# Patient Record
Sex: Female | Born: 1950 | Race: White | Hispanic: No | Marital: Married | Smoking: Current every day smoker
Health system: Southern US, Community
[De-identification: ages and names within clinical notes are randomized; demographics above are authoritative.]

## PROBLEM LIST (undated history)

## (undated) DIAGNOSIS — E785 Hyperlipidemia, unspecified: Secondary | ICD-10-CM

## (undated) DIAGNOSIS — I10 Essential (primary) hypertension: Secondary | ICD-10-CM

## (undated) DIAGNOSIS — C801 Malignant (primary) neoplasm, unspecified: Secondary | ICD-10-CM

## (undated) HISTORY — DX: Essential (primary) hypertension: I10

## (undated) HISTORY — DX: Hyperlipidemia, unspecified: E78.5

---

## 1997-07-13 ENCOUNTER — Other Ambulatory Visit: Admission: RE | Admit: 1997-07-13 | Discharge: 1997-07-13 | Payer: Self-pay | Admitting: Dermatology

## 1997-08-25 ENCOUNTER — Other Ambulatory Visit: Admission: RE | Admit: 1997-08-25 | Discharge: 1997-08-25 | Payer: Self-pay | Admitting: Obstetrics and Gynecology

## 1999-02-15 ENCOUNTER — Encounter: Payer: Self-pay | Admitting: Internal Medicine

## 1999-02-15 ENCOUNTER — Encounter: Admission: RE | Admit: 1999-02-15 | Discharge: 1999-02-15 | Payer: Self-pay | Admitting: Obstetrics and Gynecology

## 1999-02-15 ENCOUNTER — Encounter: Payer: Self-pay | Admitting: Obstetrics and Gynecology

## 2000-03-01 ENCOUNTER — Encounter: Payer: Self-pay | Admitting: Obstetrics and Gynecology

## 2000-03-01 ENCOUNTER — Encounter: Admission: RE | Admit: 2000-03-01 | Discharge: 2000-03-01 | Payer: Self-pay | Admitting: Obstetrics and Gynecology

## 2001-05-22 ENCOUNTER — Encounter: Payer: Self-pay | Admitting: Obstetrics and Gynecology

## 2001-05-22 ENCOUNTER — Encounter: Admission: RE | Admit: 2001-05-22 | Discharge: 2001-05-22 | Payer: Self-pay | Admitting: Obstetrics and Gynecology

## 2001-06-04 ENCOUNTER — Other Ambulatory Visit: Admission: RE | Admit: 2001-06-04 | Discharge: 2001-06-04 | Payer: Self-pay | Admitting: Gynecology

## 2002-05-27 ENCOUNTER — Encounter: Admission: RE | Admit: 2002-05-27 | Discharge: 2002-05-27 | Payer: Self-pay | Admitting: Gynecology

## 2002-05-27 ENCOUNTER — Encounter: Payer: Self-pay | Admitting: Gynecology

## 2002-06-03 ENCOUNTER — Other Ambulatory Visit: Admission: RE | Admit: 2002-06-03 | Discharge: 2002-06-03 | Payer: Self-pay | Admitting: Gynecology

## 2003-05-29 ENCOUNTER — Encounter: Admission: RE | Admit: 2003-05-29 | Discharge: 2003-05-29 | Payer: Self-pay | Admitting: Gynecology

## 2003-06-16 ENCOUNTER — Other Ambulatory Visit: Admission: RE | Admit: 2003-06-16 | Discharge: 2003-06-16 | Payer: Self-pay | Admitting: Gynecology

## 2004-06-17 ENCOUNTER — Encounter: Admission: RE | Admit: 2004-06-17 | Discharge: 2004-06-17 | Payer: Self-pay | Admitting: Internal Medicine

## 2004-07-05 ENCOUNTER — Other Ambulatory Visit: Admission: RE | Admit: 2004-07-05 | Discharge: 2004-07-05 | Payer: Self-pay | Admitting: Gynecology

## 2005-07-24 ENCOUNTER — Other Ambulatory Visit: Admission: RE | Admit: 2005-07-24 | Discharge: 2005-07-24 | Payer: Self-pay | Admitting: Gynecology

## 2005-07-26 ENCOUNTER — Ambulatory Visit (HOSPITAL_COMMUNITY): Admission: RE | Admit: 2005-07-26 | Discharge: 2005-07-26 | Payer: Self-pay | Admitting: Gynecology

## 2005-08-31 ENCOUNTER — Encounter: Admission: RE | Admit: 2005-08-31 | Discharge: 2005-08-31 | Payer: Self-pay | Admitting: Gynecology

## 2005-11-23 ENCOUNTER — Ambulatory Visit (HOSPITAL_COMMUNITY): Admission: RE | Admit: 2005-11-23 | Discharge: 2005-11-23 | Payer: Self-pay | Admitting: Gynecology

## 2006-07-02 ENCOUNTER — Ambulatory Visit (HOSPITAL_COMMUNITY): Admission: RE | Admit: 2006-07-02 | Discharge: 2006-07-02 | Payer: Self-pay | Admitting: Gynecology

## 2006-08-13 ENCOUNTER — Other Ambulatory Visit: Admission: RE | Admit: 2006-08-13 | Discharge: 2006-08-13 | Payer: Self-pay | Admitting: Gynecology

## 2006-10-08 ENCOUNTER — Encounter: Admission: RE | Admit: 2006-10-08 | Discharge: 2006-10-08 | Payer: Self-pay | Admitting: Gynecology

## 2007-01-07 ENCOUNTER — Ambulatory Visit (HOSPITAL_COMMUNITY): Admission: RE | Admit: 2007-01-07 | Discharge: 2007-01-07 | Payer: Self-pay | Admitting: Gynecology

## 2009-04-29 ENCOUNTER — Encounter: Admission: RE | Admit: 2009-04-29 | Discharge: 2009-04-29 | Payer: Self-pay | Admitting: Gynecology

## 2010-04-03 ENCOUNTER — Encounter: Payer: Self-pay | Admitting: Gynecology

## 2012-02-23 ENCOUNTER — Other Ambulatory Visit: Payer: Self-pay | Admitting: Gynecology

## 2012-02-23 DIAGNOSIS — Z1231 Encounter for screening mammogram for malignant neoplasm of breast: Secondary | ICD-10-CM

## 2012-04-01 ENCOUNTER — Ambulatory Visit: Payer: Self-pay

## 2013-07-17 DIAGNOSIS — G4700 Insomnia, unspecified: Secondary | ICD-10-CM | POA: Insufficient documentation

## 2013-10-03 DIAGNOSIS — J309 Allergic rhinitis, unspecified: Secondary | ICD-10-CM | POA: Insufficient documentation

## 2013-10-03 DIAGNOSIS — I10 Essential (primary) hypertension: Secondary | ICD-10-CM | POA: Insufficient documentation

## 2013-10-03 DIAGNOSIS — K219 Gastro-esophageal reflux disease without esophagitis: Secondary | ICD-10-CM | POA: Insufficient documentation

## 2013-10-03 DIAGNOSIS — F17200 Nicotine dependence, unspecified, uncomplicated: Secondary | ICD-10-CM | POA: Insufficient documentation

## 2013-10-03 DIAGNOSIS — F419 Anxiety disorder, unspecified: Secondary | ICD-10-CM | POA: Insufficient documentation

## 2016-04-11 DIAGNOSIS — K5790 Diverticulosis of intestine, part unspecified, without perforation or abscess without bleeding: Secondary | ICD-10-CM | POA: Insufficient documentation

## 2016-10-02 DIAGNOSIS — E781 Pure hyperglyceridemia: Secondary | ICD-10-CM | POA: Diagnosis not present

## 2016-10-02 DIAGNOSIS — I10 Essential (primary) hypertension: Secondary | ICD-10-CM | POA: Diagnosis not present

## 2016-10-18 DIAGNOSIS — E785 Hyperlipidemia, unspecified: Secondary | ICD-10-CM | POA: Diagnosis not present

## 2016-10-18 DIAGNOSIS — I10 Essential (primary) hypertension: Secondary | ICD-10-CM | POA: Diagnosis not present

## 2016-10-18 DIAGNOSIS — D259 Leiomyoma of uterus, unspecified: Secondary | ICD-10-CM | POA: Diagnosis not present

## 2016-10-18 DIAGNOSIS — K5792 Diverticulitis of intestine, part unspecified, without perforation or abscess without bleeding: Secondary | ICD-10-CM | POA: Diagnosis not present

## 2016-10-18 DIAGNOSIS — K5732 Diverticulitis of large intestine without perforation or abscess without bleeding: Secondary | ICD-10-CM | POA: Diagnosis not present

## 2016-10-18 DIAGNOSIS — F17200 Nicotine dependence, unspecified, uncomplicated: Secondary | ICD-10-CM | POA: Diagnosis not present

## 2016-12-06 DIAGNOSIS — I700 Atherosclerosis of aorta: Secondary | ICD-10-CM | POA: Insufficient documentation

## 2016-12-06 DIAGNOSIS — K5730 Diverticulosis of large intestine without perforation or abscess without bleeding: Secondary | ICD-10-CM | POA: Diagnosis not present

## 2016-12-06 DIAGNOSIS — E785 Hyperlipidemia, unspecified: Secondary | ICD-10-CM | POA: Insufficient documentation

## 2016-12-28 DIAGNOSIS — K5730 Diverticulosis of large intestine without perforation or abscess without bleeding: Secondary | ICD-10-CM | POA: Diagnosis not present

## 2016-12-28 DIAGNOSIS — Z8719 Personal history of other diseases of the digestive system: Secondary | ICD-10-CM | POA: Diagnosis not present

## 2016-12-28 DIAGNOSIS — Z1211 Encounter for screening for malignant neoplasm of colon: Secondary | ICD-10-CM | POA: Diagnosis not present

## 2017-01-07 DIAGNOSIS — F17210 Nicotine dependence, cigarettes, uncomplicated: Secondary | ICD-10-CM | POA: Diagnosis not present

## 2017-01-07 DIAGNOSIS — E785 Hyperlipidemia, unspecified: Secondary | ICD-10-CM | POA: Diagnosis not present

## 2017-01-07 DIAGNOSIS — M1612 Unilateral primary osteoarthritis, left hip: Secondary | ICD-10-CM | POA: Diagnosis not present

## 2017-01-07 DIAGNOSIS — M25552 Pain in left hip: Secondary | ICD-10-CM | POA: Diagnosis not present

## 2017-01-07 DIAGNOSIS — M47818 Spondylosis without myelopathy or radiculopathy, sacral and sacrococcygeal region: Secondary | ICD-10-CM | POA: Diagnosis not present

## 2017-01-07 DIAGNOSIS — M419 Scoliosis, unspecified: Secondary | ICD-10-CM | POA: Diagnosis not present

## 2017-01-07 DIAGNOSIS — Z7982 Long term (current) use of aspirin: Secondary | ICD-10-CM | POA: Diagnosis not present

## 2017-01-07 DIAGNOSIS — M5136 Other intervertebral disc degeneration, lumbar region: Secondary | ICD-10-CM | POA: Diagnosis not present

## 2017-01-07 DIAGNOSIS — I10 Essential (primary) hypertension: Secondary | ICD-10-CM | POA: Diagnosis not present

## 2017-01-30 DIAGNOSIS — M816 Localized osteoporosis [Lequesne]: Secondary | ICD-10-CM | POA: Diagnosis not present

## 2017-01-30 DIAGNOSIS — N958 Other specified menopausal and perimenopausal disorders: Secondary | ICD-10-CM | POA: Diagnosis not present

## 2017-01-30 DIAGNOSIS — Z01419 Encounter for gynecological examination (general) (routine) without abnormal findings: Secondary | ICD-10-CM | POA: Diagnosis not present

## 2017-01-30 DIAGNOSIS — Z1231 Encounter for screening mammogram for malignant neoplasm of breast: Secondary | ICD-10-CM | POA: Diagnosis not present

## 2017-01-30 DIAGNOSIS — Z6826 Body mass index (BMI) 26.0-26.9, adult: Secondary | ICD-10-CM | POA: Diagnosis not present

## 2017-03-21 DIAGNOSIS — D252 Subserosal leiomyoma of uterus: Secondary | ICD-10-CM | POA: Diagnosis not present

## 2017-03-21 DIAGNOSIS — D251 Intramural leiomyoma of uterus: Secondary | ICD-10-CM | POA: Diagnosis not present

## 2017-04-04 DIAGNOSIS — D225 Melanocytic nevi of trunk: Secondary | ICD-10-CM | POA: Diagnosis not present

## 2017-04-04 DIAGNOSIS — L821 Other seborrheic keratosis: Secondary | ICD-10-CM | POA: Diagnosis not present

## 2017-04-04 DIAGNOSIS — G4700 Insomnia, unspecified: Secondary | ICD-10-CM | POA: Diagnosis not present

## 2017-04-04 DIAGNOSIS — L820 Inflamed seborrheic keratosis: Secondary | ICD-10-CM | POA: Diagnosis not present

## 2017-04-04 DIAGNOSIS — F17200 Nicotine dependence, unspecified, uncomplicated: Secondary | ICD-10-CM | POA: Diagnosis not present

## 2017-04-04 DIAGNOSIS — L409 Psoriasis, unspecified: Secondary | ICD-10-CM | POA: Diagnosis not present

## 2017-04-04 DIAGNOSIS — Z85828 Personal history of other malignant neoplasm of skin: Secondary | ICD-10-CM | POA: Diagnosis not present

## 2017-04-04 DIAGNOSIS — I10 Essential (primary) hypertension: Secondary | ICD-10-CM | POA: Diagnosis not present

## 2017-04-04 DIAGNOSIS — Z23 Encounter for immunization: Secondary | ICD-10-CM | POA: Diagnosis not present

## 2017-04-04 DIAGNOSIS — E785 Hyperlipidemia, unspecified: Secondary | ICD-10-CM | POA: Diagnosis not present

## 2017-04-04 DIAGNOSIS — D1801 Hemangioma of skin and subcutaneous tissue: Secondary | ICD-10-CM | POA: Diagnosis not present

## 2017-04-04 DIAGNOSIS — L814 Other melanin hyperpigmentation: Secondary | ICD-10-CM | POA: Diagnosis not present

## 2017-06-26 DIAGNOSIS — E559 Vitamin D deficiency, unspecified: Secondary | ICD-10-CM | POA: Diagnosis not present

## 2017-08-07 DIAGNOSIS — M5136 Other intervertebral disc degeneration, lumbar region: Secondary | ICD-10-CM | POA: Diagnosis not present

## 2017-08-07 DIAGNOSIS — M1612 Unilateral primary osteoarthritis, left hip: Secondary | ICD-10-CM | POA: Diagnosis not present

## 2017-08-20 DIAGNOSIS — R399 Unspecified symptoms and signs involving the genitourinary system: Secondary | ICD-10-CM | POA: Diagnosis not present

## 2017-08-29 DIAGNOSIS — J449 Chronic obstructive pulmonary disease, unspecified: Secondary | ICD-10-CM | POA: Diagnosis not present

## 2017-08-29 DIAGNOSIS — E876 Hypokalemia: Secondary | ICD-10-CM | POA: Diagnosis not present

## 2017-08-29 DIAGNOSIS — E8770 Fluid overload, unspecified: Secondary | ICD-10-CM | POA: Diagnosis not present

## 2017-08-29 DIAGNOSIS — Z79899 Other long term (current) drug therapy: Secondary | ICD-10-CM | POA: Diagnosis not present

## 2017-08-29 DIAGNOSIS — F17210 Nicotine dependence, cigarettes, uncomplicated: Secondary | ICD-10-CM | POA: Diagnosis not present

## 2017-08-29 DIAGNOSIS — J9601 Acute respiratory failure with hypoxia: Secondary | ICD-10-CM | POA: Diagnosis not present

## 2017-08-29 DIAGNOSIS — R6511 Systemic inflammatory response syndrome (SIRS) of non-infectious origin with acute organ dysfunction: Secondary | ICD-10-CM | POA: Diagnosis not present

## 2017-08-29 DIAGNOSIS — E889 Metabolic disorder, unspecified: Secondary | ICD-10-CM | POA: Diagnosis not present

## 2017-08-29 DIAGNOSIS — R000 Tachycardia, unspecified: Secondary | ICD-10-CM | POA: Diagnosis not present

## 2017-08-29 DIAGNOSIS — E7800 Pure hypercholesterolemia, unspecified: Secondary | ICD-10-CM | POA: Diagnosis not present

## 2017-08-29 DIAGNOSIS — E871 Hypo-osmolality and hyponatremia: Secondary | ICD-10-CM | POA: Diagnosis not present

## 2017-08-29 DIAGNOSIS — R062 Wheezing: Secondary | ICD-10-CM | POA: Diagnosis not present

## 2017-08-29 DIAGNOSIS — E860 Dehydration: Secondary | ICD-10-CM | POA: Diagnosis not present

## 2017-08-29 DIAGNOSIS — F10239 Alcohol dependence with withdrawal, unspecified: Secondary | ICD-10-CM | POA: Diagnosis not present

## 2017-08-29 DIAGNOSIS — A419 Sepsis, unspecified organism: Secondary | ICD-10-CM | POA: Diagnosis not present

## 2017-08-29 DIAGNOSIS — N390 Urinary tract infection, site not specified: Secondary | ICD-10-CM | POA: Diagnosis not present

## 2017-08-29 DIAGNOSIS — D696 Thrombocytopenia, unspecified: Secondary | ICD-10-CM | POA: Diagnosis not present

## 2017-08-29 DIAGNOSIS — I361 Nonrheumatic tricuspid (valve) insufficiency: Secondary | ICD-10-CM | POA: Diagnosis not present

## 2017-08-29 DIAGNOSIS — Z90721 Acquired absence of ovaries, unilateral: Secondary | ICD-10-CM | POA: Diagnosis not present

## 2017-08-29 DIAGNOSIS — Z299 Encounter for prophylactic measures, unspecified: Secondary | ICD-10-CM | POA: Diagnosis not present

## 2017-08-29 DIAGNOSIS — I10 Essential (primary) hypertension: Secondary | ICD-10-CM | POA: Diagnosis not present

## 2017-08-29 DIAGNOSIS — R7989 Other specified abnormal findings of blood chemistry: Secondary | ICD-10-CM | POA: Diagnosis not present

## 2017-08-29 DIAGNOSIS — R0602 Shortness of breath: Secondary | ICD-10-CM | POA: Diagnosis not present

## 2017-09-01 DIAGNOSIS — J9611 Chronic respiratory failure with hypoxia: Secondary | ICD-10-CM | POA: Insufficient documentation

## 2017-09-03 DIAGNOSIS — R000 Tachycardia, unspecified: Secondary | ICD-10-CM | POA: Diagnosis not present

## 2017-09-06 ENCOUNTER — Other Ambulatory Visit: Payer: Self-pay | Admitting: *Deleted

## 2017-09-06 ENCOUNTER — Encounter: Payer: Self-pay | Admitting: *Deleted

## 2017-09-06 NOTE — Patient Outreach (Signed)
[place] [facility]) Care Management  09/06/2017  JILLISA HARRIS December 21, 1950 [id number]  Referral via [place]; member discharged from [place] 09/02/2017;  Per hx: Admission 6/19-08/2017 Dx: Hyponatremia, UTI  Telephone call attempt x 1-phones rings-unable to leave message; alternate number called; unable to leave message.  Plan: Passenger transport manager. Follow up 2-4 days.  Sherrin Daisy, RN BSN [place] Management Coordinator [facility] Care Management  [phone]

## 2017-09-07 DIAGNOSIS — D72819 Decreased white blood cell count, unspecified: Secondary | ICD-10-CM | POA: Diagnosis not present

## 2017-09-07 DIAGNOSIS — D696 Thrombocytopenia, unspecified: Secondary | ICD-10-CM | POA: Diagnosis not present

## 2017-09-07 DIAGNOSIS — E871 Hypo-osmolality and hyponatremia: Secondary | ICD-10-CM | POA: Diagnosis not present

## 2017-09-07 DIAGNOSIS — E876 Hypokalemia: Secondary | ICD-10-CM | POA: Diagnosis not present

## 2017-09-11 ENCOUNTER — Other Ambulatory Visit: Payer: Self-pay | Admitting: *Deleted

## 2017-09-11 NOTE — Patient Outreach (Signed)
[place] [facility]) Care Management  09/11/2017  Beverly [name] 12-12-1950 [id number]  Referral via [place]; member discharged from [place] 09/02/2017;  Per hx: Admission 6/19-08/2017 Dx: Hyponatremia, UTI  TOC/Telephone call #2 attempt; phone rings with no answer; unable to leave message.  Plan: Will follow up 2-4 business days. Outreach letter was sent 09/06/17.  Sherrin Daisy, RN BSN [place] Management Coordinator [facility] Care Management  [phone]

## 2017-09-18 ENCOUNTER — Other Ambulatory Visit: Payer: Self-pay | Admitting: *Deleted

## 2017-09-18 DIAGNOSIS — J209 Acute bronchitis, unspecified: Secondary | ICD-10-CM | POA: Diagnosis not present

## 2017-09-18 NOTE — Patient Outreach (Addendum)
[place] [facility] Care Management  09/18/2017  Beverly [name] [birth year] [id number]   Referral via [place]; member discharged from [place] 09/02/2017;  Per hx: Admission 6/19-08/2017 Dx: Hyponatremia, UTI  Telephone call attempt x 3, no answer & unable to leave message. Alternate number called; left message requesting call back.  Plan: Outreach letter was sent 09/06/2017. Will follow up 2-4 days.  Sherrin Daisy, RN BSN [place] Management Coordinator [facility] Care Management  [phone]

## 2017-09-24 ENCOUNTER — Other Ambulatory Visit: Payer: Self-pay | Admitting: *Deleted

## 2017-09-24 NOTE — Patient Outreach (Signed)
[place] [facility]) Care Management  09/24/2017  Beverly [name] 1950/05/19 [id number]  Unsuccessful call attempts. No response to outreach letter from patient.  Plan: Care closure.  Sherrin Daisy, RN BSN [place] Management Coordinator [facility] Care Management  [phone]

## 2017-10-03 DIAGNOSIS — I10 Essential (primary) hypertension: Secondary | ICD-10-CM | POA: Diagnosis not present

## 2017-10-03 DIAGNOSIS — F17200 Nicotine dependence, unspecified, uncomplicated: Secondary | ICD-10-CM | POA: Diagnosis not present

## 2017-10-03 DIAGNOSIS — E785 Hyperlipidemia, unspecified: Secondary | ICD-10-CM | POA: Diagnosis not present

## 2017-10-03 DIAGNOSIS — Z23 Encounter for immunization: Secondary | ICD-10-CM | POA: Diagnosis not present

## 2017-10-03 DIAGNOSIS — F4322 Adjustment disorder with anxiety: Secondary | ICD-10-CM | POA: Diagnosis not present

## 2017-10-03 DIAGNOSIS — G4700 Insomnia, unspecified: Secondary | ICD-10-CM | POA: Diagnosis not present

## 2017-10-04 DIAGNOSIS — D473 Essential (hemorrhagic) thrombocythemia: Secondary | ICD-10-CM | POA: Insufficient documentation

## 2017-10-04 DIAGNOSIS — D75839 Thrombocytosis, unspecified: Secondary | ICD-10-CM | POA: Insufficient documentation

## 2017-10-04 DIAGNOSIS — E871 Hypo-osmolality and hyponatremia: Secondary | ICD-10-CM | POA: Insufficient documentation

## 2017-10-05 DIAGNOSIS — R05 Cough: Secondary | ICD-10-CM | POA: Diagnosis not present

## 2017-10-05 DIAGNOSIS — R0600 Dyspnea, unspecified: Secondary | ICD-10-CM | POA: Diagnosis not present

## 2017-10-05 DIAGNOSIS — J209 Acute bronchitis, unspecified: Secondary | ICD-10-CM | POA: Diagnosis not present

## 2017-10-05 DIAGNOSIS — E871 Hypo-osmolality and hyponatremia: Secondary | ICD-10-CM | POA: Diagnosis not present

## 2017-10-29 DIAGNOSIS — J449 Chronic obstructive pulmonary disease, unspecified: Secondary | ICD-10-CM | POA: Diagnosis not present

## 2017-10-29 DIAGNOSIS — F17200 Nicotine dependence, unspecified, uncomplicated: Secondary | ICD-10-CM | POA: Diagnosis not present

## 2017-10-29 DIAGNOSIS — J9601 Acute respiratory failure with hypoxia: Secondary | ICD-10-CM | POA: Diagnosis not present

## 2017-10-29 DIAGNOSIS — E871 Hypo-osmolality and hyponatremia: Secondary | ICD-10-CM | POA: Diagnosis not present

## 2017-11-01 DIAGNOSIS — D259 Leiomyoma of uterus, unspecified: Secondary | ICD-10-CM | POA: Diagnosis not present

## 2017-11-15 DIAGNOSIS — E871 Hypo-osmolality and hyponatremia: Secondary | ICD-10-CM | POA: Diagnosis not present

## 2017-11-15 DIAGNOSIS — R918 Other nonspecific abnormal finding of lung field: Secondary | ICD-10-CM | POA: Diagnosis not present

## 2017-11-15 DIAGNOSIS — I700 Atherosclerosis of aorta: Secondary | ICD-10-CM | POA: Diagnosis not present

## 2017-11-15 DIAGNOSIS — J449 Chronic obstructive pulmonary disease, unspecified: Secondary | ICD-10-CM | POA: Diagnosis not present

## 2017-11-15 DIAGNOSIS — I77810 Thoracic aortic ectasia: Secondary | ICD-10-CM | POA: Diagnosis not present

## 2017-11-27 DIAGNOSIS — F17210 Nicotine dependence, cigarettes, uncomplicated: Secondary | ICD-10-CM | POA: Diagnosis not present

## 2017-11-27 DIAGNOSIS — J9601 Acute respiratory failure with hypoxia: Secondary | ICD-10-CM | POA: Diagnosis not present

## 2017-11-27 DIAGNOSIS — J449 Chronic obstructive pulmonary disease, unspecified: Secondary | ICD-10-CM | POA: Diagnosis not present

## 2018-01-03 DIAGNOSIS — M5136 Other intervertebral disc degeneration, lumbar region: Secondary | ICD-10-CM | POA: Insufficient documentation

## 2018-01-03 DIAGNOSIS — M5416 Radiculopathy, lumbar region: Secondary | ICD-10-CM | POA: Diagnosis not present

## 2018-01-25 DIAGNOSIS — M1612 Unilateral primary osteoarthritis, left hip: Secondary | ICD-10-CM | POA: Diagnosis not present

## 2018-01-31 DIAGNOSIS — D473 Essential (hemorrhagic) thrombocythemia: Secondary | ICD-10-CM | POA: Diagnosis not present

## 2018-01-31 DIAGNOSIS — E871 Hypo-osmolality and hyponatremia: Secondary | ICD-10-CM | POA: Diagnosis not present

## 2018-01-31 DIAGNOSIS — Z79899 Other long term (current) drug therapy: Secondary | ICD-10-CM | POA: Diagnosis not present

## 2018-02-01 DIAGNOSIS — E871 Hypo-osmolality and hyponatremia: Secondary | ICD-10-CM | POA: Diagnosis not present

## 2018-02-01 DIAGNOSIS — E538 Deficiency of other specified B group vitamins: Secondary | ICD-10-CM | POA: Insufficient documentation

## 2018-02-14 DIAGNOSIS — M1612 Unilateral primary osteoarthritis, left hip: Secondary | ICD-10-CM | POA: Diagnosis not present

## 2018-03-04 DIAGNOSIS — I10 Essential (primary) hypertension: Secondary | ICD-10-CM | POA: Diagnosis not present

## 2018-03-04 DIAGNOSIS — N3000 Acute cystitis without hematuria: Secondary | ICD-10-CM | POA: Diagnosis not present

## 2018-03-04 DIAGNOSIS — R300 Dysuria: Secondary | ICD-10-CM | POA: Diagnosis not present

## 2018-03-04 DIAGNOSIS — E871 Hypo-osmolality and hyponatremia: Secondary | ICD-10-CM | POA: Diagnosis not present

## 2018-03-21 DIAGNOSIS — H02413 Mechanical ptosis of bilateral eyelids: Secondary | ICD-10-CM | POA: Diagnosis not present

## 2018-03-21 DIAGNOSIS — H53483 Generalized contraction of visual field, bilateral: Secondary | ICD-10-CM | POA: Diagnosis not present

## 2018-03-21 DIAGNOSIS — H0279 Other degenerative disorders of eyelid and periocular area: Secondary | ICD-10-CM | POA: Diagnosis not present

## 2018-03-21 DIAGNOSIS — H02834 Dermatochalasis of left upper eyelid: Secondary | ICD-10-CM | POA: Diagnosis not present

## 2018-03-21 DIAGNOSIS — H02831 Dermatochalasis of right upper eyelid: Secondary | ICD-10-CM | POA: Diagnosis not present

## 2018-03-21 DIAGNOSIS — Z1231 Encounter for screening mammogram for malignant neoplasm of breast: Secondary | ICD-10-CM | POA: Diagnosis not present

## 2018-03-21 DIAGNOSIS — Z124 Encounter for screening for malignant neoplasm of cervix: Secondary | ICD-10-CM | POA: Diagnosis not present

## 2018-03-21 DIAGNOSIS — Z6827 Body mass index (BMI) 27.0-27.9, adult: Secondary | ICD-10-CM | POA: Diagnosis not present

## 2018-03-25 DIAGNOSIS — Z716 Tobacco abuse counseling: Secondary | ICD-10-CM | POA: Diagnosis not present

## 2018-03-25 DIAGNOSIS — M1612 Unilateral primary osteoarthritis, left hip: Secondary | ICD-10-CM | POA: Diagnosis not present

## 2018-04-04 DIAGNOSIS — M545 Low back pain: Secondary | ICD-10-CM | POA: Diagnosis not present

## 2018-04-05 DIAGNOSIS — E871 Hypo-osmolality and hyponatremia: Secondary | ICD-10-CM | POA: Diagnosis not present

## 2018-04-08 DIAGNOSIS — M5416 Radiculopathy, lumbar region: Secondary | ICD-10-CM | POA: Diagnosis not present

## 2018-04-08 DIAGNOSIS — D473 Essential (hemorrhagic) thrombocythemia: Secondary | ICD-10-CM | POA: Diagnosis not present

## 2018-04-08 DIAGNOSIS — E785 Hyperlipidemia, unspecified: Secondary | ICD-10-CM | POA: Diagnosis not present

## 2018-04-08 DIAGNOSIS — R1310 Dysphagia, unspecified: Secondary | ICD-10-CM | POA: Diagnosis not present

## 2018-04-08 DIAGNOSIS — F4322 Adjustment disorder with anxiety: Secondary | ICD-10-CM | POA: Diagnosis not present

## 2018-04-08 DIAGNOSIS — Z2821 Immunization not carried out because of patient refusal: Secondary | ICD-10-CM | POA: Diagnosis not present

## 2018-04-08 DIAGNOSIS — G4700 Insomnia, unspecified: Secondary | ICD-10-CM | POA: Diagnosis not present

## 2018-04-08 DIAGNOSIS — I10 Essential (primary) hypertension: Secondary | ICD-10-CM | POA: Diagnosis not present

## 2018-04-08 DIAGNOSIS — E538 Deficiency of other specified B group vitamins: Secondary | ICD-10-CM | POA: Diagnosis not present

## 2018-04-08 DIAGNOSIS — Z0000 Encounter for general adult medical examination without abnormal findings: Secondary | ICD-10-CM | POA: Diagnosis not present

## 2018-04-09 DIAGNOSIS — L409 Psoriasis, unspecified: Secondary | ICD-10-CM | POA: Diagnosis not present

## 2018-04-09 DIAGNOSIS — L821 Other seborrheic keratosis: Secondary | ICD-10-CM | POA: Diagnosis not present

## 2018-04-09 DIAGNOSIS — Z23 Encounter for immunization: Secondary | ICD-10-CM | POA: Diagnosis not present

## 2018-04-09 DIAGNOSIS — D225 Melanocytic nevi of trunk: Secondary | ICD-10-CM | POA: Diagnosis not present

## 2018-04-09 DIAGNOSIS — Z85828 Personal history of other malignant neoplasm of skin: Secondary | ICD-10-CM | POA: Diagnosis not present

## 2018-04-09 DIAGNOSIS — L814 Other melanin hyperpigmentation: Secondary | ICD-10-CM | POA: Diagnosis not present

## 2018-04-09 DIAGNOSIS — L820 Inflamed seborrheic keratosis: Secondary | ICD-10-CM | POA: Diagnosis not present

## 2018-04-11 ENCOUNTER — Ambulatory Visit (INDEPENDENT_AMBULATORY_CARE_PROVIDER_SITE_OTHER): Payer: PPO | Admitting: Family Medicine

## 2018-04-11 ENCOUNTER — Other Ambulatory Visit: Payer: Self-pay | Admitting: Family Medicine

## 2018-04-11 VITALS — BP 136/75 | Ht 62.0 in | Wt 143.0 lb

## 2018-04-11 DIAGNOSIS — M5416 Radiculopathy, lumbar region: Secondary | ICD-10-CM

## 2018-04-11 NOTE — Patient Instructions (Signed)
We will set up an injection for you for left L3 radiculopathy, spinal stenosis. Continue the prednisone until this is gone. Call me a week after the injection to let me know how you're doing.

## 2018-04-15 ENCOUNTER — Encounter: Payer: Self-pay | Admitting: Family Medicine

## 2018-04-15 NOTE — Progress Notes (Signed)
PCP: Thomes Dinning, MD  Subjective:   HPI: Patient is a 68 y.o. female here for low back/left hip pain.  Patient reports her pain dates back to October 2018. She was dancing and also went bowling, developed pain in left side of low back radiating around to left upper leg, thigh, hip. States she had a hip injection that did not provide benefit. No pain past the knee. No bowel/bladder dysfunction. No pain into right leg. No numbness or tingling. Pain currently 0/10 but sharp and more severe with walking. Better when walking while back is flexed.  History reviewed. No pertinent past medical history.  Current Outpatient Medications on File Prior to Visit  Medication Sig Dispense Refill  . albuterol (PROAIR HFA) 108 (90 Base) MCG/ACT inhaler ProAir HFA 90 mcg/actuation aerosol inhaler    . atorvastatin (LIPITOR) 20 MG tablet     . Cholecalciferol (VITAMIN D3) 50 MCG (2000 UT) TABS Take by mouth.    . ezetimibe (ZETIA) 10 MG tablet     . fenofibrate 160 MG tablet     . losartan (COZAAR) 100 MG tablet     . meloxicam (MOBIC) 7.5 MG tablet meloxicam 7.5 mg tablet    . metoprolol succinate (TOPROL-XL) 100 MG 24 hr tablet     . predniSONE (STERAPRED UNI-PAK 21 TAB) 5 MG (21) TBPK tablet     . venlafaxine XR (EFFEXOR-XR) 37.5 MG 24 hr capsule venlafaxine ER 37.5 mg capsule,extended release 24 hr    . vitamin B-12 (CYANOCOBALAMIN) 1000 MCG tablet Take 1,000 mcg by mouth daily.    Marland Kitchen ADVAIR HFA 45-21 MCG/ACT inhaler     . alendronate (FOSAMAX) 70 MG tablet     . gabapentin (NEURONTIN) 100 MG capsule      No current facility-administered medications on file prior to visit.     History reviewed. No pertinent surgical history.  No Known Allergies  Social History   Socioeconomic History  . Marital status: Married    Spouse name: Not on file  . Number of children: Not on file  . Years of education: Not on file  . Highest education level: Not on file  Occupational History  . Not  on file  Social Needs  . Financial resource strain: Not on file  . Food insecurity:    Worry: Not on file    Inability: Not on file  . Transportation needs:    Medical: Not on file    Non-medical: Not on file  Tobacco Use  . Smoking status: Current Every Day Smoker  . Smokeless tobacco: Never Used  Substance and Sexual Activity  . Alcohol use: Not on file  . Drug use: Not on file  . Sexual activity: Not on file  Lifestyle  . Physical activity:    Days per week: Not on file    Minutes per session: Not on file  . Stress: Not on file  Relationships  . Social connections:    Talks on phone: Not on file    Gets together: Not on file    Attends religious service: Not on file    Active member of club or organization: Not on file    Attends meetings of clubs or organizations: Not on file    Relationship status: Not on file  . Intimate partner violence:    Fear of current or ex partner: Not on file    Emotionally abused: Not on file    Physically abused: Not on file    Forced sexual  activity: Not on file  Other Topics Concern  . Not on file  Social History Narrative  . Not on file    History reviewed. No pertinent family history.  BP 136/75   Ht 5\' 2"  (1.575 m)   Wt 143 lb (64.9 kg)   BMI 26.16 kg/m   Review of Systems: See HPI above.     Objective:  Physical Exam:  Gen: NAD, comfortable in exam room  Back: No gross deformity, scoliosis. No paraspinal TTP .  No midline or bony TTP. FROM with pain on extension > flexion. Strength LEs 5/5 all muscle groups. 2+ MSRs in patellar and achilles tendons, equal bilaterally. Negative SLRs. Sensation intact to light touch bilaterally.  Left hip: No deformity. FROM with 5/5 strength. Mild TTP bilateral greater trochanters.  No other tenderness. NVI distally. Negative logroll bilateral hips Negative fabers and piriformis stretches.   Assessment & Plan:  1. Low back pain with radiation into left hip - patient's  history, exam, lack of response to intraarticular hip injection all suggestive of her spinal stenosis, left L3 radiculopathy being cause of her pain.  Will set her up for an ESI on the left in this area.  She is on prednisone currently - will take this to completion.  Call us to let us know how she's doing a week after injection.

## 2018-04-16 ENCOUNTER — Encounter: Payer: Self-pay | Admitting: Physical Medicine and Rehabilitation

## 2018-04-17 ENCOUNTER — Ambulatory Visit
Admission: RE | Admit: 2018-04-17 | Discharge: 2018-04-17 | Disposition: A | Discharge status: Home / Self Care | Payer: PPO | Source: Ambulatory Visit | Attending: Family Medicine | Admitting: Family Medicine

## 2018-04-17 ENCOUNTER — Other Ambulatory Visit: Payer: Self-pay | Admitting: Family Medicine

## 2018-04-17 DIAGNOSIS — M5416 Radiculopathy, lumbar region: Secondary | ICD-10-CM

## 2018-04-17 DIAGNOSIS — M5116 Intervertebral disc disorders with radiculopathy, lumbar region: Secondary | ICD-10-CM | POA: Diagnosis not present

## 2018-04-17 MED ORDER — IOPAMIDOL (ISOVUE-M 200) INJECTION 41%
1.0000 mL | Freq: Once | INTRAMUSCULAR | Status: AC
  Administered 2018-04-17: 1 mL via EPIDURAL

## 2018-04-17 MED ORDER — METHYLPREDNISOLONE ACETATE 40 MG/ML INJ SUSP (RADIOLOG
120.0000 mg | Freq: Once | INTRAMUSCULAR | Status: AC
  Administered 2018-04-17: 120 mg via EPIDURAL

## 2018-04-17 NOTE — Discharge Instructions (Signed)

## 2018-04-25 NOTE — Addendum Note (Signed)
Addended by: Sherrie George F on: 04/25/2018 12:09 PM   Modules accepted: Orders

## 2018-04-30 ENCOUNTER — Other Ambulatory Visit: Payer: Self-pay | Admitting: Family Medicine

## 2018-04-30 ENCOUNTER — Ambulatory Visit: Payer: PPO | Attending: Family Medicine | Admitting: Physical Therapy

## 2018-04-30 ENCOUNTER — Other Ambulatory Visit: Payer: Self-pay

## 2018-04-30 DIAGNOSIS — M25652 Stiffness of left hip, not elsewhere classified: Secondary | ICD-10-CM | POA: Diagnosis not present

## 2018-04-30 DIAGNOSIS — M25552 Pain in left hip: Secondary | ICD-10-CM

## 2018-04-30 DIAGNOSIS — G8929 Other chronic pain: Secondary | ICD-10-CM | POA: Diagnosis not present

## 2018-04-30 DIAGNOSIS — M545 Low back pain, unspecified: Secondary | ICD-10-CM

## 2018-04-30 DIAGNOSIS — M5416 Radiculopathy, lumbar region: Secondary | ICD-10-CM

## 2018-04-30 NOTE — Patient Instructions (Signed)
Access Code: [email or web address]  URL: [email or web address]  Date: 04/30/2018  Prepared by: Madelyn Flavors   Exercises  Standing 'L' Stretch at Counter - 2 reps - 1 sets - 30-60 sec hold - 2x daily - 7x weekly  Seated Table Hamstring Stretch - 3 reps - 1 sets - 30-60 sec hold - 2x daily - 7x weekly

## 2018-04-30 NOTE — Therapy (Signed)
[place], [place], [address] Phone: [phone]   Fax:  [phone]  Physical Therapy Evaluation  Patient Details  Name: Beverly [name] MRN: [id number] Date of Birth: 22-May-1950 Referring Provider (PT): Karlton Lemon   Encounter Date: 04/30/2018  PT End of Session - 04/30/18 1303    Visit Number  1    Date for PT Re-Evaluation  06/25/18    PT Start Time  1303    PT Stop Time  1346    PT Time Calculation (min)  43 min    Activity Tolerance  Patient tolerated treatment well    Behavior During Therapy  [facility] for tasks assessed/performed       History reviewed. No pertinent past medical history.  History reviewed. No pertinent surgical history.  There were no vitals filed for this visit.   Subjective Assessment - 04/30/18 1305    Subjective  Patient began experiencing low back and LLE pain in Oct 2018. Pain has progressively worsented. She had two steroid shots, the latest 04/17/18. She is scheduled to have another one 05/06/18. Her pain is mainly left ant hip.     Pertinent History  HTN, OP, arthritis, stenosis lumbar spine    How long can you walk comfortably?  not at all    Diagnostic tests  L3/4 stenosis with left L4 nerve impingement; stenosis L1-L5; left hip bone on bone (per pt)    Patient Stated Goals  to decrease pain    Currently in Pain?  Yes    Pain Score  5     Pain Location  Hip    Pain Orientation  Left    Pain Descriptors / Indicators  Sharp;Dull    Pain Type  Chronic pain    Pain Onset  More than a month ago    Pain Frequency  Intermittent    Aggravating Factors   walking    Pain Relieving Factors  sitting         [facility] PT Assessment - 04/30/18 0001      Assessment   Medical Diagnosis  left lumbar radiculopathy    Referring Provider (PT)  Karlton Lemon    Onset Date/Surgical Date  12/11/16    Next MD Visit  none scheduled      Precautions   Precautions  None       Restrictions   Weight Bearing Restrictions  No      Balance Screen   Has the patient fallen in the past 6 months  Yes   dog knocked her down on grass   How many times?  1    Has the patient had a decrease in activity level because of a fear of falling?   No    Is the patient reluctant to leave their home because of a fear of falling?   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    Leisure  travel and walk her dog daily      Posture/Postural Control   Posture/Postural Control  Postural limitations    Postural Limitations  Anterior pelvic tilt    Posture Comments  decreased WB on LLE  ROM / Strength   AROM / PROM / Strength  AROM;Strength      AROM   Overall AROM Comments  lumbar WFL all planes; increased pain into left thigh with left SB      Strength   Overall Strength Comments  bil hip flex 4-/5, left hip ext 4-/5, right 4+/5, ABD 4/5; Bil knee ext 5/5, flex 4+/5      Flexibility   Soft Tissue Assessment /Muscle Length  yes    Hamstrings  bil marked    Quadriceps  WFL    ITB  WNL left    Piriformis  WNL    Levator Ani  left hip ADDuctors      Palpation   Palpation comment  left gluteals mod tenderness; marked left hip ADDuctors      Special Tests   Other special tests  Pos hip scour and faber on left; Rt WNL      Ambulation/Gait   Ambulation/Gait  Yes    Ambulation/Gait Assistance  7: Independent   uses a cane to go to mailbox due to hill   Ambulation Distance (Feet)  30 Feet    Assistive device  None    Gait Pattern  Step-to pattern;Decreased step length - left;Decreased stance time - right;Decreased stance time - left;Decreased stride length;Decreased weight shift to left                Objective measurements completed on  examination: See above findings.                PT Short Term Goals - 04/30/18 1556      PT SHORT TERM GOAL #1   Title  Ind with inital HEP    Time  2    Period  Weeks    Status  New    Target Date  05/14/18        PT Long Term Goals - 04/30/18 1557      PT LONG TERM GOAL #1   Title  Patient to report decreased pain in her left hip by 50% or more with ADLs.    Time  8    Period  Weeks    Status  New      PT LONG TERM GOAL #2   Title  Patient able to ambulate with a normal gait pattern with pain <= 2/10.    Time  8    Period  Weeks    Status  New      PT LONG TERM GOAL #3   Title  Patient to demo 4+ to 5/5 BLE strength to ease ADLs.     Time  8    Period  Weeks    Status  New      PT LONG TERM GOAL #4   Title  Patient able to ambulate safely to and from her mailbox without AD.    Time  8    Period  Weeks    Status  New             Plan - 04/30/18 1547    Clinical Impression Statement  Patient began experiencing low back and LLE pain in Oct 2018. Pain has progressively worsented. She had two steroid shots, the latest 04/17/18. She is scheduled to have another one 05/06/18. Her pain is mainly left ant hip. She has significant tighness in her left hip limiting ROM. She has positive hip scour and FABER on the left as well. She responded well to gentle  long leg traction. Her left hip ADDuctors are very tight and tender as well. Patient amb with an antalgic gait and is unable to take a normal step or stride length. She uses a cane intermittently at home for safety.    History and Personal Factors relevant to plan of care:  HTN, OP, arthritis, stenosis lumbar spine    Clinical Presentation  Evolving    Clinical Presentation due to:  worsening sx    Clinical Decision Making  Low    Rehab Potential  Fair    PT Frequency  2x / week    PT Duration  8 weeks    PT Treatment/Interventions  ADLs/Self Care Home Management;Electrical Stimulation;Moist  Heat;Ultrasound;Cryotherapy;Therapeutic exercise;Neuromuscular re-education;Patient/family education;Manual techniques;Dry needling    PT Next Visit Plan  gentle hip mobs/distraction (caution OP); STW/manual to left hip ADD (possible DN); work on getting hip moving; strengthening    PT Home Exercise Plan  [id number] - HS stretch and traction at sink    Consulted and Agree with Plan of Care  Patient       Patient will benefit from skilled therapeutic intervention in order to improve the following deficits and impairments:  Difficulty walking, Pain, Decreased activity tolerance, Decreased strength, Decreased range of motion, Impaired flexibility  Visit Diagnosis: Pain in left hip - Plan: PT plan of care cert/re-cert  Chronic bilateral low back pain, unspecified whether sciatica present - Plan: PT plan of care cert/re-cert  Stiffness of left hip, not elsewhere classified - Plan: PT plan of care cert/re-cert     Problem List Patient Active Problem List   Diagnosis Date Noted  . B12 deficiency 02/01/2018  . DDD (degenerative disc disease), lumbar 01/03/2018  . Hyponatremia 10/04/2017  . Thrombocytosis ([place]) 10/04/2017  . Chronic respiratory failure with hypoxia ([place]) 09/01/2017  . Dyslipidemia, goal LDL below 70 12/06/2016  . Aortic atherosclerosis ([place]) 12/06/2016  . Diverticulosis 04/11/2016  . Essential hypertension 10/03/2013  . Current smoker 10/03/2013  . Esophageal reflux 10/03/2013  . Anxiety disorder 10/03/2013  . Allergic rhinitis 10/03/2013  . Insomnia 07/17/2013    Madelyn Flavors PT 04/30/2018, 4:05 PM  [place], [place], [address] Phone: [phone]   Fax:  [phone]  Name: Beverly [name] MRN: [id number] Date of Birth: 03-02-1951

## 2018-05-02 ENCOUNTER — Ambulatory Visit: Payer: PPO | Admitting: Physical Therapy

## 2018-05-02 ENCOUNTER — Encounter: Payer: Self-pay | Admitting: Physical Therapy

## 2018-05-02 DIAGNOSIS — G8929 Other chronic pain: Secondary | ICD-10-CM

## 2018-05-02 DIAGNOSIS — M25652 Stiffness of left hip, not elsewhere classified: Secondary | ICD-10-CM

## 2018-05-02 DIAGNOSIS — M25552 Pain in left hip: Secondary | ICD-10-CM | POA: Diagnosis not present

## 2018-05-02 DIAGNOSIS — M545 Low back pain, unspecified: Secondary | ICD-10-CM

## 2018-05-02 NOTE — Therapy (Signed)
[place], [place], [address] Phone: [phone]   Fax:  [phone]  Physical Therapy Treatment  Patient Details  Name: Beverly [name] MRN: [id number] Date of Birth: 10-02-1950 Referring Provider (PT): Karlton Lemon   Encounter Date: 05/02/2018  PT End of Session - 05/02/18 1159    Visit Number  2    Date for PT Re-Evaluation  06/25/18    PT Start Time  1157    PT Stop Time  1250    PT Time Calculation (min)  53 min       History reviewed. No pertinent past medical history.  History reviewed. No pertinent surgical history.  There were no vitals filed for this visit.  Subjective Assessment - 05/02/18 1247    Subjective  "I feel about the same" as last visit.  Pt is nervous about injection she is scheduled to have on Monday.     Diagnostic tests  L3/4 stenosis with left L4 nerve impingement; stenosis L1-L5; left hip bone on bone (per pt)    Patient Stated Goals  to decrease pain    Currently in Pain?  Yes    Pain Score  7     Pain Location  Hip    Pain Orientation  Left    Aggravating Factors   walking    Pain Relieving Factors  sitting/ heat          [facility] PT Assessment - 05/02/18 0001      Assessment   Medical Diagnosis  left lumbar radiculopathy    Referring Provider (PT)  Karlton Lemon    Onset Date/Surgical Date  12/11/16    Next MD Visit  none scheduled        [facility] Adult PT Treatment/Exercise - 05/02/18 0001      Self-Care   Self-Care  Other Self-Care Comments    Other Self-Care Comments   pt educated re: self massage with roller stick to LE to decrease fascial tightness; pt verbalized understanding.       Exercises   Exercises  Knee/Hip      Knee/Hip Exercises: Stretches   Passive Hamstring Stretch  Left;30 seconds;4 reps   seated x 2, supine wiht strap x 2   Hip Flexor Stretch  Left;4 reps;30 seconds   1x seated, 3x supine   Hip Flexor Stretch Limitations   leg off table, opp knee bent, Lt foot on prop    Piriformis Stretch  Right;1 rep;Left;2 reps;20 seconds    Piriformis Stretch Limitations  supine piriformis stretch increased ant hip pain on Lt, relief felt with pressing down on knee in supine fig 4 position    Other Knee/Hip Stretches  L stretch at sink x 15 sec x 5 reps (per HEP)    Other Knee/Hip Stretches  Lt adductor stretch, supine with strap, 20 sec x 2; supine butterfly stretch - 10 sec, stopped due to increased posterior hip pain.       Knee/Hip Exercises: Aerobic   Nustep  L2: 6 min       Knee/Hip Exercises: Supine   Bridges  1 set;10 reps      Modalities   Modalities  Electrical Stimulation;Moist Heat      Moist Heat Therapy   Number Minutes Moist Heat  15 Minutes    Moist Heat Location  Hip   Lt     Electrical Stimulation   Electrical Stimulation Location  Lt ant hip  Electrical Stimulation Action  IFC, 80-150 Hz x 15 min     Electrical Stimulation Parameters  intensity to pt tolerance    Electrical Stimulation Goals  Pain;Tone               PT Short Term Goals - 04/30/18 1556      PT SHORT TERM GOAL #1   Title  Ind with inital HEP    Time  2    Period  Weeks    Status  New    Target Date  05/14/18        PT Long Term Goals - 04/30/18 1557      PT LONG TERM GOAL #1   Title  Patient to report decreased pain in her left hip by 50% or more with ADLs.    Time  8    Period  Weeks    Status  New      PT LONG TERM GOAL #2   Title  Patient able to ambulate with a normal gait pattern with pain <= 2/10.    Time  8    Period  Weeks    Status  New      PT LONG TERM GOAL #3   Title  Patient to demo 4+ to 5/5 BLE strength to ease ADLs.     Time  8    Period  Weeks    Status  New      PT LONG TERM GOAL #4   Title  Patient able to ambulate safely to and from her mailbox without AD.    Time  8    Period  Weeks    Status  New            Plan - 05/02/18 1340    Clinical Impression  Statement  Pt has increased tightness throughout Lt hip.  She had difficulty tolerating hip flexor stretch past neutral in supine position, and seated hip flexor stretch increased LBP.  Pt tolerated all other exercises well. Pt reported decrease in pain by 2 points with walking after MHP/estim at end of session. Goals are ongoing at this time.     Rehab Potential  Fair    PT Frequency  2x / week    PT Duration  8 weeks    PT Treatment/Interventions  ADLs/Self Care Home Management;Electrical Stimulation;Moist Heat;Ultrasound;Cryotherapy;Therapeutic exercise;Neuromuscular re-education;Patient/family education;Manual techniques;Dry needling    PT Next Visit Plan  STW/manual to left hip ADD (possible DN); gentle hip mobs/distraction (caution OP); work on getting hip moving; gentle strengthening    PT Home Exercise Plan  [email or web address] -updated    Consulted and Agree with Plan of Care  Patient       Patient will benefit from skilled therapeutic intervention in order to improve the following deficits and impairments:  Difficulty walking, Pain, Decreased activity tolerance, Decreased strength, Decreased range of motion, Impaired flexibility  Visit Diagnosis: Pain in left hip  Chronic bilateral low back pain, unspecified whether sciatica present  Stiffness of left hip, not elsewhere classified     Problem List Patient Active Problem List   Diagnosis Date Noted  . B12 deficiency 02/01/2018  . DDD (degenerative disc disease), lumbar 01/03/2018  . Hyponatremia 10/04/2017  . Thrombocytosis ([place]) 10/04/2017  . Chronic respiratory failure with hypoxia ([place]) 09/01/2017  . Dyslipidemia, goal LDL below 70 12/06/2016  . Aortic atherosclerosis ([place]) 12/06/2016  . Diverticulosis 04/11/2016  . Essential hypertension 10/03/2013  . Current smoker 10/03/2013  . Esophageal reflux 10/03/2013  .  Anxiety disorder 10/03/2013  . Allergic rhinitis 10/03/2013  . Insomnia 07/17/2013   Kerin Perna,  PTA 05/02/18 1:59 PM  [place], [place], [address] Phone: [phone]   Fax:  [phone]  Name: Beverly [name] MRN: [id number] Date of Birth: [birth year]

## 2018-05-02 NOTE — Patient Instructions (Signed)

## 2018-05-06 ENCOUNTER — Ambulatory Visit: Payer: PPO | Admitting: Physical Therapy

## 2018-05-06 ENCOUNTER — Other Ambulatory Visit: Payer: PPO

## 2018-05-06 DIAGNOSIS — G8929 Other chronic pain: Secondary | ICD-10-CM

## 2018-05-06 DIAGNOSIS — M25552 Pain in left hip: Secondary | ICD-10-CM | POA: Diagnosis not present

## 2018-05-06 DIAGNOSIS — M25652 Stiffness of left hip, not elsewhere classified: Secondary | ICD-10-CM

## 2018-05-06 DIAGNOSIS — M545 Low back pain: Secondary | ICD-10-CM

## 2018-05-06 NOTE — Therapy (Signed)
[place], [place], [address] Phone: [phone]   Fax:  [phone]  Physical Therapy Treatment  Patient Details  Name: Beverly [name] MRN: [id number] Date of Birth: 08/10/1950 Referring Provider (PT): Karlton Lemon   Encounter Date: 05/06/2018  PT End of Session - 05/06/18 1049    Visit Number  3    Date for PT Re-Evaluation  06/25/18    PT Start Time  1020    PT Stop Time  1115    PT Time Calculation (min)  55 min       No past medical history on file.  No past surgical history on file.  There were no vitals filed for this visit.  Subjective Assessment - 05/06/18 1020    Subjective  better. doing stretches at home and its helping. cancelled epidural- want to try PT first since 1st  inj did not help. ordered TENS    Currently in Pain?  Yes    Pain Score  5     Pain Location  Hip    Pain Orientation  Left                       [facility] Adult PT Treatment/Exercise - 05/06/18 0001      Exercises   Exercises  Lumbar      Lumbar Exercises: Standing   Row  Strengthening;Both;15 reps;Theraband    Theraband Level (Row)  Level 2 (Red)    Shoulder Extension  Strengthening;Both;15 reps;Theraband    Theraband Level (Shoulder Extension)  Level 2 (Red)      Lumbar Exercises: Seated   Other Seated Lumbar Exercises  pelvis ROM on sit fit 15x 4 ways    Other Seated Lumbar Exercises  on sit fit LAQ,marching and hip abd 10 each leg   some increased left hip pain     Lumbar Exercises: Supine   Ab Set  15 reps   with ball   Other Supine Lumbar Exercises  bridge, KTC and obl feet on ball 15 each      Knee/Hip Exercises: Aerobic   Nustep  L2: 6 min       Knee/Hip Exercises: Standing   Other Standing Knee Exercises  yellow tband 12 reps hip ext and abd BIL      Modalities   Modalities  Electrical Stimulation;Moist Heat      Moist Heat Therapy   Number Minutes Moist Heat  15  Minutes    Moist Heat Location  Lumbar Spine;Hip   left     Electrical Stimulation   Electrical Stimulation Location  Lt ant hip     Electrical Stimulation Action  IFC    Electrical Stimulation Parameters  as tolerated supine    Electrical Stimulation Goals  Pain               PT Short Term Goals - 04/30/18 1556      PT SHORT TERM GOAL #1   Title  Ind with inital HEP    Time  2    Period  Weeks    Status  New    Target Date  05/14/18        PT Long Term Goals - 04/30/18 1557      PT LONG TERM GOAL #1   Title  Patient to report decreased pain in her left hip by 50% or more with ADLs.    Time  8  Period  Weeks    Status  New      PT LONG TERM GOAL #2   Title  Patient able to ambulate with a normal gait pattern with pain <= 2/10.    Time  8    Period  Weeks    Status  New      PT LONG TERM GOAL #3   Title  Patient to demo 4+ to 5/5 BLE strength to ease ADLs.     Time  8    Period  Weeks    Status  New      PT LONG TERM GOAL #4   Title  Patient able to ambulate safely to and from her mailbox without AD.    Time  8    Period  Weeks    Status  New            Plan - 05/06/18 1049    Clinical Impression Statement  increased ther ex today and tolerated well with postural cuing and core activation. some c/o increased left hip pain. instructed to continue stretching at home and bring TENS in when she recieves and we can educ on use    PT Treatment/Interventions  ADLs/Self Care Home Management;Electrical Stimulation;Moist Heat;Ultrasound;Cryotherapy;Therapeutic exercise;Neuromuscular re-education;Patient/family education;Manual techniques;Dry needling    PT Next Visit Plan  STW/manual to left hip ADD (possible DN); gentle hip mobs/distraction (caution OP); work on getting hip moving; gentle strengthening       Patient will benefit from skilled therapeutic intervention in order to improve the following deficits and impairments:  Difficulty walking, Pain,  Decreased activity tolerance, Decreased strength, Decreased range of motion, Impaired flexibility  Visit Diagnosis: Pain in left hip  Chronic bilateral low back pain, unspecified whether sciatica present  Stiffness of left hip, not elsewhere classified     Problem List Patient Active Problem List   Diagnosis Date Noted  . B12 deficiency 02/01/2018  . DDD (degenerative disc disease), lumbar 01/03/2018  . Hyponatremia 10/04/2017  . Thrombocytosis ([place]) 10/04/2017  . Chronic respiratory failure with hypoxia ([place]) 09/01/2017  . Dyslipidemia, goal LDL below 70 12/06/2016  . Aortic atherosclerosis ([place]) 12/06/2016  . Diverticulosis 04/11/2016  . Essential hypertension 10/03/2013  . Current smoker 10/03/2013  . Esophageal reflux 10/03/2013  . Anxiety disorder 10/03/2013  . Allergic rhinitis 10/03/2013  . Insomnia 07/17/2013    Abcde Oneil,ANGIE PTA 05/06/2018, 10:50 AM  [place], [place], [address] Phone: [phone]   Fax:  [phone]  Name: Beverly [name] MRN: [id number] Date of Birth: 1950-03-15

## 2018-05-13 ENCOUNTER — Ambulatory Visit: Payer: PPO | Attending: Family Medicine | Admitting: Physical Therapy

## 2018-05-13 DIAGNOSIS — M25552 Pain in left hip: Secondary | ICD-10-CM

## 2018-05-13 DIAGNOSIS — M25652 Stiffness of left hip, not elsewhere classified: Secondary | ICD-10-CM | POA: Insufficient documentation

## 2018-05-13 DIAGNOSIS — G8929 Other chronic pain: Secondary | ICD-10-CM | POA: Insufficient documentation

## 2018-05-13 DIAGNOSIS — M545 Low back pain: Secondary | ICD-10-CM | POA: Diagnosis not present

## 2018-05-13 NOTE — Therapy (Signed)
[place], [place], [address] Phone: [phone]   Fax:  [phone]  Physical Therapy Treatment  Patient Details  Name: Beverly [name] MRN: [id number] Date of Birth: March 31, 1950 Referring Provider (PT): Karlton Lemon   Encounter Date: 05/13/2018  PT End of Session - 05/13/18 1444    Visit Number  4    Date for PT Re-Evaluation  06/25/18    PT Start Time  5625    PT Stop Time  6389    PT Time Calculation (min)  40 min       No past medical history on file.  No past surgical history on file.  There were no vitals filed for this visit.  Subjective Assessment - 05/13/18 1407    Subjective  much better, amb in without AD. Also seeing kinesiologist in [place] 1 x month- pt states getting stimulation of some kind and exercise. Pt verb using TENS at home    Currently in Pain?  Yes    Pain Score  2     Pain Location  Back                       [facility] Adult PT Treatment/Exercise - 05/13/18 0001      Lumbar Exercises: Aerobic   UBE (Upper Arm Bike)  L 3 2 fwd/2 back      Lumbar Exercises: Machines for Strengthening   Cybex Lumbar Extension  black tband 2 sets 10, trunk flex and ext    Cybex Knee Extension  2 sets 10 5#    Cybex Knee Flexion  2 sets 10 15#    Leg Press  2 sets 10 20#   isometric abdominal 15 times 3 sec hold   Other Lumbar Machine Exercise  lat pull and row 15# 2 sets 10    Other Lumbar Machine Exercise  black bar heel and toe raises 15 each      Knee/Hip Exercises: Aerobic   Nustep  L 4 6 min      Knee/Hip Exercises: Standing   Other Standing Knee Exercises  yellow tband 12 reps hip ext and abd BIL               PT Short Term Goals - 05/13/18 1445      PT SHORT TERM GOAL #1   Title  Ind with inital HEP    Status  Achieved        PT Long Term Goals - 04/30/18 1557      PT LONG TERM GOAL #1   Title  Patient to report decreased pain  in her left hip by 50% or more with ADLs.    Time  8    Period  Weeks    Status  New      PT LONG TERM GOAL #2   Title  Patient able to ambulate with a normal gait pattern with pain <= 2/10.    Time  8    Period  Weeks    Status  New      PT LONG TERM GOAL #3   Title  Patient to demo 4+ to 5/5 BLE strength to ease ADLs.     Time  8    Period  Weeks    Status  New      PT LONG TERM GOAL #4   Title  Patient able to ambulate safely to and from her mailbox  without AD.    Time  8    Period  Weeks    Status  New            Plan - 05/13/18 1444    Clinical Impression Statement  pt amb without AD with increased cadeance and no pain. pt tolerated increased ex well and felt good with ex but needed cuing for speed and control. weakness in left leg noted.reviewed TENS use    PT Treatment/Interventions  ADLs/Self Care Home Management;Electrical Stimulation;Moist Heat;Ultrasound;Cryotherapy;Therapeutic exercise;Neuromuscular re-education;Patient/family education;Manual techniques;Dry needling    PT Next Visit Plan  issue hip tband and scap stab with tband       Patient will benefit from skilled therapeutic intervention in order to improve the following deficits and impairments:  Difficulty walking, Pain, Decreased activity tolerance, Decreased strength, Decreased range of motion, Impaired flexibility  Visit Diagnosis: Pain in left hip  Chronic bilateral low back pain, unspecified whether sciatica present  Stiffness of left hip, not elsewhere classified     Problem List Patient Active Problem List   Diagnosis Date Noted  . B12 deficiency 02/01/2018  . DDD (degenerative disc disease), lumbar 01/03/2018  . Hyponatremia 10/04/2017  . Thrombocytosis ([place]) 10/04/2017  . Chronic respiratory failure with hypoxia ([place]) 09/01/2017  . Dyslipidemia, goal LDL below 70 12/06/2016  . Aortic atherosclerosis ([place]) 12/06/2016  . Diverticulosis 04/11/2016  . Essential hypertension  10/03/2013  . Current smoker 10/03/2013  . Esophageal reflux 10/03/2013  . Anxiety disorder 10/03/2013  . Allergic rhinitis 10/03/2013  . Insomnia 07/17/2013    Beverly [name],Beverly [name] PTA 05/13/2018, 2:46 PM  [place], [place], [address] Phone: [phone]   Fax:  [phone]  Name: Beverly [name] MRN: [id number] Date of Birth: 1950-08-08

## 2018-05-14 DIAGNOSIS — N390 Urinary tract infection, site not specified: Secondary | ICD-10-CM | POA: Diagnosis not present

## 2018-05-14 DIAGNOSIS — F17210 Nicotine dependence, cigarettes, uncomplicated: Secondary | ICD-10-CM | POA: Diagnosis not present

## 2018-05-14 DIAGNOSIS — J410 Simple chronic bronchitis: Secondary | ICD-10-CM | POA: Diagnosis not present

## 2018-05-15 ENCOUNTER — Ambulatory Visit: Payer: PPO | Admitting: Physical Therapy

## 2018-05-15 ENCOUNTER — Encounter: Payer: PPO | Admitting: Physical Therapy

## 2018-05-15 ENCOUNTER — Encounter: Payer: Self-pay | Admitting: Physical Therapy

## 2018-05-15 DIAGNOSIS — M25552 Pain in left hip: Secondary | ICD-10-CM | POA: Diagnosis not present

## 2018-05-15 DIAGNOSIS — M545 Low back pain: Secondary | ICD-10-CM

## 2018-05-15 DIAGNOSIS — M25652 Stiffness of left hip, not elsewhere classified: Secondary | ICD-10-CM

## 2018-05-15 DIAGNOSIS — G8929 Other chronic pain: Secondary | ICD-10-CM

## 2018-05-15 NOTE — Therapy (Signed)
[place], [place], [address] Phone: [phone]   Fax:  [phone]  Physical Therapy Treatment  Patient Details  Name: Beverly [name] MRN: [id number] Date of Birth: April 07, 1950 Referring Provider (PT): Karlton Lemon   Encounter Date: 05/15/2018  PT End of Session - 05/15/18 1515    Visit Number  5    PT Start Time  1062    PT Stop Time  1515    PT Time Calculation (min)  40 min    Activity Tolerance  Patient tolerated treatment well    Behavior During Therapy  [facility] for tasks assessed/performed       History reviewed. No pertinent past medical history.  History reviewed. No pertinent surgical history.  There were no vitals filed for this visit.  Subjective Assessment - 05/15/18 1438    Subjective  Pt reports increase hip and low back pain. "It just comes and goes"    Currently in Pain?  Yes    Pain Score  9     Pain Location  --   L hip and low back                      [facility] Adult PT Treatment/Exercise - 05/15/18 0001      Lumbar Exercises: Aerobic   UBE (Upper Arm Bike)  L 3 2 fwd/2 back    Nustep  L3 x6 min      Lumbar Exercises: Machines for Strengthening   Cybex Knee Extension  2 sets 10 5#    Cybex Knee Flexion  20lb 2x10    Other Lumbar Machine Exercise  lat pull and row 20lb  2 sets 10      Lumbar Exercises: Standing   Row  Strengthening;Both;20 reps;Theraband    Theraband Level (Row)  Level 3 (Green)    Shoulder Extension  Strengthening;Both;Theraband;20 reps    Theraband Level (Shoulder Extension)  Level 3 (Green)      Knee/Hip Exercises: Standing   Other Standing Knee Exercises  red tband 12 reps hip ext and abd BIL      Knee/Hip Exercises: Seated   Ball Squeeze  2x10               PT Short Term Goals - 05/13/18 1445      PT SHORT TERM GOAL #1   Title  Ind with inital HEP    Status  Achieved        PT Long Term Goals - 05/15/18  1519      PT LONG TERM GOAL #1   Title  Patient to report decreased pain in her left hip by 50% or more with ADLs.    Status  On-going      PT LONG TERM GOAL #2   Title  Patient able to ambulate with a normal gait pattern with pain <= 2/10.    Status  On-going      PT LONG TERM GOAL #3   Title  Patient to demo 4+ to 5/5 BLE strength to ease ADLs.     Status  On-going            Plan - 05/15/18 1519    Clinical Impression Statement  Pt enter clinic reporting 9/10 hip and LBP. Despite pain rating pt was still willing to do exercises. Increase resistance with seated rows, lats and standing hip exercises without issue. Cues needed not to let LE's to push  against table when standing. She does tend to lean to her R with sit to stands. Postural cues needed with standing rows. Pt reports decrease pain post treatment and actually walk out of clinic without AD.     Rehab Potential  Fair    PT Frequency  2x / week    PT Duration  8 weeks    PT Treatment/Interventions  ADLs/Self Care Home Management;Electrical Stimulation;Moist Heat;Ultrasound;Cryotherapy;Therapeutic exercise;Neuromuscular re-education;Patient/family education;Manual techniques;Dry needling    PT Next Visit Plan  assess treatment, pt entered clinic with pain, issue hip and scap strengthening based on pt tolerance.        Patient will benefit from skilled therapeutic intervention in order to improve the following deficits and impairments:  Difficulty walking, Pain, Decreased activity tolerance, Decreased strength, Decreased range of motion, Impaired flexibility  Visit Diagnosis: Pain in left hip  Chronic bilateral low back pain, unspecified whether sciatica present  Stiffness of left hip, not elsewhere classified     Problem List Patient Active Problem List   Diagnosis Date Noted  . B12 deficiency 02/01/2018  . DDD (degenerative disc disease), lumbar 01/03/2018  . Hyponatremia 10/04/2017  . Thrombocytosis ([place])  10/04/2017  . Chronic respiratory failure with hypoxia ([place]) 09/01/2017  . Dyslipidemia, goal LDL below 70 12/06/2016  . Aortic atherosclerosis ([place]) 12/06/2016  . Diverticulosis 04/11/2016  . Essential hypertension 10/03/2013  . Current smoker 10/03/2013  . Esophageal reflux 10/03/2013  . Anxiety disorder 10/03/2013  . Allergic rhinitis 10/03/2013  . Insomnia 07/17/2013    Scot Jun, PTA 05/15/2018, 3:24 PM  [place], [place], [address] Phone: [phone]   Fax:  [phone]  Name: ALONZO LOVING MRN: [id number] Date of Birth: [birth year]

## 2018-05-21 ENCOUNTER — Ambulatory Visit: Payer: PPO | Admitting: Physical Therapy

## 2018-05-21 DIAGNOSIS — M25552 Pain in left hip: Secondary | ICD-10-CM

## 2018-05-21 DIAGNOSIS — M25652 Stiffness of left hip, not elsewhere classified: Secondary | ICD-10-CM

## 2018-05-21 DIAGNOSIS — G8929 Other chronic pain: Secondary | ICD-10-CM

## 2018-05-21 DIAGNOSIS — M545 Low back pain, unspecified: Secondary | ICD-10-CM

## 2018-05-21 NOTE — Patient Instructions (Signed)
Trigger Point Dry Needling  . What is Trigger Point Dry Needling (DN)? o DN is a physical therapy technique used to treat muscle pain and dysfunction. Specifically, DN helps deactivate muscle trigger points (muscle knots).  o A thin filiform needle is used to penetrate the skin and stimulate the underlying trigger point. The goal is for a local twitch response (LTR) to occur and for the trigger point to relax. No medication of any kind is injected during the procedure.   . What Does Trigger Point Dry Needling Feel Like?  o The procedure feels different for each individual patient. Some patients report that they do not actually feel the needle enter the skin and overall the process is not painful. Very mild bleeding may occur. However, many patients feel a deep cramping in the muscle in which the needle was inserted. This is the local twitch response.   [place] Kitchen How Will I feel after the treatment? o Soreness is normal, and the onset of soreness may not occur for a few hours. Typically this soreness does not last longer than two days.  o Bruising is uncommon, however; ice can be used to decrease any possible bruising.  o In rare cases feeling tired or nauseous after the treatment is normal. In addition, your symptoms may get worse before they get better, this period will typically not last longer than 24 hours.   . What Can I do After My Treatment? o Increase your hydration by drinking more water for the next 24 hours. o You may place ice or heat on the areas treated that have become sore, however, do not use heat on inflamed or bruised areas. Heat often brings more relief post needling. o You can continue your regular activities, but vigorous activity is not recommended initially after the treatment for 24 hours. o DN is best combined with other physical therapy such as strengthening, stretching, and other therapies.     Access Code: [email or web address]  URL: [email or web address]  Date:  05/21/2018  Prepared by: Madelyn Flavors   Exercises  Standing 'L' Stretch at Counter - 2 reps - 1 sets - 20 hold - 2x daily - 7x weekly  Standing Hip Extension - 2-3 reps - 1 sets - 20 sec hold - 1x daily - 7x weekly  Supine Bridge - 10 reps - 2 sets - 1x daily - 7x weekly  Supine Figure 4 Piriformis Stretch - 2 reps - 1 sets - 20 sec hold - 1x daily - 7x weekly  Hooklying Hamstring Stretch with Strap - 10 reps - 3 sets - 1x daily - 7x weekly  Standing Hamstring Curl with Resistance - 10 reps - 3 sets - 1x daily - 7x weekly  Seated Knee Extension with Resistance - 10 reps - 3 sets - 1x daily - 7x weekly  Standing Shoulder Row with Anchored Resistance - 10 reps - 3 sets - 1x daily - 7x weekly  Seated Lat Pull Down with Resistance - Elbows Bent - 10 reps - 3 sets - 1x daily - 7x weekly  Hip Adductors and Hamstring Stretch with Strap - 3 reps - 1 sets - 60 seconds hold - 2x daily - 7x weekly  Supine Hip Adductor Stretch - 3 reps - 1 sets - 60 sec hold - 2x daily - 7x weekly  Patient Education  TENS Therapy

## 2018-05-21 NOTE — Therapy (Signed)
[place], [place], [address] Phone: [phone]   Fax:  [phone]  Physical Therapy Treatment  Patient Details  Name: Beverly [name] MRN: [id number] Date of Birth: 1951/02/09 Referring Provider (PT): Karlton Lemon   Encounter Date: 05/21/2018  PT End of Session - 05/21/18 1330    Visit Number  6    Date for PT Re-Evaluation  06/25/18    PT Start Time  1329    PT Stop Time  1426    PT Time Calculation (min)  57 min    Activity Tolerance  Patient tolerated treatment well    Behavior During Therapy  [facility] for tasks assessed/performed       No past medical history on file.  No past surgical history on file.  There were no vitals filed for this visit.  Subjective Assessment - 05/21/18 1330    Subjective  Patient continues to report high pain 7/10. she says she feels great when she leaves here, but doesn't get the same effect from exercises at home.     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  Yes    Pain Score  7     Pain Location  Back    Pain Orientation  Left    Pain Descriptors / Indicators  Sharp    Pain Type  Chronic pain    Pain Onset  More than a month ago                       [facility] Adult PT Treatment/Exercise - 05/21/18 0001      Lumbar Exercises: Aerobic   Nustep  L3 x6 min      Lumbar Exercises: Machines for Strengthening   Cybex Knee Extension  2 sets 10 5#    Cybex Knee Flexion  20lb 2x10    Other Lumbar Machine Exercise  lat pull and row 30lb  2 sets 10      Lumbar Exercises: Standing   Row  Strengthening;15 reps;Theraband    Theraband Level (Row)  Other (comment)   black   Other Standing Lumbar Exercises  lat pull with black tband x 15 reps      Lumbar Exercises: Seated   Long Arc Quad on Chair  Strengthening;Left;1 set;5 reps   with band for HEP   Other Seated Lumbar Exercises  knee flex with band for HEP x 5 red      Knee/Hip Exercises: Stretches   Other Knee/Hip Stretches  Lt adductor stretch, supine with strap, 60 sec x 1; supine butterfly stretch - 30 sec      Manual Therapy   Manual Therapy  Soft tissue mobilization    Soft tissue mobilization  to left hip ADDuctors       Trigger Point Dry Needling - 05/21/18 0001    Consent Given?  Yes    Education Handout Provided  Yes    Muscles Treated Lower Quadrant  Adductor longus/brevis/magnus    Adductor Response  Twitch response elicited;Palpable increased muscle length           PT Education - 05/21/18 1431    Education Details  DN education and aftercare; HEP    Person(s) Educated  Patient    Methods  Explanation;Demonstration;Handout    Comprehension  Verbalized understanding;Returned demonstration       PT Short Term Goals - 05/13/18 1445      PT SHORT TERM GOAL #1   Title  Ind with inital HEP    Status  Achieved        PT Long Term Goals - 05/15/18 1519      PT LONG TERM GOAL #1   Title  Patient to report decreased pain in her left hip by 50% or more with ADLs.    Status  On-going      PT LONG TERM GOAL #2   Title  Patient able to ambulate with a normal gait pattern with pain <= 2/10.    Status  On-going      PT LONG TERM GOAL #3   Title  Patient to demo 4+ to 5/5 BLE strength to ease ADLs.     Status  On-going            Plan - 05/21/18 1332    Clinical Impression Statement  Patient presents today with no AD, but poor gait pattern. Antalgic gait and decreased step/stride length bil. She feels very good when she leaves PT so we worked on [employer] to simulate machines. Additionally she gave consent to DN to left hip ADDuctors which resulted in increased muscle length and decreased pain with amb.     PT Treatment/Interventions  ADLs/Self Care  Home Management;Electrical Stimulation;Moist Heat;Ultrasound;Cryotherapy;Therapeutic exercise;Neuromuscular re-education;Patient/family education;Manual techniques;Dry needling    PT Next Visit Plan  assess DN; continue work on left hip ADDuctors; review HEP if pt brings in her own bands;      PT [place] -updated 05/21/18    Consulted and Agree with Plan of Care  Patient       Patient will benefit from skilled therapeutic intervention in order to improve the following deficits and impairments:  Difficulty walking, Pain, Decreased activity tolerance, Decreased strength, Decreased range of motion, Impaired flexibility  Visit Diagnosis: Pain in left hip  Chronic bilateral low back pain, unspecified whether sciatica present  Stiffness of left hip, not elsewhere classified     Problem List Patient Active Problem List   Diagnosis Date Noted  . B12 deficiency 02/01/2018  . DDD (degenerative disc disease), lumbar 01/03/2018  . Hyponatremia 10/04/2017  . Thrombocytosis ([place]) 10/04/2017  . Chronic respiratory failure with hypoxia ([place]) 09/01/2017  . Dyslipidemia, goal LDL below 70 12/06/2016  . Aortic atherosclerosis ([place]) 12/06/2016  . Diverticulosis 04/11/2016  . Essential hypertension 10/03/2013  . Current smoker 10/03/2013  . Esophageal reflux 10/03/2013  . Anxiety disorder 10/03/2013  . Allergic rhinitis 10/03/2013  . Insomnia 07/17/2013    Madelyn Flavors PT 05/21/2018, 2:39 PM  [place], [place], [address] Phone: [phone]   Fax:  [phone]  Name: Beverly [name] MRN: [id number] Date of Birth: 10-15-1950

## 2018-05-23 ENCOUNTER — Ambulatory Visit: Payer: PPO | Admitting: Physical Therapy

## 2018-05-23 ENCOUNTER — Encounter: Payer: Self-pay | Admitting: Physical Therapy

## 2018-05-23 ENCOUNTER — Other Ambulatory Visit: Payer: Self-pay

## 2018-05-23 DIAGNOSIS — M25652 Stiffness of left hip, not elsewhere classified: Secondary | ICD-10-CM

## 2018-05-23 DIAGNOSIS — M25552 Pain in left hip: Secondary | ICD-10-CM

## 2018-05-23 DIAGNOSIS — M545 Low back pain, unspecified: Secondary | ICD-10-CM

## 2018-05-23 DIAGNOSIS — G8929 Other chronic pain: Secondary | ICD-10-CM

## 2018-05-23 NOTE — Therapy (Signed)
[place] [place], [place], [address] Phone: [phone]   Fax:  [phone]  Physical Therapy Treatment  Patient Details  Name: Beverly [name] MRN: [id number] Date of Birth: 18-Mar-1950 Referring Provider (PT): Karlton Lemon   Encounter Date: 05/23/2018  PT End of Session - 05/23/18 1233    Visit Number  7    Date for PT Re-Evaluation  06/25/18    PT Start Time  1149    PT Stop Time  1229    PT Time Calculation (min)  40 min    Activity Tolerance  Patient tolerated treatment well    Behavior During Therapy  [facility] for tasks assessed/performed       History reviewed. No pertinent past medical history.  History reviewed. No pertinent surgical history.  There were no vitals filed for this visit.  Subjective Assessment - 05/23/18 1149    Subjective  Pt reports soreness after DN last session. Today she ambulates with SPC    Currently in Pain?  Yes   when walking   Pain Score  7     Pain Location  Hip    Pain Orientation  Right;Anterior                       [facility] Adult PT Treatment/Exercise - 05/23/18 0001      Lumbar Exercises: Aerobic   Recumbent Bike  Seat 3 L0 x3 min     Nustep  L4 x6 min      Lumbar Exercises: Machines for Strengthening   Cybex Knee Extension  2 sets 15 5#    Cybex Knee Flexion  20lb 2x15    Other Lumbar Machine Exercise  lat pull and row 25lb  2 sets 10      Lumbar Exercises: Seated   Sit to Stand  10 reps   x2     Knee/Hip Exercises: Stretches   Passive Hamstring Stretch  Left;5 reps;10 seconds    Piriformis Stretch  Left;5 reps;10 seconds    Other Knee/Hip Stretches  Single K2C L 5 x 10 sec    Other Knee/Hip Stretches  Lt adductor stretch 5x10 sex      Knee/Hip Exercises: Seated   Ball Squeeze  2x10    Abduction/Adduction   Both;2 sets;10 reps    Abd/Adduction Limitations  green Tband       Manual Therapy   Manual Therapy  Soft tissue  mobilization    Soft tissue mobilization  to left hip ADDuctors               PT Short Term Goals - 05/13/18 1445      PT SHORT TERM GOAL #1   Title  Ind with inital HEP    Status  Achieved        PT Long Term Goals - 05/15/18 1519      PT LONG TERM GOAL #1   Title  Patient to report decreased pain in her left hip by 50% or more with ADLs.    Status  On-going      PT LONG TERM GOAL #2   Title  Patient able to ambulate with a normal gait pattern with pain <= 2/10.    Status  On-going      PT LONG TERM GOAL #3   Title  Patient to demo 4+ to 5/5 BLE strength to ease ADLs.     Status  On-going  Plan - 05/23/18 1233    Clinical Impression Statement  Pt reports soreness after last session. She did well with all strengthening exercises, but does require the occasional cue to complete full ROM. She reports anterior hip pain with ambulation. Some hip tightness notes with passive stretching. Density muscle tissue notes in L hip adductor, tolerated some STM but doe reports pain.     Rehab Potential  Fair    PT Frequency  2x / week    PT Duration  8 weeks    PT Treatment/Interventions  ADLs/Self Care Home Management;Electrical Stimulation;Moist Heat;Ultrasound;Cryotherapy;Therapeutic exercise;Neuromuscular re-education;Patient/family education;Manual techniques;Dry needling    PT Next Visit Plan  continue work on left hip ADDuctors       Patient will benefit from skilled therapeutic intervention in order to improve the following deficits and impairments:  Difficulty walking, Pain, Decreased activity tolerance, Decreased strength, Decreased range of motion, Impaired flexibility  Visit Diagnosis: Pain in left hip  Chronic bilateral low back pain, unspecified whether sciatica present  Stiffness of left hip, not elsewhere classified     Problem List Patient Active Problem List   Diagnosis Date Noted  . B12 deficiency 02/01/2018  . DDD (degenerative disc  disease), lumbar 01/03/2018  . Hyponatremia 10/04/2017  . Thrombocytosis ([place]) 10/04/2017  . Chronic respiratory failure with hypoxia ([place]) 09/01/2017  . Dyslipidemia, goal LDL below 70 12/06/2016  . Aortic atherosclerosis ([place]) 12/06/2016  . Diverticulosis 04/11/2016  . Essential hypertension 10/03/2013  . Current smoker 10/03/2013  . Esophageal reflux 10/03/2013  . Anxiety disorder 10/03/2013  . Allergic rhinitis 10/03/2013  . Insomnia 07/17/2013    Scot Jun, PTA 05/23/2018, 12:51 PM  [place], [place], [address] Phone: [phone]   Fax:  [phone]  Name: Beverly [name] MRN: [id number] Date of Birth: [birth year]

## 2018-05-28 ENCOUNTER — Ambulatory Visit: Payer: PPO | Admitting: Family Medicine

## 2018-05-28 ENCOUNTER — Encounter: Payer: Self-pay | Admitting: Family Medicine

## 2018-05-28 ENCOUNTER — Other Ambulatory Visit: Payer: Self-pay

## 2018-05-28 VITALS — BP 156/86 | Temp 98.3°F | Ht 62.0 in | Wt 143.0 lb

## 2018-05-28 DIAGNOSIS — M5416 Radiculopathy, lumbar region: Secondary | ICD-10-CM

## 2018-05-28 MED ORDER — GABAPENTIN 100 MG PO CAPS: 100.0000 mg | ORAL_CAPSULE | Freq: Three times a day (TID) | ORAL | 1 refills | Status: DC

## 2018-05-28 NOTE — Patient Instructions (Addendum)
We will refer you to neurosurgery to discuss what surgical options you have and what they would entail. Another consideration would be to try something like gabapentin, lyrica, cymbalta coupled with physical therapy. Follow up depends on your neurosurgery visit and next steps. Gabapentin 100mg  - start by taking only at bedtime - if tolerated after a few days increase to twice a day for a few days then finally to three times a day.

## 2018-05-28 NOTE — Progress Notes (Signed)
PCP: Thomes Dinning, MD  Subjective:   HPI: Patient is a 68 y.o. female here for low back/left hip pain.  1/30: Patient reports her pain dates back to October 2018. She was dancing and also went bowling, developed pain in left side of low back radiating around to left upper leg, thigh, hip. States she had a hip injection that did not provide benefit. No pain past the knee. No bowel/bladder dysfunction. No pain into right leg. No numbness or tingling. Pain currently 0/10 but sharp and more severe with walking. Better when walking while back is flexed.  3/17: Patient returns reporting pain in low back into left hip and thigh have not improved since last visit. Injection did not provide any relief. Therapy helps only transiently for up to a day then pain has recurred. No bowel/bladder dysfunction. No numbness or tingling. Better with flexed back and pushing shopping cart.  History reviewed. No pertinent past medical history.  Current Outpatient Medications on File Prior to Visit  Medication Sig Dispense Refill  . ADVAIR HFA 45-21 MCG/ACT inhaler     . albuterol (PROAIR HFA) 108 (90 Base) MCG/ACT inhaler ProAir HFA 90 mcg/actuation aerosol inhaler    . alendronate (FOSAMAX) 70 MG tablet     . atorvastatin (LIPITOR) 20 MG tablet     . Cholecalciferol (VITAMIN D3) 50 MCG (2000 UT) TABS Take by mouth.    . ezetimibe (ZETIA) 10 MG tablet     . fenofibrate 160 MG tablet     . losartan (COZAAR) 100 MG tablet     . meloxicam (MOBIC) 7.5 MG tablet meloxicam 7.5 mg tablet    . metoprolol succinate (TOPROL-XL) 100 MG 24 hr tablet     . predniSONE (STERAPRED UNI-PAK 21 TAB) 5 MG (21) TBPK tablet     . venlafaxine XR (EFFEXOR-XR) 37.5 MG 24 hr capsule venlafaxine ER 37.5 mg capsule,extended release 24 hr    . vitamin B-12 (CYANOCOBALAMIN) 1000 MCG tablet Take 1,000 mcg by mouth daily.     No current facility-administered medications on file prior to visit.     History reviewed. No  pertinent surgical history.  No Known Allergies  Social History   Socioeconomic History  . Marital status: Married    Spouse name: Not on file  . Number of children: Not on file  . Years of education: Not on file  . Highest education level: Not on file  Occupational History  . Not on file  Social Needs  . Financial resource strain: Not on file  . Food insecurity:    Worry: Not on file    Inability: Not on file  . Transportation needs:    Medical: Not on file    Non-medical: Not on file  Tobacco Use  . Smoking status: Current Every Day Smoker  . Smokeless tobacco: Never Used  Substance and Sexual Activity  . Alcohol use: Not on file  . Drug use: Not on file  . Sexual activity: Not on file  Lifestyle  . Physical activity:    Days per week: Not on file    Minutes per session: Not on file  . Stress: Not on file  Relationships  . Social connections:    Talks on phone: Not on file    Gets together: Not on file    Attends religious service: Not on file    Active member of club or organization: Not on file    Attends meetings of clubs or organizations: Not on file  Relationship status: Not on file  . Intimate partner violence:    Fear of current or ex partner: Not on file    Emotionally abused: Not on file    Physically abused: Not on file    Forced sexual activity: Not on file  Other Topics Concern  . Not on file  Social History Narrative  . Not on file    History reviewed. No pertinent family history.  BP (!) 156/86   Temp 98.3 F (36.8 C) (Oral)   Ht 5\' 2"  (1.575 m)   Wt 143 lb (64.9 kg)   BMI 26.16 kg/m   Review of Systems: See HPI above.     Objective:  Physical Exam:  Gen: NAD, comfortable in exam room  Back: No gross deformity, scoliosis. No paraspinal TTP .  No midline or bony TTP. FROM, pain on extension. Strength LEs 5/5 all muscle groups.   2+ MSRs in patellar and achilles tendons, equal bilaterally. Negative SLRs. Sensation intact to  light touch bilaterally.  Left hip: No deformity. Mild limitation IR without pain.  Otherwise FROM with 5/5 strength. No tenderness to palpation. NVI distally. Negative logroll, fabers, piriformis.   Assessment & Plan:  1. Low back pain with radiation into left hip - 2/2 spinal stenosis with left L3 radiculopathy.  Unfortunately not improving with ESI and having done physical therapy (only transient benefit).  No evidence hip pathology.  Will go ahead with referral to neurosurgery for their input/recommendations.  She would like to start gabapentin again - will titrate up at low dose.  F/u dependent on neurosurgery recs.

## 2018-06-04 ENCOUNTER — Other Ambulatory Visit: Payer: Self-pay

## 2018-06-04 DIAGNOSIS — M5416 Radiculopathy, lumbar region: Secondary | ICD-10-CM

## 2018-06-05 DIAGNOSIS — M4727 Other spondylosis with radiculopathy, lumbosacral region: Secondary | ICD-10-CM | POA: Diagnosis not present

## 2018-06-05 DIAGNOSIS — M1610 Unilateral primary osteoarthritis, unspecified hip: Secondary | ICD-10-CM | POA: Diagnosis not present

## 2018-06-13 DIAGNOSIS — M25552 Pain in left hip: Secondary | ICD-10-CM | POA: Diagnosis not present

## 2018-07-19 ENCOUNTER — Encounter: Payer: Self-pay | Admitting: Cardiology

## 2018-07-19 ENCOUNTER — Other Ambulatory Visit: Payer: Self-pay

## 2018-07-19 ENCOUNTER — Ambulatory Visit (INDEPENDENT_AMBULATORY_CARE_PROVIDER_SITE_OTHER): Payer: PPO | Admitting: Cardiology

## 2018-07-19 VITALS — BP 147/81 | HR 87 | Ht 62.0 in | Wt 140.3 lb

## 2018-07-19 DIAGNOSIS — I10 Essential (primary) hypertension: Secondary | ICD-10-CM

## 2018-07-19 DIAGNOSIS — Z01810 Encounter for preprocedural cardiovascular examination: Secondary | ICD-10-CM | POA: Diagnosis not present

## 2018-07-19 DIAGNOSIS — F17200 Nicotine dependence, unspecified, uncomplicated: Secondary | ICD-10-CM

## 2018-07-19 DIAGNOSIS — R9431 Abnormal electrocardiogram [ECG] [EKG]: Secondary | ICD-10-CM

## 2018-07-19 DIAGNOSIS — F1010 Alcohol abuse, uncomplicated: Secondary | ICD-10-CM | POA: Diagnosis not present

## 2018-07-19 NOTE — Progress Notes (Signed)
Primary Physician/Referring:  Thomes Dinning, MD  Patient ID: Jens Som, female    DOB: 08/08/1950, 68 y.o.   MRN: [id number]  Chief Complaint  Patient presents with  . Hypertension  . New Patient (Initial Visit)    HPI: MICHELINE MARKES  is a 68 y.o. female  with hypertension, hyperlipidemia, family history of early CAD, OA, referred to [place] for preoperative cardiac risk stratification for upcoming left hip replacement with Dr. French Ana.  Patient denies any symptoms of chest pain, shortness of breath, leg edema, PND or orthopnea, or symptoms of TIA. She has some mild fatigue in her leg with standing for long periods. She has significant pain in her left hip due to arthritis, but is still able to walk to the mailbox daily which is approximately 0.5 mile. She reports that hypertension and hyperlipidemia is well controlled.   She does currently smoke 0.5 pack per day. Also admits to drinking 4 alcoholic drinks per day.   Past Medical History:  Diagnosis Date  . Hyperlipidemia   . Hypertension     History reviewed. No pertinent surgical history.  Social History   Socioeconomic History  . Marital status: Married    Spouse name: Not on file  . Number of children: 1  . Years of education: Not on file  . Highest education level: Not on file  Occupational History  . Not on file  Social Needs  . Financial resource strain: Not on file  . Food insecurity:    Worry: Not on file    Inability: Not on file  . Transportation needs:    Medical: Not on file    Non-medical: Not on file  Tobacco Use  . Smoking status: Current Every Day Smoker    Packs/day: 0.50    Types: Cigarettes  . Smokeless tobacco: Never Used  Substance and Sexual Activity  . Alcohol use: Yes    Comment: occ  . Drug use: Not on file  . Sexual activity: Not on file  Lifestyle  . Physical activity:    Days per week: Not on file    Minutes per session: Not on file  . Stress: Not on file  Relationships   . Social connections:    Talks on phone: Not on file    Gets together: Not on file    Attends religious service: Not on file    Active member of club or organization: Not on file    Attends meetings of clubs or organizations: Not on file    Relationship status: Not on file  . Intimate partner violence:    Fear of current or ex partner: Not on file    Emotionally abused: Not on file    Physically abused: Not on file    Forced sexual activity: Not on file  Other Topics Concern  . Not on file  Social History Narrative  . Not on file    Current Outpatient Medications on File Prior to Visit  Medication Sig Dispense Refill  . acetaminophen (TYLENOL) 500 MG tablet Take 500 mg by mouth every 6 (six) hours as needed.    Marland Kitchen ADVAIR HFA 45-21 MCG/ACT inhaler     . atorvastatin (LIPITOR) 20 MG tablet     . celecoxib (CELEBREX) 100 MG capsule Take by mouth as needed.    . Cholecalciferol (VITAMIN D3) 50 MCG (2000 UT) TABS Take by mouth.    . ezetimibe (ZETIA) 10 MG tablet     . fenofibrate 160 MG  tablet     . losartan (COZAAR) 100 MG tablet     . metoprolol succinate (TOPROL-XL) 100 MG 24 hr tablet     . traMADol (ULTRAM) 50 MG tablet Take by mouth daily.    Marland Kitchen venlafaxine XR (EFFEXOR-XR) 37.5 MG 24 hr capsule venlafaxine ER 37.5 mg capsule,extended release 24 hr    . vitamin B-12 (CYANOCOBALAMIN) 1000 MCG tablet Take 1,000 mcg by mouth daily.    Marland Kitchen albuterol (PROAIR HFA) 108 (90 Base) MCG/ACT inhaler     . alendronate (FOSAMAX) 70 MG tablet     . gabapentin (NEURONTIN) 100 MG capsule Take 1 capsule (100 mg total) by mouth 3 (three) times daily. (Patient not taking: Reported on 07/19/2018) 90 capsule 1  . meloxicam (MOBIC) 7.5 MG tablet meloxicam 7.5 mg tablet    . predniSONE (STERAPRED UNI-PAK 21 TAB) 5 MG (21) TBPK tablet      No current facility-administered medications on file prior to visit.     Review of Systems  Constitution: Negative for decreased appetite, malaise/fatigue, weight  gain and weight loss.  Eyes: Negative for visual disturbance.  Cardiovascular: Positive for claudication. Negative for chest pain, dyspnea on exertion, leg swelling, orthopnea, palpitations and syncope.  Respiratory: Negative for hemoptysis and wheezing.   Endocrine: Negative for cold intolerance and heat intolerance.  Hematologic/Lymphatic: Does not bruise/bleed easily.  Skin: Negative for nail changes.  Musculoskeletal: Positive for back pain and joint pain (left hip). Negative for muscle weakness and myalgias.  Gastrointestinal: Negative for abdominal pain, change in bowel habit, nausea and vomiting.  Neurological: Negative for difficulty with concentration, dizziness, focal weakness and headaches.  Psychiatric/Behavioral: Negative for altered mental status and suicidal ideas.  All other systems reviewed and are negative.     Objective  Blood pressure (!) 147/81, pulse 87, height 5' 2"  (1.575 m), weight 140 lb 4.8 oz (63.6 kg), SpO2 99 %. Body mass index is 25.66 kg/m.    Physical Exam  Constitutional: She is oriented to person, place, and time. Vital signs are normal. She appears well-developed and well-nourished.  HENT:  Head: Normocephalic and atraumatic.  Neck: Normal range of motion.  Cardiovascular: Normal rate, regular rhythm, normal heart sounds and intact distal pulses.  Pulses:      Femoral pulses are 2+ on the right side and 2+ on the left side.      Popliteal pulses are 2+ on the right side and 2+ on the left side.       Dorsalis pedis pulses are 2+ on the right side and 2+ on the left side.       Posterior tibial pulses are 2+ on the right side and 2+ on the left side.  Acrocyanosis. Decreased capillary refill  Pulmonary/Chest: Effort normal and breath sounds normal. No accessory muscle usage. No respiratory distress.  Abdominal: Soft. Bowel sounds are normal.  Musculoskeletal: Normal range of motion.  Neurological: She is alert and oriented to person, place, and  time.  Skin: Skin is warm, dry and intact.  Psychiatric: She has a normal mood and affect. Her behavior is normal.  Vitals reviewed.  Radiology: No results found.  Laboratory examination:   PCP labs 04/08/2018: Sodium 134, potassium 4.6, creatinine 0.79, EGFR 78, CMP normal.  RBC 4.03, MCH 33.2, platelets 417, CBC otherwise normal.  Ferritin 491, iron panel otherwise normal.  10/03/2017: Cholesterol 134, triglycerides 155, HDL 55, LDL 48.  No flowsheet data found. No flowsheet data found. Lipid Panel  No results found for:  CHOL, TRIG, HDL, CHOLHDL, VLDL, LDLCALC, LDLDIRECT HEMOGLOBIN A1C No results found for: HGBA1C, MPG TSH No results for input(s): TSH in the last 8760 hours.  Cardiac Studies:     Assessment   Preoperative cardiovascular examination - Plan: EKG 12-Lead, PCV MYOCARDIAL PERFUSION WITH LEXISCAN, PCV ECHOCARDIOGRAM COMPLETE  Essential hypertension  Abnormal EKG  Tobacco use disorder  Alcohol use disorder, mild, abuse  EKG 07/19/2018: Normal sinus rhythm at 83 bpm, leftward axis, poor R wave progression, cannot exclude anterior infarct old. T wave abnormality in anteroseptal leads, cannot exclude ischemia.  Recommendations:   Patient with hypertension, hyperlipidemia, ongoing tobacco use, family history of CAD, referred to [place] for preoperative cardiac for stratification for upcoming left hip replacement.  Patient is essentially asymptomatic from cardiac standpoint.  Appears to have some mild claudication symptoms, but has bounding pulses on exam.  She does have decreased capillary refill.  I discussed the importance of complete smoking cessation to further reduce her risk.  Physical exam is otherwise unremarkable.  Has mild abnormalities on EKG possibly related to hypertension.  Blood pressure slightly elevated today, but generally well controlled.  She is on appropriate medical therapy and will continue the same.  Will reevaluate at her next office visit.  In  view of her risk factors, will obtain Lexiscan nuclear stress test for further evaluation and risk stratification for upcoming surgery.  She is unable to walk on the treadmill due to left hip pain.  We will also obtain echocardiogram given her history of hypertension, tobacco use, and EKG abnormalities.  I am concerned regarding her alcohol use.  I have discussed long-term risk of continued alcohol use in excess of alcohol intake.  Encouraged her to look into options to help her quit such as AA meetings or counseling.  Encouraged her to try to cut back on daily alcohol use and eventually complete cessation.  Risk of withdrawals was discussed.  I would like to see her back after the test for further recommendations and reevaluation.   *I have discussed this case with Dr. Einar Gip and he personally examined the patient and participated in formulating the plan.*   Miquel Dunn, MSN, APRN, FNP-C [facility]. [place] Office: [phone] Fax: [phone]

## 2018-07-23 ENCOUNTER — Telehealth: Payer: Self-pay | Admitting: Cardiology

## 2018-07-23 NOTE — Telephone Encounter (Signed)
Office had received call that patient wanted to do treadmill stress test instead of nuclear stress test. I had originally told her that we could do this; however, on further discussion with office staff, in view of COVID-19, we are not performing GXT at this time to reduce risk. I have called patient and explained this to her and also discussed lexiscan and potential side effects. She agrees to proceed. Will have office staff notify her of appt.

## 2018-07-25 DIAGNOSIS — L820 Inflamed seborrheic keratosis: Secondary | ICD-10-CM | POA: Diagnosis not present

## 2018-07-25 DIAGNOSIS — B078 Other viral warts: Secondary | ICD-10-CM | POA: Diagnosis not present

## 2018-07-25 DIAGNOSIS — B079 Viral wart, unspecified: Secondary | ICD-10-CM | POA: Diagnosis not present

## 2018-07-25 DIAGNOSIS — S30861A Insect bite (nonvenomous) of abdominal wall, initial encounter: Secondary | ICD-10-CM | POA: Diagnosis not present

## 2018-07-25 DIAGNOSIS — W57XXXA Bitten or stung by nonvenomous insect and other nonvenomous arthropods, initial encounter: Secondary | ICD-10-CM | POA: Diagnosis not present

## 2018-07-25 DIAGNOSIS — Z411 Encounter for cosmetic surgery: Secondary | ICD-10-CM | POA: Diagnosis not present

## 2018-07-29 ENCOUNTER — Other Ambulatory Visit: Payer: PPO

## 2018-07-29 ENCOUNTER — Ambulatory Visit (INDEPENDENT_AMBULATORY_CARE_PROVIDER_SITE_OTHER): Payer: PPO

## 2018-07-29 ENCOUNTER — Other Ambulatory Visit: Payer: Self-pay

## 2018-07-29 DIAGNOSIS — Z01810 Encounter for preprocedural cardiovascular examination: Secondary | ICD-10-CM

## 2018-08-06 ENCOUNTER — Other Ambulatory Visit: Payer: Self-pay

## 2018-08-06 ENCOUNTER — Ambulatory Visit (INDEPENDENT_AMBULATORY_CARE_PROVIDER_SITE_OTHER): Payer: PPO

## 2018-08-06 DIAGNOSIS — Z01810 Encounter for preprocedural cardiovascular examination: Secondary | ICD-10-CM | POA: Diagnosis not present

## 2018-08-07 ENCOUNTER — Other Ambulatory Visit: Payer: PPO

## 2018-08-07 NOTE — Progress Notes (Signed)
LVM FOR PT TO CALL BACK

## 2018-08-07 NOTE — Progress Notes (Unsigned)
Pre-op clearance letter sent to Dr. French Ana. Patient is low risk for surgery.

## 2018-08-07 NOTE — Progress Notes (Signed)
Patient called and wanted to know if she has to come in for her results

## 2018-08-07 NOTE — Progress Notes (Signed)
I can do a virtual or telephone visit to discuss her test results more in detail if she wishes. If not can follow up with [place] as needed for any problems

## 2018-08-14 ENCOUNTER — Other Ambulatory Visit: Payer: Self-pay

## 2018-08-14 ENCOUNTER — Encounter: Payer: Self-pay | Admitting: Cardiology

## 2018-08-14 ENCOUNTER — Ambulatory Visit: Payer: PPO | Admitting: Cardiology

## 2018-08-14 VITALS — BP 131/82 | HR 103 | Ht 62.0 in | Wt 136.0 lb

## 2018-08-14 DIAGNOSIS — Z01810 Encounter for preprocedural cardiovascular examination: Secondary | ICD-10-CM

## 2018-08-14 DIAGNOSIS — F17200 Nicotine dependence, unspecified, uncomplicated: Secondary | ICD-10-CM

## 2018-08-14 DIAGNOSIS — F1010 Alcohol abuse, uncomplicated: Secondary | ICD-10-CM

## 2018-08-14 DIAGNOSIS — I10 Essential (primary) hypertension: Secondary | ICD-10-CM | POA: Diagnosis not present

## 2018-08-14 NOTE — Progress Notes (Signed)
Primary Physician/Referring:  Thomes Dinning, MD  Patient ID: Beverly [name], female    DOB: 1950-11-27, 68 y.o.   MRN: [id number]  Chief Complaint  Patient presents with  . Pre-op Exam  . Hypertension  . Results    nuc, echo  . Follow-up   This visit type was conducted due to national recommendations for restrictions regarding the COVID-19 Pandemic (e.g. social distancing).  This format is felt to be most appropriate for this patient at this time.  All issues noted in this document were discussed and addressed.  No physical exam was performed (except for noted visual exam findings with Telehealth visits).  The patient has consented to conduct a Telehealth visit and understands insurance will be billed.   I discussed the limitations of evaluation and management by telemedicine and the availability of in person appointments. The patient expressed understanding and agreed to proceed.  Virtual Visit via Video Note is as below  I connected with Mrs. Tollett, on 08/15/18 at 1445 by telephone and verified that I am speaking with the correct person using two identifiers. Unable to perform video visit as patient did not have equipment.    I have discussed with the patient regarding the safety during COVID Pandemic and steps and precautions including social distancing with the patient.    HPI: Beverly [name]  is a 68 y.o. female  with hypertension, hyperlipidemia, family history of early CAD, OA, recently evaluated by [place] for preoperative cardiac risk stratification for upcoming left hip replacement with Dr. French Ana.  She underwent lexiscan nuclear stress test and echocardiogram, here to discuss results. Patient denies any symptoms of chest pain, shortness of breath, leg edema, PND or orthopnea, or symptoms of TIA. She has some mild fatigue in her leg with standing for long periods. She has significant pain in her left hip due to arthritis, but is still able to walk to the mailbox daily which  is approximately 0.5 mile. She reports that hypertension and hyperlipidemia is well controlled.   She does currently smoke 0.5 pack per day. Also admits to drinking 4 alcoholic drinks per day. She has not been able to cut back on either of these; however, does state that she has gone for a few days without drinking any alcohol.   Past Medical History:  Diagnosis Date  . Hyperlipidemia   . Hypertension     History reviewed. No pertinent surgical history.  Social History   Socioeconomic History  . Marital status: Married    Spouse name: Not on file  . Number of children: 1  . Years of education: Not on file  . Highest education level: Not on file  Occupational History  . Not on file  Social Needs  . Financial resource strain: Not on file  . Food insecurity:    Worry: Not on file    Inability: Not on file  . Transportation needs:    Medical: Not on file    Non-medical: Not on file  Tobacco Use  . Smoking status: Current Every Day Smoker    Packs/day: 0.50    Types: Cigarettes  . Smokeless tobacco: Never Used  Substance and Sexual Activity  . Alcohol use: Yes    Comment: occ  . Drug use: Not on file  . Sexual activity: Not on file  Lifestyle  . Physical activity:    Days per week: Not on file    Minutes per session: Not on file  . Stress: Not on file  Relationships  . Social connections:    Talks on phone: Not on file    Gets together: Not on file    Attends religious service: Not on file    Active member of club or organization: Not on file    Attends meetings of clubs or organizations: Not on file    Relationship status: Not on file  . Intimate partner violence:    Fear of current or ex partner: Not on file    Emotionally abused: Not on file    Physically abused: Not on file    Forced sexual activity: Not on file  Other Topics Concern  . Not on file  Social History Narrative  . Not on file    Current Outpatient Medications on File Prior to Visit   Medication Sig Dispense Refill  . acetaminophen (TYLENOL) 500 MG tablet Take 500 mg by mouth every 6 (six) hours as needed.    Marland Kitchen ADVAIR HFA 45-21 MCG/ACT inhaler as needed.     Marland Kitchen albuterol (PROAIR HFA) 108 (90 Base) MCG/ACT inhaler as needed.     Marland Kitchen alendronate (FOSAMAX) 70 MG tablet Take 70 mg by mouth once a week.     Marland Kitchen atorvastatin (LIPITOR) 20 MG tablet Take 20 mg by mouth daily.     Marland Kitchen augmented betamethasone dipropionate (DIPROLENE-AF) 0.05 % ointment prn    . celecoxib (CELEBREX) 100 MG capsule Take by mouth as needed.    . Cholecalciferol (VITAMIN D3) 50 MCG (2000 UT) TABS Take by mouth.    . ezetimibe (ZETIA) 10 MG tablet Take 10 mg by mouth daily.     . fenofibrate 160 MG tablet Take 160 mg by mouth daily.     Marland Kitchen losartan (COZAAR) 100 MG tablet Take 100 mg by mouth daily.     . meloxicam (MOBIC) 7.5 MG tablet meloxicam 7.5 mg tablet    . metoprolol succinate (TOPROL-XL) 100 MG 24 hr tablet Take 100 mg by mouth daily.     . traMADol (ULTRAM) 50 MG tablet Take 50 mg by mouth daily.     Marland Kitchen venlafaxine XR (EFFEXOR-XR) 37.5 MG 24 hr capsule venlafaxine ER 37.5 mg capsule,extended release 24 hr    . vitamin B-12 (CYANOCOBALAMIN) 1000 MCG tablet Take 1,000 mcg by mouth daily.     No current facility-administered medications on file prior to visit.     Review of Systems  Constitution: Negative for decreased appetite, malaise/fatigue, weight gain and weight loss.  Eyes: Negative for visual disturbance.  Cardiovascular: Positive for claudication. Negative for chest pain, dyspnea on exertion, leg swelling, orthopnea, palpitations and syncope.  Respiratory: Negative for hemoptysis and wheezing.   Endocrine: Negative for cold intolerance and heat intolerance.  Hematologic/Lymphatic: Does not bruise/bleed easily.  Skin: Negative for nail changes.  Musculoskeletal: Positive for back pain and joint pain (left hip). Negative for muscle weakness and myalgias.  Gastrointestinal: Negative for  abdominal pain, change in bowel habit, nausea and vomiting.  Neurological: Negative for difficulty with concentration, dizziness, focal weakness and headaches.  Psychiatric/Behavioral: Negative for altered mental status and suicidal ideas.  All other systems reviewed and are negative.     Objective  Blood pressure 131/82, pulse (!) 103, height 5' 2"  (1.575 m), weight 136 lb (61.7 kg). Body mass index is 24.87 kg/m.    Physical exam not performed or limited due to virtual visit.  Patient appeared to be in no distress, Neck was supple, respiration was not labored.  Please see exam details from prior  visit is as below.   Physical Exam  Constitutional: She is oriented to person, place, and time. Vital signs are normal. She appears well-developed and well-nourished.  HENT:  Head: Normocephalic and atraumatic.  Neck: Normal range of motion.  Cardiovascular: Normal rate, regular rhythm, normal heart sounds and intact distal pulses.  Pulses:      Femoral pulses are 2+ on the right side and 2+ on the left side.      Popliteal pulses are 2+ on the right side and 2+ on the left side.       Dorsalis pedis pulses are 2+ on the right side and 2+ on the left side.       Posterior tibial pulses are 2+ on the right side and 2+ on the left side.  Acrocyanosis. Decreased capillary refill  Pulmonary/Chest: Effort normal and breath sounds normal. No accessory muscle usage. No respiratory distress.  Abdominal: Soft. Bowel sounds are normal.  Musculoskeletal: Normal range of motion.  Neurological: She is alert and oriented to person, place, and time.  Skin: Skin is warm, dry and intact.  Psychiatric: She has a normal mood and affect. Her behavior is normal.  Vitals reviewed.  Radiology: No results found.  Laboratory examination:   PCP labs 04/08/2018: Sodium 134, potassium 4.6, creatinine 0.79, EGFR 78, CMP normal.  RBC 4.03, MCH 33.2, platelets 417, CBC otherwise normal.  Ferritin 491, iron panel  otherwise normal.  10/03/2017: Cholesterol 134, triglycerides 155, HDL 55, LDL 48.  No flowsheet data found. No flowsheet data found. Lipid Panel  No results found for: CHOL, TRIG, HDL, CHOLHDL, VLDL, LDLCALC, LDLDIRECT HEMOGLOBIN A1C No results found for: HGBA1C, MPG TSH No results for input(s): TSH in the last 8760 hours.  Cardiac Studies:   Echocardiogram 08/06/2018: Normal LV systolic function with EF 57%. Left ventricle cavity is normal in size. Mild concentric hypertrophy of the left ventricle. Normal global wall motion. Doppler evidence of grade I (impaired) diastolic dysfunction, normal LAP. Calculated EF 57%. Mild (Grade I) mitral regurgitation. Mild tricuspid regurgitation.  No evidence of pulmonary hypertension.  Lexiscan Myoview Stress Test 07/29/2018: Lexiscan stress test was performed. Stress EKG is non-diagnostic as this is a pharmacological stress test. Normal left ventricular systolic function.  Stress LV EF: 81%.  Low risk study.  Assessment   Preoperative cardiovascular examination  Essential hypertension  Tobacco use disorder  Alcohol use disorder, mild, abuse  EKG 07/19/2018: Normal sinus rhythm at 83 bpm, leftward axis, poor R wave progression, cannot exclude anterior infarct old. T wave abnormality in anteroseptal leads, cannot exclude ischemia.  Recommendations:   I discussed recently obtained stress test results with the patient, low risk study.  Feel that patient may be taken for surgery low risk for perioperative complications.  Clearance letter has been sent to Ortho, she is waiting on PCP clearance letter to schedule surgery.  I discussed echocardiogram results, has grade 1 diastolic dysfunction and mild LVH, normal LVEF.  Suspect changes related to hypertension.  Discussed the importance of continued primary/secondary prevention measures.  Blood pressure is well controlled on current medical therapy, continue the same.  I have again stressed the  importance of smoking cessation for long-term benefits of risk reduction.  She will work on this.  Most days she does drink approximately 3-4 alcoholic drinks per day, but does try to skip some days.  I have encouraged her to work on this as well.  Discussed using support groups or AA meetings to help with this.  She  will continue to follow with her PCP for risk factor modifications.  I will see her back on a as needed basis for any new or worsening problems.   Miquel Dunn, MSN, APRN, FNP-C [facility]. [place] Office: [phone] Fax: [phone]

## 2018-08-15 ENCOUNTER — Encounter: Payer: Self-pay | Admitting: Cardiology

## 2018-08-16 DIAGNOSIS — M1612 Unilateral primary osteoarthritis, left hip: Secondary | ICD-10-CM | POA: Diagnosis not present

## 2018-08-16 DIAGNOSIS — F17200 Nicotine dependence, unspecified, uncomplicated: Secondary | ICD-10-CM | POA: Diagnosis not present

## 2018-08-16 DIAGNOSIS — I10 Essential (primary) hypertension: Secondary | ICD-10-CM | POA: Diagnosis not present

## 2018-08-16 DIAGNOSIS — J410 Simple chronic bronchitis: Secondary | ICD-10-CM | POA: Diagnosis not present

## 2018-08-16 DIAGNOSIS — Z01818 Encounter for other preprocedural examination: Secondary | ICD-10-CM | POA: Diagnosis not present

## 2018-08-22 DIAGNOSIS — M25552 Pain in left hip: Secondary | ICD-10-CM | POA: Diagnosis not present

## 2018-08-23 ENCOUNTER — Ambulatory Visit: Payer: Self-pay | Admitting: Physician Assistant

## 2018-08-23 NOTE — Progress Notes (Addendum)
Cardiac and medical clearance dr Colin Ina 08-16-18 on chart Cardiac clearnce note Jeri Lager fnp 08-07-2018 on chart lov Jeri Lager fnp 08-13-18 epic

## 2018-08-23 NOTE — Patient Instructions (Addendum)
Beverly [name]    Your procedure is scheduled on:09-06-2018   Report to [facility] Main  Entrance    Report to admitting at 1010 AM   [place] [facility] TEST ON 09-03-18  @ 3:30 PM, THIS TEST MUST BE DONE BEFORE SURGERY, COME TO [place]. ONCE TESTING IS COMPLETE, PLEASE BEGIN QUARANTINE  AS OUTLINED IN YOUR HANDOUT INSTRUCTIONS    Call this number if you have problems the morning of surgery [phone]    Remember:. NO SOLID FOOD AFTER MIDNIGHT THE NIGHT PRIOR TO SURGERY. NOTHING BY MOUTH EXCEPT CLEAR LIQUIDS UNTIL 3 HOURS PRIOR TO [place] SURGERY. PLEASE FINISH ENSURE DRINK PER SURGEON ORDER  WHICH NEEDS TO BE COMPLETED AT 4:30 AM.     CLEAR LIQUID DIET   Foods Allowed                                                                     Foods Excluded  Coffee and tea, regular and decaf                             liquids that you cannot  Plain Jell-O in any flavor                                             see through such as: Fruit ices (not with fruit pulp)                                     milk, soups, orange juice  Iced Popsicles                                    All solid food Carbonated beverages, regular and diet                                    Cranberry, grape and apple juices Sports drinks like Gatorade Lightly seasoned clear broth or consume(fat free) Sugar, honey syrup  Sample Menu Breakfast                                Lunch                                     Supper Cranberry juice                    Beef broth                            Chicken broth Jell-O  Grape juice                           Apple juice Coffee or tea                        Jell-O                                      Popsicle                                                Coffee or tea                        Coffee or  tea  _____________________________________________________________________    BRUSH YOUR TEETH MORNING OF SURGERY AND RINSE YOUR MOUTH OUT, NO CHEWING GUM CANDY OR MINTS.     Take these medicines the morning of surgery with A SIP OF WATER:  ADVAIR IF NEEDED AND BRING ADVAIR,                                 You may not have any metal on your body including hair pins and              piercings     Do not wear jewelry, make-up, lotions, powders or perfumes, deodorant              Do not wear nail polish.  Do not shave  48 hours prior to surgery.                Do not bring valuables to the hospital. [place].  Contacts, dentures or bridgework may not be worn into surgery.                     Please read over the following fact sheets you were given: _____________________________________________________________________             [facility] - Preparing for Surgery Before surgery, you can play an important role.  Because skin is not sterile, your skin needs to be as free of germs as possible.  You can reduce the number of germs on your skin by washing with CHG (chlorahexidine gluconate) soap before surgery.  CHG is an antiseptic cleaner which kills germs and bonds with the skin to continue killing germs even after washing. Please DO NOT use if you have an allergy to CHG or antibacterial soaps.  If your skin becomes reddened/irritated stop using the CHG and inform your nurse when you arrive at Short Stay. Do not shave (including legs and underarms) for at least 48 hours prior to the first CHG shower.  You may shave your face/neck. Please follow these instructions carefully:  1.  Shower with CHG Soap the night before surgery and the  morning of Surgery.  2.  If you choose to wash your hair, wash your hair first as usual with your  normal  shampoo.  3.  After you shampoo, rinse your hair and body  thoroughly to remove the  shampoo.                            4.  Use CHG as you would any other liquid soap.  You can apply chg directly  to the skin and wash                       Gently with a scrungie or clean washcloth.  5.  Apply the CHG Soap to your body ONLY FROM THE NECK DOWN.   Do not use on face/ open                           Wound or open sores. Avoid contact with eyes, ears mouth and genitals (private parts).                       Wash face,  Genitals (private parts) with your normal soap.             6.  Wash thoroughly, paying special attention to the area where your surgery  will be performed.  7.  Thoroughly rinse your body with warm water from the neck down.  8.  DO NOT shower/wash with your normal soap after using and rinsing off  the CHG Soap.                9.  Pat yourself dry with a clean towel.            10.  Wear clean pajamas.            11.  Place clean sheets on your bed the night of your first shower and do not  sleep with pets. Day of Surgery : Do not apply any lotions/deodorants the morning of surgery.  Please wear clean clothes to the hospital/surgery center.  FAILURE TO FOLLOW THESE INSTRUCTIONS MAY RESULT IN THE CANCELLATION OF YOUR SURGERY PATIENT SIGNATURE_________________________________  NURSE SIGNATURE__________________________________  ________________________________________________________________________   Beverly [name]  An incentive spirometer is a tool that can help keep your lungs clear and active. This tool measures how well you are filling your lungs with each breath. Taking long deep breaths may help reverse or decrease the chance of developing breathing (pulmonary) problems (especially infection) following:  A long period of time when you are unable to move or be active. BEFORE THE PROCEDURE   If the spirometer includes an indicator to show your best effort, your nurse or respiratory therapist will set it to a desired goal.  If possible, sit up straight or lean  slightly forward. Try not to slouch.  Hold the incentive spirometer in an upright position. INSTRUCTIONS FOR USE  1. Sit on the edge of your bed if possible, or sit up as far as you can in bed or on a chair. 2. Hold the incentive spirometer in an upright position. 3. Breathe out normally. 4. Place the mouthpiece in your mouth and seal your lips tightly around it. 5. Breathe in slowly and as deeply as possible, raising the piston or the ball toward the top of the column. 6. Hold your breath for 3-5 seconds or for as long as possible. Allow the piston or ball to fall to the bottom of the column. 7. Remove the mouthpiece from your mouth and breathe out normally. 8. Rest for a few seconds and repeat Steps 1 through  7 at least 10 times every 1-2 hours when you are awake. Take your time and take a few normal breaths between deep breaths. 9. The spirometer may include an indicator to show your best effort. Use the indicator as a goal to work toward during each repetition. 10. After each set of 10 deep breaths, practice coughing to be sure your lungs are clear. If you have an incision (the cut made at the time of surgery), support your incision when coughing by placing a pillow or rolled up towels firmly against it. Once you are able to get out of bed, walk around indoors and cough well. You may stop using the incentive spirometer when instructed by your caregiver.  RISKS AND COMPLICATIONS  Take your time so you do not get dizzy or light-headed.  If you are in pain, you may need to take or ask for pain medication before doing incentive spirometry. It is harder to take a deep breath if you are having pain. AFTER USE  Rest and breathe slowly and easily.  It can be helpful to keep track of a log of your progress. Your caregiver can provide you with a simple table to help with this. If you are using the spirometer at home, follow these instructions: [place] IF:   You are having difficultly  using the spirometer.  You have trouble using the spirometer as often as instructed.  Your pain medication is not giving enough relief while using the spirometer.  You develop fever of 100.5 F (38.1 C) or higher. SEEK IMMEDIATE MEDICAL CARE IF:   You cough up bloody sputum that had not been present before.  You develop fever of 102 F (38.9 C) or greater.  You develop worsening pain at or near the incision site. MAKE SURE YOU:   Understand these instructions.  Will watch your condition.  Will get help right away if you are not doing well or get worse. Document Released: 07/10/2006 Document Revised: 05/22/2011 Document Reviewed: 09/10/2006 [facility] Patient Information 2014 [place], [place].   ________________________________________________________________________

## 2018-08-23 NOTE — H&P (View-Only) (Signed)
TOTAL HIP ADMISSION H&P  Patient is admitted for left total hip arthroplasty.  Subjective:  Chief Complaint: left hip pain  HPI: Beverly [name], 68 y.o. female, has a history of pain and functional disability in the left hip(s) due to arthritis and patient has failed non-surgical conservative treatments for greater than 12 weeks to include NSAID's and/or analgesics, use of assistive devices and activity modification.  Onset of symptoms was gradual starting 8 years ago with gradually worsening course since that time.The patient noted no past surgery on the left hip(s).  Patient currently rates pain in the left hip at 8 out of 10 with activity. Patient has night pain, worsening of pain with activity and weight bearing, trendelenberg gait and pain that interfers with activities of daily living. Patient has evidence of periarticular osteophytes and joint space narrowing by imaging studies. This condition presents safety issues increasing the risk of falls.  There is no current active infection.  Patient Active Problem List   Diagnosis Date Noted  . B12 deficiency 02/01/2018  . DDD (degenerative disc disease), lumbar 01/03/2018  . Hyponatremia 10/04/2017  . Thrombocytosis ([place]) 10/04/2017  . Chronic respiratory failure with hypoxia ([place]) 09/01/2017  . Dyslipidemia, goal LDL below 70 12/06/2016  . Aortic atherosclerosis ([place]) 12/06/2016  . Diverticulosis 04/11/2016  . Essential hypertension 10/03/2013  . Current smoker 10/03/2013  . Esophageal reflux 10/03/2013  . Anxiety disorder 10/03/2013  . Allergic rhinitis 10/03/2013  . Insomnia 07/17/2013   Past Medical History:  Diagnosis Date  . Hyperlipidemia   . Hypertension     No past surgical history on file.  Current Outpatient Medications  Medication Sig Dispense Refill Last Dose  . acetaminophen (TYLENOL) 500 MG tablet Take 500 mg by mouth every 6 (six) hours as needed for moderate pain.    Taking  . ADVAIR HFA 45-21 MCG/ACT inhaler  Inhale 2 puffs into the lungs daily as needed (shortness of breath or wheezing).    Taking  . alendronate (FOSAMAX) 70 MG tablet Take 70 mg by mouth once a week.    Taking  . atorvastatin (LIPITOR) 20 MG tablet Take 20 mg by mouth daily.    Taking  . celecoxib (CELEBREX) 200 MG capsule Take 200 mg by mouth daily as needed for moderate pain.    Taking  . Cholecalciferol (VITAMIN D3) 50 MCG (2000 UT) TABS Take 2,000 Units by mouth daily.    Taking  . ezetimibe (ZETIA) 10 MG tablet Take 10 mg by mouth daily.    Taking  . fenofibrate 160 MG tablet Take 160 mg by mouth daily.    Taking  . hydroxypropyl methylcellulose / hypromellose (ISOPTO TEARS / GONIOVISC) 2.5 % ophthalmic solution Place 1 drop into both eyes 2 (two) times daily as needed for dry eyes.     Marland Kitchen losartan (COZAAR) 100 MG tablet Take 100 mg by mouth daily.    Taking  . metoprolol succinate (TOPROL-XL) 100 MG 24 hr tablet Take 100 mg by mouth daily.    Taking  . traMADol (ULTRAM) 50 MG tablet Take 50 mg by mouth every 6 (six) hours as needed for moderate pain.    Taking  . venlafaxine XR (EFFEXOR-XR) 37.5 MG 24 hr capsule Take 37.5 mg by mouth daily with breakfast.    Taking  . vitamin B-12 (CYANOCOBALAMIN) 1000 MCG tablet Take 1,000 mcg by mouth daily.   Taking   No current facility-administered medications for this visit.    No Known Allergies  Social History  Tobacco Use  . Smoking status: Current Every Day Smoker    Packs/day: 0.50    Types: Cigarettes  . Smokeless tobacco: Never Used  Substance Use Topics  . Alcohol use: Yes    Comment: occ    Family History  Problem Relation Age of Onset  . Heart attack Father      Review of Systems  Cardiovascular: Positive for leg swelling.  Musculoskeletal: Positive for joint pain.  Endo/Heme/Allergies: Bruises/bleeds easily.  All other systems reviewed and are negative.   Objective:  Physical Exam  Constitutional: She is oriented to person, place, and time. She appears  well-developed and well-nourished. No distress.  HENT:  Head: Normocephalic and atraumatic.  Nose: Nose normal.  Eyes: Pupils are equal, round, and reactive to light. Conjunctivae and EOM are normal.  Neck: Normal range of motion. Neck supple.  Cardiovascular: Normal rate, regular rhythm, normal heart sounds and intact distal pulses.  Respiratory: Effort normal and breath sounds normal. No respiratory distress. She has no wheezes.  GI: Soft. Bowel sounds are normal. She exhibits no distension. There is no abdominal tenderness.  Musculoskeletal:     Left hip: She exhibits decreased range of motion, decreased strength, tenderness and bony tenderness.  Lymphadenopathy:    She has no cervical adenopathy.  Neurological: She is alert and oriented to person, place, and time. No cranial nerve deficit.  Skin: Skin is warm and dry. No rash noted. No erythema.  Psychiatric: She has a normal mood and affect. Her behavior is normal.    Vital signs in last 24 hours: @VSRANGES @  Labs:   Estimated body mass index is 24.87 kg/m as calculated from the following:   Height as of 08/14/18: 5\' 2"  (1.575 m).   Weight as of 08/14/18: 61.7 kg.   Imaging Review Plain radiographs demonstrate moderate degenerative joint disease of the left hip(s). The bone quality appears to be good for age and reported activity level.      Assessment/Plan:  End stage arthritis, left hip(s)  The patient history, physical examination, clinical judgement of the provider and imaging studies are consistent with end stage degenerative joint disease of the left hip(s) and total hip arthroplasty is deemed medically necessary. The treatment options including medical management, injection therapy, arthroscopy and arthroplasty were discussed at length. The risks and benefits of total hip arthroplasty were presented and reviewed. The risks due to aseptic loosening, infection, stiffness, dislocation/subluxation,  thromboembolic  complications and other imponderables were discussed.  The patient acknowledged the explanation, agreed to proceed with the plan and consent was signed. Patient is being admitted for inpatient treatment for surgery, pain control, PT, OT, prophylactic antibiotics, VTE prophylaxis, progressive ambulation and ADL's and discharge planning.The patient is planning to be discharged home with outpatient PT   Anticipated LOS equal to or greater than 2 midnights due to - Age 40 and older with one or more of the following:  - Obesity  - Expected need for hospital services (PT, OT, Nursing) required for safe  discharge  - Anticipated need for postoperative skilled nursing care or inpatient rehab  - Active co-morbidities: None OR   - Unanticipated findings during/Post Surgery: None  - Patient is a high risk of re-admission due to: None

## 2018-08-23 NOTE — Progress Notes (Signed)
Ok to use 07-19-18 ekg per [place] zanetto pa

## 2018-08-23 NOTE — H&P (Signed)
TOTAL HIP ADMISSION H&P  Patient is admitted for left total hip arthroplasty.  Subjective:  Chief Complaint: left hip pain  HPI: Beverly [name], 68 y.o. female, has a history of pain and functional disability in the left hip(s) due to arthritis and patient has failed non-surgical conservative treatments for greater than 12 weeks to include NSAID's and/or analgesics, use of assistive devices and activity modification.  Onset of symptoms was gradual starting 8 years ago with gradually worsening course since that time.The patient noted no past surgery on the left hip(s).  Patient currently rates pain in the left hip at 8 out of 10 with activity. Patient has night pain, worsening of pain with activity and weight bearing, trendelenberg gait and pain that interfers with activities of daily living. Patient has evidence of periarticular osteophytes and joint space narrowing by imaging studies. This condition presents safety issues increasing the risk of falls.  There is no current active infection.  Patient Active Problem List   Diagnosis Date Noted  . B12 deficiency 02/01/2018  . DDD (degenerative disc disease), lumbar 01/03/2018  . Hyponatremia 10/04/2017  . Thrombocytosis ([place]) 10/04/2017  . Chronic respiratory failure with hypoxia ([place]) 09/01/2017  . Dyslipidemia, goal LDL below 70 12/06/2016  . Aortic atherosclerosis ([place]) 12/06/2016  . Diverticulosis 04/11/2016  . Essential hypertension 10/03/2013  . Current smoker 10/03/2013  . Esophageal reflux 10/03/2013  . Anxiety disorder 10/03/2013  . Allergic rhinitis 10/03/2013  . Insomnia 07/17/2013   Past Medical History:  Diagnosis Date  . Hyperlipidemia   . Hypertension     No past surgical history on file.  Current Outpatient Medications  Medication Sig Dispense Refill Last Dose  . acetaminophen (TYLENOL) 500 MG tablet Take 500 mg by mouth every 6 (six) hours as needed for moderate pain.    Taking  . ADVAIR HFA 45-21 MCG/ACT inhaler  Inhale 2 puffs into the lungs daily as needed (shortness of breath or wheezing).    Taking  . alendronate (FOSAMAX) 70 MG tablet Take 70 mg by mouth once a week.    Taking  . atorvastatin (LIPITOR) 20 MG tablet Take 20 mg by mouth daily.    Taking  . celecoxib (CELEBREX) 200 MG capsule Take 200 mg by mouth daily as needed for moderate pain.    Taking  . Cholecalciferol (VITAMIN D3) 50 MCG (2000 UT) TABS Take 2,000 Units by mouth daily.    Taking  . ezetimibe (ZETIA) 10 MG tablet Take 10 mg by mouth daily.    Taking  . fenofibrate 160 MG tablet Take 160 mg by mouth daily.    Taking  . hydroxypropyl methylcellulose / hypromellose (ISOPTO TEARS / GONIOVISC) 2.5 % ophthalmic solution Place 1 drop into both eyes 2 (two) times daily as needed for dry eyes.     Marland Kitchen losartan (COZAAR) 100 MG tablet Take 100 mg by mouth daily.    Taking  . metoprolol succinate (TOPROL-XL) 100 MG 24 hr tablet Take 100 mg by mouth daily.    Taking  . traMADol (ULTRAM) 50 MG tablet Take 50 mg by mouth every 6 (six) hours as needed for moderate pain.    Taking  . venlafaxine XR (EFFEXOR-XR) 37.5 MG 24 hr capsule Take 37.5 mg by mouth daily with breakfast.    Taking  . vitamin B-12 (CYANOCOBALAMIN) 1000 MCG tablet Take 1,000 mcg by mouth daily.   Taking   No current facility-administered medications for this visit.    No Known Allergies  Social History  Tobacco Use  . Smoking status: Current Every Day Smoker    Packs/day: 0.50    Types: Cigarettes  . Smokeless tobacco: Never Used  Substance Use Topics  . Alcohol use: Yes    Comment: occ    Family History  Problem Relation Age of Onset  . Heart attack Father      Review of Systems  Cardiovascular: Positive for leg swelling.  Musculoskeletal: Positive for joint pain.  Endo/Heme/Allergies: Bruises/bleeds easily.  All other systems reviewed and are negative.   Objective:  Physical Exam  Constitutional: She is oriented to person, place, and time. She appears  well-developed and well-nourished. No distress.  HENT:  Head: Normocephalic and atraumatic.  Nose: Nose normal.  Eyes: Pupils are equal, round, and reactive to light. Conjunctivae and EOM are normal.  Neck: Normal range of motion. Neck supple.  Cardiovascular: Normal rate, regular rhythm, normal heart sounds and intact distal pulses.  Respiratory: Effort normal and breath sounds normal. No respiratory distress. She has no wheezes.  GI: Soft. Bowel sounds are normal. She exhibits no distension. There is no abdominal tenderness.  Musculoskeletal:     Left hip: She exhibits decreased range of motion, decreased strength, tenderness and bony tenderness.  Lymphadenopathy:    She has no cervical adenopathy.  Neurological: She is alert and oriented to person, place, and time. No cranial nerve deficit.  Skin: Skin is warm and dry. No rash noted. No erythema.  Psychiatric: She has a normal mood and affect. Her behavior is normal.    Vital signs in last 24 hours: @VSRANGES @  Labs:   Estimated body mass index is 24.87 kg/m as calculated from the following:   Height as of 08/14/18: 5\' 2"  (1.575 m).   Weight as of 08/14/18: 61.7 kg.   Imaging Review Plain radiographs demonstrate moderate degenerative joint disease of the left hip(s). The bone quality appears to be good for age and reported activity level.      Assessment/Plan:  End stage arthritis, left hip(s)  The patient history, physical examination, clinical judgement of the provider and imaging studies are consistent with end stage degenerative joint disease of the left hip(s) and total hip arthroplasty is deemed medically necessary. The treatment options including medical management, injection therapy, arthroscopy and arthroplasty were discussed at length. The risks and benefits of total hip arthroplasty were presented and reviewed. The risks due to aseptic loosening, infection, stiffness, dislocation/subluxation,  thromboembolic  complications and other imponderables were discussed.  The patient acknowledged the explanation, agreed to proceed with the plan and consent was signed. Patient is being admitted for inpatient treatment for surgery, pain control, PT, OT, prophylactic antibiotics, VTE prophylaxis, progressive ambulation and ADL's and discharge planning.The patient is planning to be discharged home with outpatient PT   Anticipated LOS equal to or greater than 2 midnights due to - Age 46 and older with one or more of the following:  - Obesity  - Expected need for hospital services (PT, OT, Nursing) required for safe  discharge  - Anticipated need for postoperative skilled nursing care or inpatient rehab  - Active co-morbidities: None OR   - Unanticipated findings during/Post Surgery: None  - Patient is a high risk of re-admission due to: None

## 2018-08-23 NOTE — Progress Notes (Addendum)
ECHO 08-06-18 Epic EKG 07-19-18 Epic CALLED KELLY HIGH FOR CARDIAC CLEARANCE NOTE

## 2018-08-26 ENCOUNTER — Encounter (HOSPITAL_COMMUNITY): Payer: Self-pay

## 2018-08-26 ENCOUNTER — Encounter (HOSPITAL_COMMUNITY)
Admission: RE | Admit: 2018-08-26 | Discharge: 2018-08-26 | Disposition: A | Discharge status: Home / Self Care | Payer: PPO | Source: Ambulatory Visit | Attending: Orthopedic Surgery | Admitting: Orthopedic Surgery

## 2018-08-26 ENCOUNTER — Other Ambulatory Visit: Payer: Self-pay

## 2018-08-26 DIAGNOSIS — Z01812 Encounter for preprocedural laboratory examination: Secondary | ICD-10-CM | POA: Insufficient documentation

## 2018-08-26 DIAGNOSIS — M1612 Unilateral primary osteoarthritis, left hip: Secondary | ICD-10-CM | POA: Insufficient documentation

## 2018-08-26 HISTORY — DX: Malignant (primary) neoplasm, unspecified: C80.1

## 2018-08-26 LAB — CBC WITH DIFFERENTIAL/PLATELET
Abs Immature Granulocytes: 0.02 10*3/uL (ref 0.00–0.07)
Basophils Absolute: 0.1 10*3/uL (ref 0.0–0.1)
Basophils Relative: 1 %
Eosinophils Absolute: 0.2 10*3/uL (ref 0.0–0.5)
Eosinophils Relative: 4 %
HCT: 37.8 % (ref 36.0–46.0)
Hemoglobin: 12.6 g/dL (ref 12.0–15.0)
Immature Granulocytes: 0 %
Lymphocytes Relative: 29 %
Lymphs Abs: 1.7 10*3/uL (ref 0.7–4.0)
MCH: 32.6 pg (ref 26.0–34.0)
MCHC: 33.3 g/dL (ref 30.0–36.0)
MCV: 97.9 fL (ref 80.0–100.0)
Monocytes Absolute: 0.5 10*3/uL (ref 0.1–1.0)
Monocytes Relative: 9 %
Neutro Abs: 3.3 10*3/uL (ref 1.7–7.7)
Neutrophils Relative %: 57 %
Platelets: 435 10*3/uL — ABNORMAL HIGH (ref 150–400)
RBC: 3.86 MIL/uL — ABNORMAL LOW (ref 3.87–5.11)
RDW: 11.9 % (ref 11.5–15.5)
WBC: 5.8 10*3/uL (ref 4.0–10.5)
nRBC: 0 % (ref 0.0–0.2)

## 2018-08-26 LAB — TYPE AND SCREEN
ABO/RH(D): B POS
Antibody Screen: NEGATIVE

## 2018-08-26 LAB — COMPREHENSIVE METABOLIC PANEL
ALT: 20 U/L (ref 0–44)
AST: 22 U/L (ref 15–41)
Albumin: 4.3 g/dL (ref 3.5–5.0)
Alkaline Phosphatase: 38 U/L (ref 38–126)
Anion gap: 8 (ref 5–15)
BUN: 16 mg/dL (ref 8–23)
CO2: 26 mmol/L (ref 22–32)
Calcium: 9.2 mg/dL (ref 8.9–10.3)
Chloride: 102 mmol/L (ref 98–111)
Creatinine, Ser: 0.93 mg/dL (ref 0.44–1.00)
GFR calc Af Amer: >60 mL/min (ref >60)
GFR calc non Af Amer: >60 mL/min (ref >60)
Glucose, Bld: 100 mg/dL — ABNORMAL HIGH (ref 70–99)
Potassium: 4.8 mmol/L (ref 3.5–5.1)
Sodium: 136 mmol/L (ref 135–145)
Total Bilirubin: 0.4 mg/dL (ref 0.3–1.2)
Total Protein: 7 g/dL (ref 6.5–8.1)

## 2018-08-26 LAB — SURGICAL PCR SCREEN
MRSA, PCR: NEGATIVE
Staphylococcus aureus: NEGATIVE

## 2018-08-26 LAB — ABO/RH: ABO/RH(D): B POS

## 2018-08-29 NOTE — Anesthesia Preprocedure Evaluation (Addendum)
Anesthesia Evaluation  Patient identified by MRN, date of birth, ID band Patient awake    Reviewed: Allergy & Precautions, NPO status , Patient's Chart, lab work & pertinent test results, reviewed documented beta blocker date and time   Airway Mallampati: III  TM Distance: >3 FB Neck ROM: Full    Dental no notable dental hx. (+) Teeth Intact, Dental Advisory Given   Pulmonary COPD, Current Smoker,    Pulmonary exam normal breath sounds clear to auscultation       Cardiovascular hypertension, Pt. on home beta blockers and Pt. on medications negative cardio ROS Normal cardiovascular exam Rhythm:Regular Rate:Normal     Neuro/Psych PSYCHIATRIC DISORDERS Anxiety negative neurological ROS     GI/Hepatic Neg liver ROS, GERD  ,  Endo/Other  negative endocrine ROS  Renal/GU negative Renal ROS  negative genitourinary   Musculoskeletal  (+) Arthritis , Osteoarthritis,    Abdominal   Peds  Hematology negative hematology ROS (+)   Anesthesia Other Findings   Reproductive/Obstetrics                          Anesthesia Physical Anesthesia Plan  ASA: III  Anesthesia Plan: Spinal   Post-op Pain Management:  Regional for Post-op pain   Induction: Intravenous  PONV Risk Score and Plan: 2 and Propofol infusion, Midazolam, Treatment may vary due to age or medical condition, Ondansetron and Dexamethasone  Airway Management Planned: Natural Airway  Additional Equipment:   Intra-op Plan:   Post-operative Plan:   Informed Consent: I have reviewed the patients History and Physical, chart, labs and discussed the procedure including the risks, benefits and alternatives for the proposed anesthesia with the patient or authorized representative who has indicated his/her understanding and acceptance.     Dental advisory given  Plan Discussed with: CRNA  Anesthesia Plan Comments:        Anesthesia  Quick Evaluation

## 2018-08-29 NOTE — Progress Notes (Signed)
Anesthesia Chart Review   Case: [id number] Date/Time: 09/06/18 1228   Procedure: TOTAL HIP ARTHROPLASTY (Left )   Anesthesia type: Choice   Pre-op diagnosis: OA LEFT HIP   Location: [place] / [facility]   Surgeon: Earlie Server, MD      DISCUSSION: 68 yo current every day smoker (20 pack years) with h/o COPD, HTN, HLD, Left hip OA scheduled for above procedure 09/06/2018 with Dr. Earlie Server.   Pt seen by Dr. Gerarda Fraction, 08/16/2018 for preoperative evaluation.  Per OV note, "I am not familiar with patient but based on today's examination it appears that her medical conditions are well controlled. I find the patient is clinically stable to undergo planned procedure with the following precautions. This patient has a history of COPD and active smoking. She has not had a sleep study so I recommend caution regarding sedation and she will need close monitoring regarding pulmonary status. She is to discontinue use of NSAIDs 7 days prior to surgery. She will take blood pressure medications, continue use of inhalers morning of surgery. Ideally she should quit smoking at least 4 weeks prior to surgery but she is pre-contemplative. I also recommend that surgical team avoid skipping a dose of venlafaxine during the perioperative period as this may result in discontinuation syndrome and delirium in the hospital setting"  Pt can proceed with planned procedure barring acute status change.  VS: BP (!) 146/84   Pulse 81   Temp 36.8 C (Oral)   Resp 16   Ht 5\' 2"  (1.575 m)   Wt 64 kg   SpO2 100%   BMI 25.81 kg/m   PROVIDERS: Thomes Dinning, MD is PCP    LABS: Labs reviewed: Acceptable for surgery. (all labs ordered are listed, but only abnormal results are displayed)  Labs Reviewed  CBC WITH DIFFERENTIAL/PLATELET - Abnormal; Notable for the following components:      Result Value   RBC 3.86 (*)    Platelets 435 (*)    All other components within normal limits  COMPREHENSIVE METABOLIC PANEL -  Abnormal; Notable for the following components:   Glucose, Bld 100 (*)    All other components within normal limits  SURGICAL PCR SCREEN  TYPE AND SCREEN  ABO/RH     IMAGES:   EKG: EKG 07/19/2018:  Rate 83 bpm Normal sinus rhythm  leftward axis, poor R wave progression, cannot exclude anterior infarct old. T wave abnormality in anteroseptal leads, cannot exclude ischemia.  CV:  Past Medical History:  Diagnosis Date  . Cancer [facility])    Skin cancer  . Hyperlipidemia   . Hypertension     Past Surgical History:  Procedure Laterality Date  . COLONOSCOPY    . SALPINGOOPHORECTOMY    . UTERINE ARTERY EMBOLIZATION      MEDICATIONS: . acetaminophen (TYLENOL) 500 MG tablet  . ADVAIR HFA 45-21 MCG/ACT inhaler  . alendronate (FOSAMAX) 70 MG tablet  . atorvastatin (LIPITOR) 20 MG tablet  . celecoxib (CELEBREX) 200 MG capsule  . Cholecalciferol (VITAMIN D3) 50 MCG (2000 UT) TABS  . ezetimibe (ZETIA) 10 MG tablet  . fenofibrate 160 MG tablet  . hydroxypropyl methylcellulose / hypromellose (ISOPTO TEARS / GONIOVISC) 2.5 % ophthalmic solution  . losartan (COZAAR) 100 MG tablet  . metoprolol succinate (TOPROL-XL) 100 MG 24 hr tablet  . traMADol (ULTRAM) 50 MG tablet  . venlafaxine XR (EFFEXOR-XR) 37.5 MG 24 hr capsule  . vitamin B-12 (CYANOCOBALAMIN) 1000 MCG tablet   No current facility-administered medications  for this encounter.     Maia Plan [facility] Pre-Surgical Testing [phone] 08/29/18 1:25 PM

## 2018-09-03 ENCOUNTER — Other Ambulatory Visit (HOSPITAL_COMMUNITY)
Admission: RE | Admit: 2018-09-03 | Discharge: 2018-09-03 | Disposition: A | Discharge status: Home / Self Care | Payer: PPO | Source: Ambulatory Visit | Attending: Orthopedic Surgery | Admitting: Orthopedic Surgery

## 2018-09-03 DIAGNOSIS — Z1159 Encounter for screening for other viral diseases: Secondary | ICD-10-CM | POA: Diagnosis not present

## 2018-09-03 LAB — SARS CORONAVIRUS 2 (TAT 6-24 HRS): SARS Coronavirus 2: NEGATIVE

## 2018-09-05 MED ORDER — BUPIVACAINE LIPOSOME 1.3 % IJ SUSP
10.0000 mL | Freq: Once | INTRAMUSCULAR | Status: DC
  Filled 2018-09-05: qty 10

## 2018-09-05 MED ORDER — TRANEXAMIC ACID 1000 MG/10ML IV SOLN
2000.0000 mg | INTRAVENOUS | Status: DC
  Filled 2018-09-05: qty 20

## 2018-09-06 ENCOUNTER — Other Ambulatory Visit: Payer: Self-pay

## 2018-09-06 ENCOUNTER — Ambulatory Visit (HOSPITAL_COMMUNITY): Payer: PPO | Admitting: Certified Registered Nurse Anesthetist

## 2018-09-06 ENCOUNTER — Ambulatory Visit (HOSPITAL_COMMUNITY): Payer: PPO | Admitting: Physician Assistant

## 2018-09-06 ENCOUNTER — Observation Stay (HOSPITAL_COMMUNITY): Payer: PPO

## 2018-09-06 ENCOUNTER — Encounter (HOSPITAL_COMMUNITY): Payer: Self-pay | Admitting: General Practice

## 2018-09-06 ENCOUNTER — Inpatient Hospital Stay (HOSPITAL_COMMUNITY)
Admission: RE | Admit: 2018-09-06 | Discharge: 2018-09-08 | DRG: 470 | Disposition: A | Discharge status: Home Health | Payer: PPO | Attending: Orthopedic Surgery | Admitting: Orthopedic Surgery

## 2018-09-06 ENCOUNTER — Encounter (HOSPITAL_COMMUNITY)
Admission: RE | Disposition: A | Discharge status: Home Health | Payer: Self-pay | Source: Home / Self Care | Attending: Orthopedic Surgery

## 2018-09-06 DIAGNOSIS — Z7983 Long term (current) use of bisphosphonates: Secondary | ICD-10-CM

## 2018-09-06 DIAGNOSIS — I700 Atherosclerosis of aorta: Secondary | ICD-10-CM

## 2018-09-06 DIAGNOSIS — K219 Gastro-esophageal reflux disease without esophagitis: Secondary | ICD-10-CM | POA: Diagnosis present

## 2018-09-06 DIAGNOSIS — Z79899 Other long term (current) drug therapy: Secondary | ICD-10-CM

## 2018-09-06 DIAGNOSIS — Z96642 Presence of left artificial hip joint: Secondary | ICD-10-CM | POA: Diagnosis not present

## 2018-09-06 DIAGNOSIS — M62838 Other muscle spasm: Secondary | ICD-10-CM | POA: Diagnosis present

## 2018-09-06 DIAGNOSIS — E785 Hyperlipidemia, unspecified: Secondary | ICD-10-CM | POA: Diagnosis present

## 2018-09-06 DIAGNOSIS — F17210 Nicotine dependence, cigarettes, uncomplicated: Secondary | ICD-10-CM | POA: Diagnosis present

## 2018-09-06 DIAGNOSIS — Z96649 Presence of unspecified artificial hip joint: Secondary | ICD-10-CM

## 2018-09-06 DIAGNOSIS — Z7951 Long term (current) use of inhaled steroids: Secondary | ICD-10-CM

## 2018-09-06 DIAGNOSIS — Z85828 Personal history of other malignant neoplasm of skin: Secondary | ICD-10-CM | POA: Diagnosis not present

## 2018-09-06 DIAGNOSIS — F17200 Nicotine dependence, unspecified, uncomplicated: Secondary | ICD-10-CM

## 2018-09-06 DIAGNOSIS — I10 Essential (primary) hypertension: Secondary | ICD-10-CM | POA: Diagnosis present

## 2018-09-06 DIAGNOSIS — R2689 Other abnormalities of gait and mobility: Secondary | ICD-10-CM | POA: Diagnosis not present

## 2018-09-06 DIAGNOSIS — M6588 Other synovitis and tenosynovitis, other site: Secondary | ICD-10-CM | POA: Diagnosis not present

## 2018-09-06 DIAGNOSIS — Z471 Aftercare following joint replacement surgery: Secondary | ICD-10-CM | POA: Diagnosis not present

## 2018-09-06 DIAGNOSIS — J449 Chronic obstructive pulmonary disease, unspecified: Secondary | ICD-10-CM | POA: Diagnosis not present

## 2018-09-06 DIAGNOSIS — M79606 Pain in leg, unspecified: Secondary | ICD-10-CM

## 2018-09-06 DIAGNOSIS — J9611 Chronic respiratory failure with hypoxia: Secondary | ICD-10-CM

## 2018-09-06 DIAGNOSIS — M1612 Unilateral primary osteoarthritis, left hip: Secondary | ICD-10-CM | POA: Diagnosis present

## 2018-09-06 HISTORY — PX: TOTAL HIP ARTHROPLASTY: SHX124

## 2018-09-06 SURGERY — ARTHROPLASTY, HIP, TOTAL,POSTERIOR APPROACH
Anesthesia: Spinal | Site: Hip | Laterality: Left

## 2018-09-06 MED ORDER — FENTANYL CITRATE (PF) 100 MCG/2ML IJ SOLN
INTRAMUSCULAR | Status: AC
  Filled 2018-09-06: qty 2

## 2018-09-06 MED ORDER — PHENYLEPHRINE 40 MCG/ML (10ML) SYRINGE FOR IV PUSH (FOR BLOOD PRESSURE SUPPORT)
PREFILLED_SYRINGE | INTRAVENOUS | Status: DC | PRN
  Administered 2018-09-06: 100 ug via INTRAVENOUS

## 2018-09-06 MED ORDER — METOPROLOL SUCCINATE ER 50 MG PO TB24
100.0000 mg | ORAL_TABLET | Freq: Every day | ORAL | Status: DC
  Administered 2018-09-06 – 2018-09-07 (×2): 100 mg via ORAL
  Filled 2018-09-06 (×2): qty 2

## 2018-09-06 MED ORDER — MIDAZOLAM HCL 2 MG/2ML IJ SOLN
INTRAMUSCULAR | Status: DC | PRN
  Administered 2018-09-06: 2 mg via INTRAVENOUS

## 2018-09-06 MED ORDER — DIPHENHYDRAMINE HCL 12.5 MG/5ML PO ELIX
12.5000 mg | ORAL_SOLUTION | ORAL | Status: DC | PRN
  Administered 2018-09-07: 12.5 mg via ORAL
  Filled 2018-09-06: qty 5

## 2018-09-06 MED ORDER — HYDROMORPHONE HCL 1 MG/ML IJ SOLN
0.5000 mg | INTRAMUSCULAR | Status: DC | PRN
  Administered 2018-09-06 – 2018-09-07 (×3): 0.5 mg via INTRAVENOUS
  Filled 2018-09-06 (×3): qty 0.5

## 2018-09-06 MED ORDER — SORBITOL 70 % SOLN
30.0000 mL | Freq: Every day | Status: DC | PRN
  Filled 2018-09-06: qty 30

## 2018-09-06 MED ORDER — LOSARTAN POTASSIUM 50 MG PO TABS
100.0000 mg | ORAL_TABLET | Freq: Every day | ORAL | Status: DC
  Administered 2018-09-06 – 2018-09-07 (×2): 100 mg via ORAL
  Filled 2018-09-06 (×2): qty 2

## 2018-09-06 MED ORDER — PNEUMOCOCCAL VAC POLYVALENT 25 MCG/0.5ML IJ INJ
0.5000 mL | INJECTION | INTRAMUSCULAR | Status: DC
  Filled 2018-09-06: qty 0.5

## 2018-09-06 MED ORDER — OXYCODONE HCL 5 MG PO TABS: ORAL_TABLET | ORAL | 0 refills | Status: DC

## 2018-09-06 MED ORDER — POLYVINYL ALCOHOL 1.4 % OP SOLN: 1.0000 [drp] | OPHTHALMIC | Status: DC | PRN

## 2018-09-06 MED ORDER — PROPOFOL 10 MG/ML IV BOLUS
INTRAVENOUS | Status: DC | PRN
  Administered 2018-09-06 (×2): 10 mg via INTRAVENOUS
  Administered 2018-09-06: 20 mg via INTRAVENOUS
  Administered 2018-09-06 (×3): 10 mg via INTRAVENOUS

## 2018-09-06 MED ORDER — OXYCODONE HCL 5 MG PO TABS
5.0000 mg | ORAL_TABLET | ORAL | Status: DC | PRN
  Administered 2018-09-06: 5 mg via ORAL
  Administered 2018-09-07: 10 mg via ORAL
  Administered 2018-09-07: 5 mg via ORAL
  Administered 2018-09-07: 10 mg via ORAL
  Administered 2018-09-07: 5 mg via ORAL
  Administered 2018-09-07 – 2018-09-08 (×4): 10 mg via ORAL
  Administered 2018-09-08: 5 mg via ORAL
  Filled 2018-09-06 (×3): qty 2
  Filled 2018-09-06 (×3): qty 1
  Filled 2018-09-06 (×6): qty 2

## 2018-09-06 MED ORDER — ONDANSETRON HCL 4 MG PO TABS: 4.0000 mg | ORAL_TABLET | Freq: Four times a day (QID) | ORAL | Status: DC | PRN

## 2018-09-06 MED ORDER — BUPIVACAINE LIPOSOME 1.3 % IJ SUSP
INTRAMUSCULAR | Status: DC | PRN
  Administered 2018-09-06: 10 mL

## 2018-09-06 MED ORDER — PHENYLEPHRINE HCL (PRESSORS) 10 MG/ML IV SOLN
INTRAVENOUS | Status: AC
  Filled 2018-09-06: qty 1

## 2018-09-06 MED ORDER — BUPIVACAINE IN DEXTROSE 0.75-8.25 % IT SOLN
INTRATHECAL | Status: DC | PRN
  Administered 2018-09-06: 1.6 mL via INTRATHECAL

## 2018-09-06 MED ORDER — MOMETASONE FURO-FORMOTEROL FUM 100-5 MCG/ACT IN AERO
2.0000 | INHALATION_SPRAY | Freq: Two times a day (BID) | RESPIRATORY_TRACT | Status: DC
  Administered 2018-09-07 – 2018-09-08 (×3): 2 via RESPIRATORY_TRACT
  Filled 2018-09-06: qty 8.8

## 2018-09-06 MED ORDER — POVIDONE-IODINE 10 % EX SWAB: 2.0000 "application " | Freq: Once | CUTANEOUS | Status: DC

## 2018-09-06 MED ORDER — ONDANSETRON HCL 4 MG/2ML IJ SOLN
INTRAMUSCULAR | Status: AC
  Filled 2018-09-06: qty 2

## 2018-09-06 MED ORDER — FENTANYL CITRATE (PF) 100 MCG/2ML IJ SOLN
INTRAMUSCULAR | Status: DC | PRN
  Administered 2018-09-06: 50 ug via INTRATHECAL
  Administered 2018-09-06: 25 ug via INTRATHECAL
  Administered 2018-09-06: 25 ug via INTRAVENOUS

## 2018-09-06 MED ORDER — PROPOFOL 10 MG/ML IV BOLUS
INTRAVENOUS | Status: AC
  Filled 2018-09-06: qty 80

## 2018-09-06 MED ORDER — FLEET ENEMA 7-19 GM/118ML RE ENEM: 1.0000 | ENEMA | Freq: Once | RECTAL | Status: DC | PRN

## 2018-09-06 MED ORDER — CELECOXIB 200 MG PO CAPS: 200.0000 mg | ORAL_CAPSULE | Freq: Every day | ORAL | Status: DC | PRN

## 2018-09-06 MED ORDER — CEFAZOLIN SODIUM-DEXTROSE 1-4 GM/50ML-% IV SOLN
1.0000 g | Freq: Four times a day (QID) | INTRAVENOUS | Status: AC
  Administered 2018-09-06 – 2018-09-07 (×2): 1 g via INTRAVENOUS
  Filled 2018-09-06 (×2): qty 50

## 2018-09-06 MED ORDER — MIDAZOLAM HCL 2 MG/2ML IJ SOLN
INTRAMUSCULAR | Status: AC
  Filled 2018-09-06: qty 2

## 2018-09-06 MED ORDER — LIDOCAINE 2% (20 MG/ML) 5 ML SYRINGE
INTRAMUSCULAR | Status: AC
  Filled 2018-09-06: qty 5

## 2018-09-06 MED ORDER — CHLORHEXIDINE GLUCONATE 4 % EX LIQD: 60.0000 mL | Freq: Once | CUTANEOUS | Status: DC

## 2018-09-06 MED ORDER — PROPOFOL 500 MG/50ML IV EMUL
INTRAVENOUS | Status: DC | PRN
  Administered 2018-09-06: 75 ug/kg/min via INTRAVENOUS

## 2018-09-06 MED ORDER — DOCUSATE SODIUM 100 MG PO CAPS
100.0000 mg | ORAL_CAPSULE | Freq: Two times a day (BID) | ORAL | Status: DC
  Administered 2018-09-06 – 2018-09-08 (×4): 100 mg via ORAL
  Filled 2018-09-06 (×4): qty 1

## 2018-09-06 MED ORDER — APIXABAN 2.5 MG PO TABS: 2.5000 mg | ORAL_TABLET | Freq: Two times a day (BID) | ORAL | 0 refills | Status: DC

## 2018-09-06 MED ORDER — SODIUM CHLORIDE 0.9 % IV SOLN
INTRAVENOUS | Status: DC
  Administered 2018-09-06 – 2018-09-08 (×3): via INTRAVENOUS

## 2018-09-06 MED ORDER — BUPIVACAINE-EPINEPHRINE 0.25% -1:200000 IJ SOLN
INTRAMUSCULAR | Status: DC | PRN
  Administered 2018-09-06: 20 mL

## 2018-09-06 MED ORDER — SODIUM CHLORIDE (PF) 0.9 % IJ SOLN
INTRAMUSCULAR | Status: AC
  Filled 2018-09-06: qty 20

## 2018-09-06 MED ORDER — METOCLOPRAMIDE HCL 5 MG PO TABS: 5.0000 mg | ORAL_TABLET | Freq: Three times a day (TID) | ORAL | Status: DC | PRN

## 2018-09-06 MED ORDER — ATORVASTATIN CALCIUM 20 MG PO TABS
20.0000 mg | ORAL_TABLET | Freq: Every day | ORAL | Status: DC
  Administered 2018-09-07 – 2018-09-08 (×2): 20 mg via ORAL
  Filled 2018-09-06 (×2): qty 1

## 2018-09-06 MED ORDER — FENOFIBRATE 160 MG PO TABS
160.0000 mg | ORAL_TABLET | Freq: Every day | ORAL | Status: DC
  Administered 2018-09-07 – 2018-09-08 (×2): 160 mg via ORAL
  Filled 2018-09-06 (×2): qty 1

## 2018-09-06 MED ORDER — VENLAFAXINE HCL ER 37.5 MG PO CP24
37.5000 mg | ORAL_CAPSULE | Freq: Every day | ORAL | Status: DC
  Administered 2018-09-07 – 2018-09-08 (×2): 37.5 mg via ORAL
  Filled 2018-09-06 (×2): qty 1

## 2018-09-06 MED ORDER — HYPROMELLOSE (GONIOSCOPIC) 2.5 % OP SOLN: 1.0000 [drp] | Freq: Two times a day (BID) | OPHTHALMIC | Status: DC | PRN

## 2018-09-06 MED ORDER — WATER FOR IRRIGATION, STERILE IR SOLN
Status: DC | PRN
  Administered 2018-09-06: 1000 mL

## 2018-09-06 MED ORDER — TRANEXAMIC ACID 1000 MG/10ML IV SOLN
INTRAVENOUS | Status: DC | PRN
  Administered 2018-09-06: 16:00:00 2000 mg via TOPICAL

## 2018-09-06 MED ORDER — ACETAMINOPHEN 500 MG PO TABS: 1000.0000 mg | ORAL_TABLET | Freq: Once | ORAL | Status: DC

## 2018-09-06 MED ORDER — PHENYLEPHRINE 40 MCG/ML (10ML) SYRINGE FOR IV PUSH (FOR BLOOD PRESSURE SUPPORT)
PREFILLED_SYRINGE | INTRAVENOUS | Status: AC
  Filled 2018-09-06: qty 10

## 2018-09-06 MED ORDER — SODIUM CHLORIDE (PF) 0.9 % IJ SOLN
INTRAMUSCULAR | Status: DC | PRN
  Administered 2018-09-06: 20 mL

## 2018-09-06 MED ORDER — METOCLOPRAMIDE HCL 5 MG/ML IJ SOLN: 5.0000 mg | Freq: Three times a day (TID) | INTRAMUSCULAR | Status: DC | PRN

## 2018-09-06 MED ORDER — CEFAZOLIN SODIUM-DEXTROSE 2-4 GM/100ML-% IV SOLN
2.0000 g | INTRAVENOUS | Status: AC
  Administered 2018-09-06: 14:00:00 2 g via INTRAVENOUS
  Filled 2018-09-06: qty 100

## 2018-09-06 MED ORDER — APIXABAN 2.5 MG PO TABS
2.5000 mg | ORAL_TABLET | Freq: Two times a day (BID) | ORAL | Status: DC
  Administered 2018-09-07 – 2018-09-08 (×3): 2.5 mg via ORAL
  Filled 2018-09-06 (×3): qty 1

## 2018-09-06 MED ORDER — ALENDRONATE SODIUM 70 MG PO TABS: 70.0000 mg | ORAL_TABLET | ORAL | Status: DC

## 2018-09-06 MED ORDER — BUPIVACAINE-EPINEPHRINE (PF) 0.25% -1:200000 IJ SOLN
INTRAMUSCULAR | Status: AC
  Filled 2018-09-06: qty 30

## 2018-09-06 MED ORDER — PHENOL 1.4 % MT LIQD: 1.0000 | OROMUCOSAL | Status: DC | PRN

## 2018-09-06 MED ORDER — ONDANSETRON HCL 4 MG/2ML IJ SOLN
4.0000 mg | Freq: Four times a day (QID) | INTRAMUSCULAR | Status: DC | PRN
  Administered 2018-09-06: 4 mg via INTRAVENOUS
  Filled 2018-09-06 (×2): qty 2

## 2018-09-06 MED ORDER — MENTHOL 3 MG MT LOZG: 1.0000 | LOZENGE | OROMUCOSAL | Status: DC | PRN

## 2018-09-06 MED ORDER — LACTATED RINGERS IV SOLN
INTRAVENOUS | Status: DC
  Administered 2018-09-06 (×2): via INTRAVENOUS

## 2018-09-06 MED ORDER — SENNOSIDES-DOCUSATE SODIUM 8.6-50 MG PO TABS: 1.0000 | ORAL_TABLET | Freq: Every evening | ORAL | Status: DC | PRN

## 2018-09-06 MED ORDER — TRAMADOL HCL 50 MG PO TABS
50.0000 mg | ORAL_TABLET | Freq: Four times a day (QID) | ORAL | Status: DC | PRN
  Administered 2018-09-07 (×2): 50 mg via ORAL
  Filled 2018-09-06 (×2): qty 1

## 2018-09-06 MED ORDER — LIDOCAINE 2% (20 MG/ML) 5 ML SYRINGE
INTRAMUSCULAR | Status: DC | PRN
  Administered 2018-09-06: 40 mg via INTRAVENOUS

## 2018-09-06 MED ORDER — TRANEXAMIC ACID-NACL 1000-0.7 MG/100ML-% IV SOLN
1000.0000 mg | INTRAVENOUS | Status: AC
  Administered 2018-09-06: 14:00:00 1000 mg via INTRAVENOUS
  Filled 2018-09-06: qty 100

## 2018-09-06 MED ORDER — EZETIMIBE 10 MG PO TABS
10.0000 mg | ORAL_TABLET | Freq: Every day | ORAL | Status: DC
  Administered 2018-09-07 – 2018-09-08 (×2): 10 mg via ORAL
  Filled 2018-09-06 (×2): qty 1

## 2018-09-06 MED ORDER — HYDROMORPHONE HCL 2 MG/ML IJ SOLN
INTRAMUSCULAR | Status: AC
  Filled 2018-09-06: qty 1

## 2018-09-06 MED ORDER — SODIUM CHLORIDE 0.9 % IR SOLN
Status: DC | PRN
  Administered 2018-09-06: 1

## 2018-09-06 MED ORDER — TRANEXAMIC ACID-NACL 1000-0.7 MG/100ML-% IV SOLN
1000.0000 mg | Freq: Once | INTRAVENOUS | Status: AC
  Administered 2018-09-06: 1000 mg via INTRAVENOUS
  Filled 2018-09-06: qty 100

## 2018-09-06 MED ORDER — VITAMIN B-12 1000 MCG PO TABS
1000.0000 ug | ORAL_TABLET | Freq: Every day | ORAL | Status: DC
  Administered 2018-09-07 – 2018-09-08 (×2): 1000 ug via ORAL
  Filled 2018-09-06: qty 1

## 2018-09-06 MED ORDER — FENTANYL CITRATE (PF) 100 MCG/2ML IJ SOLN
INTRAMUSCULAR | Status: AC
  Administered 2018-09-06: 25 ug via INTRAVENOUS
  Filled 2018-09-06: qty 2

## 2018-09-06 MED ORDER — SODIUM CHLORIDE 0.9 % IV SOLN
INTRAVENOUS | Status: DC | PRN
  Administered 2018-09-06: 50 ug/min via INTRAVENOUS

## 2018-09-06 MED ORDER — ACETAMINOPHEN 325 MG PO TABS: 325.0000 mg | ORAL_TABLET | Freq: Four times a day (QID) | ORAL | Status: DC | PRN

## 2018-09-06 MED ORDER — FENTANYL CITRATE (PF) 100 MCG/2ML IJ SOLN
25.0000 ug | INTRAMUSCULAR | Status: DC | PRN
  Administered 2018-09-06 (×2): 25 ug via INTRAVENOUS

## 2018-09-06 MED ORDER — SODIUM CHLORIDE 0.9 % IV SOLN: INTRAVENOUS | Status: DC

## 2018-09-06 MED ORDER — ACETAMINOPHEN 500 MG PO TABS
1000.0000 mg | ORAL_TABLET | Freq: Four times a day (QID) | ORAL | Status: AC
  Administered 2018-09-06 – 2018-09-07 (×3): 1000 mg via ORAL
  Filled 2018-09-06 (×3): qty 2

## 2018-09-06 SURGICAL SUPPLY — 60 items
BAG DECANTER FOR FLEXI CONT (MISCELLANEOUS) ×3 IMPLANT
BAG ZIPLOCK 12X15 (MISCELLANEOUS) ×3 IMPLANT
BENZOIN TINCTURE PRP APPL 2/3 (GAUZE/BANDAGES/DRESSINGS) ×2 IMPLANT
BLADE SAW SAG 73X25 THK (BLADE) ×1
BLADE SAW SGTL 73X25 THK (BLADE) ×2 IMPLANT
CLOSURE STERI-STRIP 1/2X4 (GAUZE/BANDAGES/DRESSINGS) ×1
CLOSURE WOUND 1/2 X4 (GAUZE/BANDAGES/DRESSINGS) ×1
CLSR STERI-STRIP ANTIMIC 1/2X4 (GAUZE/BANDAGES/DRESSINGS) ×2 IMPLANT
COVER SURGICAL LIGHT HANDLE (MISCELLANEOUS) ×3 IMPLANT
COVER WAND RF STERILE (DRAPES) IMPLANT
DECANTER SPIKE VIAL GLASS SM (MISCELLANEOUS) ×9 IMPLANT
DRAPE INCISE IOBAN 66X45 STRL (DRAPES) ×3 IMPLANT
DRAPE ORTHO SPLIT 77X108 STRL (DRAPES) ×4
DRAPE POUCH INSTRU U-SHP 10X18 (DRAPES) ×3 IMPLANT
DRAPE SURG ORHT 6 SPLT 77X108 (DRAPES) ×2 IMPLANT
DRAPE U-SHAPE 47X51 STRL (DRAPES) ×3 IMPLANT
DRESSING AQUACEL AG SP 3.5X10 (GAUZE/BANDAGES/DRESSINGS) ×1 IMPLANT
DRSG AQUACEL AG SP 3.5X10 (GAUZE/BANDAGES/DRESSINGS) ×3
DRSG EMULSION OIL 3X16 NADH (GAUZE/BANDAGES/DRESSINGS) ×3 IMPLANT
DRSG PAD ABDOMINAL 8X10 ST (GAUZE/BANDAGES/DRESSINGS) ×6 IMPLANT
ELECT BLADE TIP CTD 4 INCH (ELECTRODE) ×3 IMPLANT
ELECT REM PT RETURN 15FT ADLT (MISCELLANEOUS) ×3 IMPLANT
FACESHIELD WRAPAROUND (MASK) ×9 IMPLANT
FACESHIELD WRAPAROUND OR TEAM (MASK) ×3 IMPLANT
GAUZE SPONGE 4X4 12PLY STRL (GAUZE/BANDAGES/DRESSINGS) ×3 IMPLANT
GLOVE BIOGEL PI IND STRL 8 (GLOVE) ×2 IMPLANT
GLOVE BIOGEL PI INDICATOR 8 (GLOVE) ×4
GLOVE SURG ORTHO 8.0 STRL STRW (GLOVE) ×3 IMPLANT
GLOVE SURG SS PI 7.5 STRL IVOR (GLOVE) ×3 IMPLANT
GOWN STRL REUS W/TWL XL LVL3 (GOWN DISPOSABLE) ×6 IMPLANT
HEAD CERAMIC DELTA 36 PLUS 1.5 (Hips) ×2 IMPLANT
HOLDER FOLEY CATH W/STRAP (MISCELLANEOUS) ×3 IMPLANT
HOOD PEEL AWAY FLYTE STAYCOOL (MISCELLANEOUS) ×3 IMPLANT
IMMOBILIZER KNEE 20 (SOFTGOODS) ×3
IMMOBILIZER KNEE 20 THIGH 36 (SOFTGOODS) ×1 IMPLANT
KIT BASIN OR (CUSTOM PROCEDURE TRAY) ×3 IMPLANT
KIT TURNOVER KIT A (KITS) IMPLANT
LINER NEUTRAL 52X36X52 PLUS 4 (Liner) ×2 IMPLANT
MANIFOLD NEPTUNE II (INSTRUMENTS) ×3 IMPLANT
NEEDLE HYPO 22GX1.5 SAFETY (NEEDLE) ×6 IMPLANT
NS IRRIG 1000ML POUR BTL (IV SOLUTION) ×3 IMPLANT
PACK TOTAL JOINT (CUSTOM PROCEDURE TRAY) ×3 IMPLANT
PIN SECTOR W/GRIP ACE CUP 52MM (Hips) ×2 IMPLANT
PROTECTOR NERVE ULNAR (MISCELLANEOUS) ×3 IMPLANT
STAPLER VISISTAT 35W (STAPLE) IMPLANT
STEM FEM CMNTLSS SM AML 13.5 (Hips) ×2 IMPLANT
STRIP CLOSURE SKIN 1/2X4 (GAUZE/BANDAGES/DRESSINGS) ×1 IMPLANT
SUCTION FRAZIER HANDLE 12FR (TUBING) ×2
SUCTION TUBE FRAZIER 12FR DISP (TUBING) ×1 IMPLANT
SUT ETHIBOND NAB CT1 #1 30IN (SUTURE) ×12 IMPLANT
SUT MNCRL 3 0 RB1 (SUTURE) ×1 IMPLANT
SUT MONOCRYL 3 0 RB1 (SUTURE) ×2
SUT VIC AB 0 CT1 36 (SUTURE) ×6 IMPLANT
SUT VIC AB 2-0 CT1 27 (SUTURE) ×4
SUT VIC AB 2-0 CT1 TAPERPNT 27 (SUTURE) ×2 IMPLANT
SYR CONTROL 10ML LL (SYRINGE) ×6 IMPLANT
TOWEL OR 17X26 10 PK STRL BLUE (TOWEL DISPOSABLE) ×3 IMPLANT
TRAY FOLEY MTR SLVR 16FR STAT (SET/KITS/TRAYS/PACK) ×3 IMPLANT
WATER STERILE IRR 1000ML POUR (IV SOLUTION) ×6 IMPLANT
YANKAUER SUCT BULB TIP 10FT TU (MISCELLANEOUS) ×3 IMPLANT

## 2018-09-06 NOTE — Transfer of Care (Signed)
Immediate Anesthesia Transfer of Care Note  Patient: Beverly [name]  Procedure(s) Performed: TOTAL HIP ARTHROPLASTY (Left Hip)  Patient Location: PACU  Anesthesia Type:Spinal  Level of Consciousness: awake, alert , oriented and patient cooperative  Airway & Oxygen Therapy: Patient Spontanous Breathing and Patient connected to face mask oxygen  Post-op Assessment: Report given to RN, Post -op Vital signs reviewed and stable and Patient moving all extremities  Post vital signs: Reviewed and stable  Last Vitals:  Vitals Value Taken Time  BP 108/65 09/06/18 1608  Temp    Pulse    Resp 8 09/06/18 1609  SpO2    Vitals shown include unvalidated device data.  Last Pain:  Vitals:   09/06/18 1034  TempSrc: Oral  PainSc: 8          Complications: No apparent anesthesia complications

## 2018-09-06 NOTE — Interval H&P Note (Signed)
History and Physical Interval Note:  09/06/2018 12:30 PM  Beverly [name]  has presented today for surgery, with the diagnosis of OA LEFT HIP.  The various methods of treatment have been discussed with the patient and family. After consideration of risks, benefits and other options for treatment, the patient has consented to  Procedure(s): TOTAL HIP ARTHROPLASTY (Left) as a surgical intervention.  The patient's history has been reviewed, patient examined, no change in status, stable for surgery.  I have reviewed the patient's chart and labs.  Questions were answered to the patient's satisfaction.     Yvette Rack

## 2018-09-06 NOTE — Anesthesia Postprocedure Evaluation (Signed)
Anesthesia Post Note  Patient: Beverly [name]  Procedure(s) Performed: TOTAL HIP ARTHROPLASTY (Left Hip)     Patient location during evaluation: PACU Anesthesia Type: Spinal Level of consciousness: awake and alert Pain management: pain level controlled Vital Signs Assessment: post-procedure vital signs reviewed and stable Respiratory status: spontaneous breathing, nonlabored ventilation, respiratory function stable and patient connected to nasal cannula oxygen Cardiovascular status: blood pressure returned to baseline and stable Postop Assessment: no apparent nausea or vomiting, no headache and spinal receding Anesthetic complications: no    Last Vitals:  Vitals:   09/06/18 1630 09/06/18 1645  BP: 139/75 (!) 136/92  Pulse: 67 69  Resp: 11 10  Temp:    SpO2: 100% 100%    Last Pain:  Vitals:   09/06/18 1630  TempSrc:   PainSc: Asleep                 Beverly [name]

## 2018-09-06 NOTE — Anesthesia Procedure Notes (Signed)
Spinal  Patient location during procedure: OR Start time: 09/06/2018 2:02 PM End time: 09/06/2018 2:06 PM Staffing Anesthesiologist: Freddrick March, MD Resident/CRNA: Raenette Rover, CRNA Performed: resident/CRNA  Preanesthetic Checklist Completed: patient identified, site marked, surgical consent, pre-op evaluation, timeout performed, IV checked, risks and benefits discussed and monitors and equipment checked Spinal Block Patient position: sitting Prep: DuraPrep Patient monitoring: heart rate, continuous pulse ox and blood pressure Approach: midline Location: L3-4 Injection technique: single-shot Needle Needle type: Pencan  Needle gauge: 25 G

## 2018-09-06 NOTE — Op Note (Signed)
NAME: Beverly, [name] MEDICAL RECORD ZO:[id number] ACCOUNT [id number] DATE OF BIRTH:1950/08/24 FACILITY: [facility] LOCATION: [facility] PHYSICIAN:W. Shanasia Ibrahim JR., MD  OPERATIVE REPORT  DATE OF PROCEDURE:  09/06/2018  PREOPERATIVE DIAGNOSIS:  Severe inflammatory arthritis, left hip.  OPERATION:  Left total hip replacement (AML small stature 13.5 mm stem with 36 mm Biolox ceramic femoral head 1.5 mm neck length, Pinnacle Gription acetabular shell with a 52 mm outside diameter with Pinnacle Altrx acetabular lining with +4 mm 10 degree  liner.  SURGEON:  Vangie Bicker, MD  ASSISTANTMarjo Bicker, PA.  ANESTHESIA:  Spinal.  ESTIMATED BLOOD LOSS:  300.  DESCRIPTION OF PROCEDURE:  Lateral positioning posterior approach to the hip was made, splitting the iliotibial band and gluteus maximus.  We did a T capsulotomy in the hip, split the short external rotators.  Prior to this delivered the diseased femoral  head, which was completely denuded of articular cartilage.  There was a moderate amount of hypertrophic reddened synovium more consistent with an inflammatory process.  We did send that for biopsy.  We cut the femoral head one fingerbreadth above the  lesser trochanter followed by progressive reaming and rasping to accept the 13.5 mm small stature trial.  Attention was next directed to the acetabulum.  Again, a large amount of soft tissue synovial tissue was removed.  We placed acetabular wing retractors superiorly and posteriorly.  We progressively reamed for 1 mm under reaming to accept a 52 mm cup.   Position of 45 degrees of abduction, 15-20 degrees of anteversion.  We then trialed using the femoral broach and the trial liner on the left acetabulum after placing the final acetabular cup.  Excellent stability was noted with full flexion, adduction  and internal rotation approaching 90 degrees before any tendency was noted to dislocate.  Leg lengths felt equal.  Final components were  inserted after the final poly was inserted with the femoral stem and then we tried a trial off the femoral stem and  accepting the 1.5 mm neck length.  Wound was copiously irrigated.  The T capsulotomy was closed with interrupted Ethibond and then a running Ethibond on the gluteus maximus, subcutaneous tissues with Vicryl and the skin with Monocryl.  A lightly  compressive sterile dressing and knee immobilizer applied, taken to recovery room in stable condition.  TN/NUANCE  D:09/06/2018 T:09/06/2018 JOB:[id number]

## 2018-09-06 NOTE — Brief Op Note (Signed)
09/06/2018  4:01 PM  PATIENT:  Beverly [name]  68 y.o. female  PRE-OPERATIVE DIAGNOSIS:  OA LEFT HIP  POST-OPERATIVE DIAGNOSIS:  OA LEFT HIP  PROCEDURE:  Procedure(s): TOTAL HIP ARTHROPLASTY (Left)  SURGEON:  Surgeon(s) and Role:    Earlie Server, MD - Primary  PHYSICIAN ASSISTANT: Chriss Czar, PA-C  ASSISTANTS: OR staff x1   ANESTHESIA:   local, spinal and IV sedation  EBL:  300 mL   BLOOD ADMINISTERED:none  DRAINS: none   LOCAL MEDICATIONS USED:  MARCAINE     SPECIMEN:  Source of Specimen:  left hip synovium  DISPOSITION OF SPECIMEN:  PATHOLOGY  COUNTS:  YES  TOURNIQUET:  * No tourniquets in log *  DICTATION: .Other Dictation: Dictation Number unknown  PLAN OF CARE: Admit for overnight observation  PATIENT DISPOSITION:  PACU - hemodynamically stable.   Delay start of Pharmacological VTE agent (>24hrs) due to surgical blood loss or risk of bleeding: yes

## 2018-09-06 NOTE — Progress Notes (Signed)
Pt transported to room with 2 bags and cane.  Spouse has room number.

## 2018-09-06 NOTE — Discharge Instructions (Signed)
INSTRUCTIONS AFTER JOINT REPLACEMENT  ° °o Remove items at home which could result in a fall. This includes throw rugs or furniture in walking pathways °o ICE to the affected joint every three hours while awake for 30 minutes at a time, for at least the first 3-5 days, and then as needed for pain and swelling.  Continue to use ice for pain and swelling. You may notice swelling that will progress down to the foot and ankle.  This is normal after surgery.  Elevate your leg when you are not up walking on it.   °o Continue to use the breathing machine you got in the hospital (incentive spirometer) which will help keep your temperature down.  It is common for your temperature to cycle up and down following surgery, especially at night when you are not up moving around and exerting yourself.  The breathing machine keeps your lungs expanded and your temperature down. ° ° °DIET:  As you were doing prior to hospitalization, we recommend a well-balanced diet. ° °DRESSING / WOUND CARE / SHOWERING ° °Keep the surgical dressing until follow up.  The dressing is water proof, so you can shower without any extra covering.  IF THE DRESSING FALLS OFF or the wound gets wet inside, change the dressing with sterile gauze.  Please use good hand washing techniques before changing the dressing.  Do not use any lotions or creams on the incision until instructed by your surgeon.   ° °ACTIVITY ° °o Increase activity slowly as tolerated, but follow the weight bearing instructions below.   °o No driving for 6 weeks or until further direction given by your physician.  You cannot drive while taking narcotics.  °o No lifting or carrying greater than 10 lbs. until further directed by your surgeon. °o Avoid periods of inactivity such as sitting longer than an hour when not asleep. This helps prevent blood clots.  °o You may return to work once you are authorized by your doctor.  ° ° ° °WEIGHT BEARING  ° °Weight bearing as tolerated with assist  device (walker, cane, etc) as directed, use it as long as suggested by your surgeon or therapist, typically at least 4-6 weeks. ° ° °EXERCISES ° °Results after joint replacement surgery are often greatly improved when you follow the exercise, range of motion and muscle strengthening exercises prescribed by your doctor. Safety measures are also important to protect the joint from further injury. Any time any of these exercises cause you to have increased pain or swelling, decrease what you are doing until you are comfortable again and then slowly increase them. If you have problems or questions, call your caregiver or physical therapist for advice.  ° °Rehabilitation is important following a joint replacement. After just a few days of immobilization, the muscles of the leg can become weakened and shrink (atrophy).  These exercises are designed to build up the tone and strength of the thigh and leg muscles and to improve motion. Often times heat used for twenty to thirty minutes before working out will loosen up your tissues and help with improving the range of motion but do not use heat for the first two weeks following surgery (sometimes heat can increase post-operative swelling).  ° °These exercises can be done on a training (exercise) mat, on the floor, on a table or on a bed. Use whatever works the best and is most comfortable for you.    Use music or television while you are exercising so that   the exercises are a pleasant break in your day. This will make your life better with the exercises acting as a break in your routine that you can look forward to.   Perform all exercises about fifteen times, three times per day or as directed.  You should exercise both the operative leg and the other leg as well. ° °Exercises include: °  °• Quad Sets - Tighten up the muscle on the front of the thigh (Quad) and hold for 5-10 seconds.   °• Straight Leg Raises - With your knee straight (if you were given a brace, keep it on),  lift the leg to 60 degrees, hold for 3 seconds, and slowly lower the leg.  Perform this exercise against resistance later as your leg gets stronger.  °• Leg Slides: Lying on your back, slowly slide your foot toward your buttocks, bending your knee up off the floor (only go as far as is comfortable). Then slowly slide your foot back down until your leg is flat on the floor again.  °• Angel Wings: Lying on your back spread your legs to the side as far apart as you can without causing discomfort.  °• Hamstring Strength:  Lying on your back, push your heel against the floor with your leg straight by tightening up the muscles of your buttocks.  Repeat, but this time bend your knee to a comfortable angle, and push your heel against the floor.  You may put a pillow under the heel to make it more comfortable if necessary.  ° °A rehabilitation program following joint replacement surgery can speed recovery and prevent re-injury in the future due to weakened muscles. Contact your doctor or a physical therapist for more information on knee rehabilitation.  ° ° °CONSTIPATION ° °Constipation is defined medically as fewer than three stools per week and severe constipation as less than one stool per week.  Even if you have a regular bowel pattern at home, your normal regimen is likely to be disrupted due to multiple reasons following surgery.  Combination of anesthesia, postoperative narcotics, change in appetite and fluid intake all can affect your bowels.  ° °YOU MUST use at least one of the following options; they are listed in order of increasing strength to get the job done.  They are all available over the counter, and you may need to use some, POSSIBLY even all of these options:   ° °Drink plenty of fluids (prune juice may be helpful) and high fiber foods °Colace 100 mg by mouth twice a day  °Senokot for constipation as directed and as needed Dulcolax (bisacodyl), take with full glass of water  °Miralax (polyethylene glycol)  once or twice a day as needed. ° °If you have tried all these things and are unable to have a bowel movement in the first 3-4 days after surgery call either your surgeon or your primary doctor.   ° °If you experience loose stools or diarrhea, hold the medications until you stool forms back up.  If your symptoms do not get better within 1 week or if they get worse, check with your doctor.  If you experience "the worst abdominal pain ever" or develop nausea or vomiting, please contact the office immediately for further recommendations for treatment. ° ° °ITCHING:  If you experience itching with your medications, try taking only a single pain pill, or even half a pain pill at a time.  You can also use Benadryl over the counter for itching or also to   help with sleep.   TED HOSE STOCKINGS:  Use stockings on both legs until for at least 2 weeks or as directed by physician office. They may be removed at night for sleeping.  MEDICATIONS:  See your medication summary on the After Visit Summary that nursing will review with you.  You may have some home medications which will be placed on hold until you complete the course of blood thinner medication.  It is important for you to complete the blood thinner medication as prescribed.  PRECAUTIONS:  If you experience chest pain or shortness of breath - call 911 immediately for transfer to the hospital emergency department.   If you develop a fever greater that 101 F, purulent drainage from wound, increased redness or drainage from wound, foul odor from the wound/dressing, or calf pain - CONTACT YOUR SURGEON.                                                   FOLLOW-UP APPOINTMENTS:  If you do not already have a post-op appointment, please call the office for an appointment to be seen by your surgeon.  Guidelines for how soon to be seen are listed in your After Visit Summary, but are typically between 1-4 weeks after surgery.  OTHER INSTRUCTIONS:   Knee  Replacement:  Do not place pillow under knee, focus on keeping the knee straight while resting. CPM instructions: 0-90 degrees, 2 hours in the morning, 2 hours in the afternoon, and 2 hours in the evening. Place foam block, curve side up under heel at all times except when in CPM or when walking.  DO NOT modify, tear, cut, or change the foam block in any way.  MAKE SURE YOU:   Understand these instructions.   Get help right away if you are not doing well or get worse.    Thank you for letting us be a part of your medical care team.  It is a privilege we respect greatly.  We hope these instructions will help you stay on track for a fast and full recovery!    Information on my medicine - ELIQUIS (apixaban)  This medication education was reviewed with me or my healthcare representative as part of my discharge preparation.  The pharmacist that spoke with me during my hospital stay was:    Why was Eliquis prescribed for you? Eliquis was prescribed for you to reduce the risk of blood clots forming after orthopedic surgery.    What do You need to know about Eliquis? Take your Eliquis TWICE DAILY - one tablet in the morning and one tablet in the evening with or without food.  It would be best to take the dose about the same time each day.  If you have difficulty swallowing the tablet whole please discuss with your pharmacist how to take the medication safely.  Take Eliquis exactly as prescribed by your doctor and DO NOT stop taking Eliquis without talking to the doctor who prescribed the medication.  Stopping without other medication to take the place of Eliquis may increase your risk of developing a clot.  After discharge, you should have regular check-up appointments with your healthcare provider that is prescribing your Eliquis.  What do you do if you miss a dose? If a dose of ELIQUIS is not taken at the scheduled time, take it as  soon as possible on the same day and twice-daily  administration should be resumed.  The dose should not be doubled to make up for a missed dose.  Do not take more than one tablet of ELIQUIS at the same time.  Important Safety Information A possible side effect of Eliquis is bleeding. You should call your healthcare provider right away if you experience any of the following: ? Bleeding from an injury or your nose that does not stop. ? Unusual colored urine (red or dark brown) or unusual colored stools (red or black). ? Unusual bruising for unknown reasons. ? A serious fall or if you hit your head (even if there is no bleeding).  Some medicines may interact with Eliquis and might increase your risk of bleeding or clotting while on Eliquis. To help avoid this, consult your healthcare provider or pharmacist prior to using any new prescription or non-prescription medications, including herbals, vitamins, non-steroidal anti-inflammatory drugs (NSAIDs) and supplements.  This website has more information on Eliquis (apixaban): http://www.eliquis.com/eliquis/home

## 2018-09-07 ENCOUNTER — Inpatient Hospital Stay (HOSPITAL_COMMUNITY): Payer: PPO

## 2018-09-07 DIAGNOSIS — K219 Gastro-esophageal reflux disease without esophagitis: Secondary | ICD-10-CM | POA: Diagnosis present

## 2018-09-07 DIAGNOSIS — M1612 Unilateral primary osteoarthritis, left hip: Secondary | ICD-10-CM | POA: Diagnosis present

## 2018-09-07 DIAGNOSIS — F17210 Nicotine dependence, cigarettes, uncomplicated: Secondary | ICD-10-CM | POA: Diagnosis present

## 2018-09-07 DIAGNOSIS — I10 Essential (primary) hypertension: Secondary | ICD-10-CM | POA: Diagnosis present

## 2018-09-07 DIAGNOSIS — Z7951 Long term (current) use of inhaled steroids: Secondary | ICD-10-CM | POA: Diagnosis not present

## 2018-09-07 DIAGNOSIS — Z79899 Other long term (current) drug therapy: Secondary | ICD-10-CM | POA: Diagnosis not present

## 2018-09-07 DIAGNOSIS — M62838 Other muscle spasm: Secondary | ICD-10-CM | POA: Diagnosis present

## 2018-09-07 DIAGNOSIS — E785 Hyperlipidemia, unspecified: Secondary | ICD-10-CM | POA: Diagnosis present

## 2018-09-07 DIAGNOSIS — Z85828 Personal history of other malignant neoplasm of skin: Secondary | ICD-10-CM | POA: Diagnosis not present

## 2018-09-07 DIAGNOSIS — Z7983 Long term (current) use of bisphosphonates: Secondary | ICD-10-CM | POA: Diagnosis not present

## 2018-09-07 DIAGNOSIS — Z96649 Presence of unspecified artificial hip joint: Secondary | ICD-10-CM

## 2018-09-07 LAB — BASIC METABOLIC PANEL
Anion gap: 10 (ref 5–15)
BUN: 10 mg/dL (ref 8–23)
CO2: 24 mmol/L (ref 22–32)
Calcium: 8.4 mg/dL — ABNORMAL LOW (ref 8.9–10.3)
Chloride: 101 mmol/L (ref 98–111)
Creatinine, Ser: 0.56 mg/dL (ref 0.44–1.00)
GFR calc Af Amer: >60 mL/min (ref >60)
GFR calc non Af Amer: >60 mL/min (ref >60)
Glucose, Bld: 113 mg/dL — ABNORMAL HIGH (ref 70–99)
Potassium: 4.1 mmol/L (ref 3.5–5.1)
Sodium: 135 mmol/L (ref 135–145)

## 2018-09-07 LAB — CBC
HCT: 32.1 % — ABNORMAL LOW (ref 36.0–46.0)
Hemoglobin: 10.7 g/dL — ABNORMAL LOW (ref 12.0–15.0)
MCH: 33 pg (ref 26.0–34.0)
MCHC: 33.3 g/dL (ref 30.0–36.0)
MCV: 99.1 fL (ref 80.0–100.0)
Platelets: 328 10*3/uL (ref 150–400)
RBC: 3.24 MIL/uL — ABNORMAL LOW (ref 3.87–5.11)
RDW: 11.9 % (ref 11.5–15.5)
WBC: 7.9 10*3/uL (ref 4.0–10.5)
nRBC: 0 % (ref 0.0–0.2)

## 2018-09-07 MED ORDER — BACLOFEN 10 MG PO TABS: 10.0000 mg | ORAL_TABLET | Freq: Three times a day (TID) | ORAL | 0 refills | Status: DC | PRN

## 2018-09-07 MED ORDER — GABAPENTIN 100 MG PO CAPS
100.0000 mg | ORAL_CAPSULE | Freq: Three times a day (TID) | ORAL | Status: DC
  Administered 2018-09-07 – 2018-09-08 (×5): 100 mg via ORAL
  Filled 2018-09-07 (×5): qty 1

## 2018-09-07 MED ORDER — LORAZEPAM 1 MG PO TABS: 1.0000 mg | ORAL_TABLET | Freq: Four times a day (QID) | ORAL | Status: DC | PRN

## 2018-09-07 MED ORDER — FOLIC ACID 1 MG PO TABS
1.0000 mg | ORAL_TABLET | Freq: Every day | ORAL | Status: DC
  Administered 2018-09-07 – 2018-09-08 (×2): 1 mg via ORAL
  Filled 2018-09-07 (×2): qty 1

## 2018-09-07 MED ORDER — THIAMINE HCL 100 MG/ML IJ SOLN
100.0000 mg | Freq: Every day | INTRAMUSCULAR | Status: DC
  Filled 2018-09-07 (×2): qty 1

## 2018-09-07 MED ORDER — TRAMADOL HCL 50 MG PO TABS: 50.0000 mg | ORAL_TABLET | Freq: Four times a day (QID) | ORAL | Status: DC | PRN

## 2018-09-07 MED ORDER — BACLOFEN 10 MG PO TABS
10.0000 mg | ORAL_TABLET | Freq: Three times a day (TID) | ORAL | Status: DC | PRN
  Administered 2018-09-07 – 2018-09-08 (×3): 10 mg via ORAL
  Filled 2018-09-07 (×3): qty 1

## 2018-09-07 MED ORDER — LIP MEDEX EX OINT
TOPICAL_OINTMENT | CUTANEOUS | Status: AC
  Filled 2018-09-07: qty 7

## 2018-09-07 MED ORDER — ADULT MULTIVITAMIN W/MINERALS CH
1.0000 | ORAL_TABLET | Freq: Every day | ORAL | Status: DC
  Administered 2018-09-07 – 2018-09-08 (×2): 1 via ORAL
  Filled 2018-09-07 (×2): qty 1

## 2018-09-07 MED ORDER — SODIUM CHLORIDE 0.9 % IV BOLUS
500.0000 mL | Freq: Once | INTRAVENOUS | Status: AC
  Administered 2018-09-07: 500 mL via INTRAVENOUS

## 2018-09-07 MED ORDER — BACLOFEN 10 MG PO TABS: 10.0000 mg | ORAL_TABLET | Freq: Three times a day (TID) | ORAL | Status: DC

## 2018-09-07 MED ORDER — LORAZEPAM 2 MG/ML IJ SOLN: 1.0000 mg | Freq: Four times a day (QID) | INTRAMUSCULAR | Status: DC | PRN

## 2018-09-07 MED ORDER — VITAMIN B-1 100 MG PO TABS
100.0000 mg | ORAL_TABLET | Freq: Every day | ORAL | Status: DC
  Administered 2018-09-07 – 2018-09-08 (×2): 100 mg via ORAL
  Filled 2018-09-07 (×2): qty 1

## 2018-09-07 NOTE — TOC Initial Note (Signed)
Transition of Care [facility]) - Initial/Assessment Note    Patient Details  Name: Beverly [name] MRN: [id number] Date of Birth: 12/12/1950  Transition of Care (TOC) CM/SW Contact:    Joaquin Courts, RN Phone Number: 09/07/2018, 10:43 AM  Clinical Narrative:   CM spoke with patient at bedside who reports she has a scheduled appointment for OPPT, has a rolling walker and needs 3-in-1. [employer] set-up to deliver 3-in-1 to bedside for home use.                  Expected Discharge Plan: Home/Self Care Barriers to Discharge: Continued Medical Work up   Patient Goals and CMS Choice Patient states their goals for this hospitalization and ongoing recovery are:: to go home      Expected Discharge Plan and Services Expected Discharge Plan: Home/Self Care   Discharge Planning Services: CM Consult Post Acute Care Choice: Durable Medical Equipment Living arrangements for the past 2 months: Single Family Home                 DME Arranged: 3-N-1 DME Agency: [employer] Date DME Agency Contacted: 09/07/18 Time DME Agency Contacted: [phone] Representative spoke with at DME Agency: [place]: NA [place] Agency: NA        Prior Living Arrangements/Services Living arrangements for the past 2 months: [place] with:: Spouse Patient language and need for interpreter reviewed:: Yes Do you feel safe going back to the place where you live?: Yes      Need for Family Participation in Patient Care: Yes (Comment) Care giver support system in place?: Yes (comment)   Criminal Activity/Legal Involvement Pertinent to Current Situation/Hospitalization: No - Comment as needed  Activities of Daily Living Home Assistive Devices/Equipment: Cane (specify quad or straight), Grab bars in shower, Raised toilet seat with rails, Contact lenses, Eyeglasses, Hand-held shower hose ADL Screening (condition at time of admission) Patient's cognitive ability adequate to safely complete daily  activities?: Yes Is the patient deaf or have difficulty hearing?: No Does the patient have difficulty seeing, even when wearing glasses/contacts?: No Does the patient have difficulty concentrating, remembering, or making decisions?: No Patient able to express need for assistance with ADLs?: Yes Does the patient have difficulty dressing or bathing?: No Independently performs ADLs?: Yes (appropriate for developmental age) Does the patient have difficulty walking or climbing stairs?: Yes Weakness of Legs: None Weakness of Arms/Hands: None  Permission Sought/Granted                  Emotional Assessment Appearance:: Appears stated age Attitude/Demeanor/Rapport: Engaged Affect (typically observed): Accepting Orientation: : Oriented to Self, Oriented to Place, Oriented to  Time, Oriented to Situation      Admission diagnosis:  OA LEFT HIP Patient Active Problem List   Diagnosis Date Noted  . S/P total hip arthroplasty 09/07/2018  . Degenerative joint disease of left hip 09/06/2018  . B12 deficiency 02/01/2018  . DDD (degenerative disc disease), lumbar 01/03/2018  . Hyponatremia 10/04/2017  . Thrombocytosis ([place]) 10/04/2017  . Chronic respiratory failure with hypoxia ([place]) 09/01/2017  . Dyslipidemia, goal LDL below 70 12/06/2016  . Aortic atherosclerosis ([place]) 12/06/2016  . Diverticulosis 04/11/2016  . Essential hypertension 10/03/2013  . Current smoker 10/03/2013  . Esophageal reflux 10/03/2013  . Anxiety disorder 10/03/2013  . Allergic rhinitis 10/03/2013  . Insomnia 07/17/2013   PCP:  Thomes Dinning, MD Pharmacy:   [facility] [address] [place]  [place] [address] Phone: [phone] Fax: [phone]     Social Determinants of Health (SDOH) Interventions    Readmission Risk Interventions No flowsheet data found.

## 2018-09-07 NOTE — Progress Notes (Signed)
Patient vomited x 2 undigested food at approximately 1949. Had zofrn IV @ 2012 with good effect.

## 2018-09-07 NOTE — Progress Notes (Signed)
Patient had a restless night due to poor pain control despite the administration of various pain medications throughout the shift. She slept for short periods. Unable to reposition as she was not willing to move. Cold therapy in place

## 2018-09-07 NOTE — Progress Notes (Addendum)
Subjective: Patient reports pain as moderate to severe.  Significantly improved after knee immobilizer was loosened.  Nausea with an episode of vomiting after eating yesterday.  This has also improved but she has not eaten significant solid food yet.  Urinating.   No CP, SOB.  Not yet OOB.  Per nursing team who discussed patient with her husband: The patient drinks approximately 1 bottle of wine per night.  Last drink Wednesday.  CIWA protocol initiated.  Objective:   VITALS:   Vitals:   09/06/18 2150 09/07/18 0038 09/07/18 0614 09/07/18 0727  BP: 133/78 118/68 (!) 148/84   Pulse: 94 92 (!) 104   Resp: 14 18 14    Temp: 98 F (36.7 C) 98.4 F (36.9 C) 98.4 F (36.9 C)   TempSrc: Oral Oral Oral   SpO2: 100% 100% 100% 100%  Weight:      Height:       CBC Latest Ref Rng & Units 09/07/2018 08/26/2018  WBC 4.0 - 10.5 K/uL 7.9 5.8  Hemoglobin 12.0 - 15.0 g/dL 10.7(L) 12.6  Hematocrit 36.0 - 46.0 % 32.1(L) 37.8  Platelets 150 - 400 K/uL 328 435(H)   BMP Latest Ref Rng & Units 09/07/2018 08/26/2018  Glucose 70 - 99 mg/dL 113(H) 100(H)  BUN 8 - 23 mg/dL 10 16  Creatinine 0.44 - 1.00 mg/dL 0.56 0.93  Sodium 135 - 145 mmol/L 135 136  Potassium 3.5 - 5.1 mmol/L 4.1 4.8  Chloride 98 - 111 mmol/L 101 102  CO2 22 - 32 mmol/L 24 26  Calcium 8.9 - 10.3 mg/dL 8.4(L) 9.2   Intake/Output      06/26 0701 - 06/27 0700 06/27 0701 - 06/28 0700   P.O. 180    I.V. (mL/kg) 2833.2 (44.3)    IV Piggyback 300    Total Intake(mL/kg) 3313.2 (51.8)    Urine (mL/kg/hr) 1000    Emesis/NG output 200    Blood 300    Total Output 1500    Net +1813.2            Physical Exam: General: NAD.  Supine in bed on arrival.  Calm and conversant. Resp: No increased wob Cardio: regular rate and rhythm ABD soft Neurologically intact MSK LLE: Knee immobilizer in place and tight.  This was loosened relieving some discomfort. Tender to palpation proximal femur. Neurovascularly intact Sensation intact  distally Intact pulses distally Dorsiflexion/Plantar flexion intact Incision: dressing C/D/I   Assessment: 1 Day Post-Op  S/P Procedure(s) (LRB): TOTAL HIP ARTHROPLASTY (Left) by Dr. French Ana on 09/06/2018  Active Problems:   Degenerative joint disease of left hip   Primary osteoarthritis, status post total hip arthroplasty Significant discomfort in the a.m. and overnight- improved with loosening of knee immobilizer, but will check femur x-ray. Not yet tolerating much solid food.  Some nausea and an episode of vomiting after pain medicine and food yesterday. Not yet mobilized. Voiding.   Plan: Advance diet Up with therapy  Check femur x-ray - Resulted: No acute abnormality Add baclofen for muscle spasm Incentive Spirometry Apply ice PRN CIWA protocol / Ativan PRN for reported history of significant regular alcohol use.   Weight Bearing: Weight Bearing as Tolerated (WBAT) LLE.  Posterior hip precautions Dressings: Maintain Aquacel.   VTE prophylaxis: Eliquis twice daily, SCDs, ambulation Dispo: Remain inpatient for pain control and improved mobilization.  Anticipated LOS equal to or greater than 2 midnights due to - Age 30 and older with one or more of the following:  - Obesity  -  Expected need for hospital services (PT, OT, Nursing) required for safe  discharge  - Anticipated need for postoperative skilled nursing care or inpatient rehab  - Active co-morbidities: Anemia OR   - Unanticipated findings during/Post Surgery: Slow post-op progression: GI, pain control, mobility    Prudencio Burly III, PA-C 09/07/2018, 8:12 AM

## 2018-09-07 NOTE — Evaluation (Signed)
Occupational Therapy Evaluation Patient Details Name: Beverly [name] MRN: [id number] DOB: 22-Mar-1950 Today's Date: 09/07/2018    History of Present Illness Pt s/p L THR    Clinical Impression   Pt was admitted for the above sx (posterior approach).  She was seen in 2 parts today due to being limited by pain and lightheaded earlier with PT.  Pt was able to take a few steps to commode, but is limited by pain and fatique.  She will have husband assist with adls when she goes home. Will follow in acute setting with supervision to min guard level goals    Follow Up Recommendations  Supervision/Assistance - 24 hour    Equipment Recommendations  3 in 1 bedside commode(delivered)    Recommendations for Other Services       Precautions / Restrictions Precautions Precautions: Posterior Hip;Fall Restrictions Weight Bearing Restrictions: No      Mobility Bed Mobility           Sit to supine: Min assist;Mod assist;+2 for physical assistance   General bed mobility comments: assist for bil LEs and to guide trunk. Pt used trapeze to assist with straightening up and pulling up to top of bed  Transfers   Equipment used: Rolling walker (2 wheeled)   Sit to Stand: Min assist         General transfer comment: cues for UE/LE placement    Balance                                           ADL either performed or assessed with clinical judgement   ADL Overall ADL's : Needs assistance/impaired             Lower Body Bathing: Moderate assistance;Sit to/from stand       Lower Body Dressing: Maximal assistance;Sit to/from stand   Toilet Transfer: Minimal assistance;Ambulation;BSC;RW   Toileting- Clothing Manipulation and Hygiene: Minimal assistance;Sit to/from stand         General ADL Comments: pt plans to have husband assist for adls.  She is not interested in getting AE. Reviewed THPs in ADL context.  Pt can perform UB adls with set  up/supervision. Needs reinforcement with THPs but named 2/3 (and 3rd with cue)     Vision         Perception     Praxis      Pertinent Vitals/Pain       Hand Dominance     Extremity/Trunk Assessment Upper Extremity Assessment Upper Extremity Assessment: Overall WFL for tasks assessed           Communication Communication Communication: No difficulties   Cognition Arousal/Alertness: Awake/alert Behavior During Therapy: WFL for tasks assessed/performed;Anxious Overall Cognitive Status: Within Functional Limits for tasks assessed                                     General Comments       Exercises     Shoulder Instructions      Home Living Family/patient expects to be discharged to:: Private residence Living Arrangements: Spouse/significant other Available Help at Discharge: Family               Bathroom Shower/Tub: Walk-in shower(6-8 inch ledge; grab bar outside)   [employer]: Standard     Home Equipment: Environmental consultant -  2 wheels;Cane - single point   Additional Comments: 3:1 delivered to room      Prior Functioning/Environment Level of Independence: Independent                 OT Problem List: Decreased strength;Decreased activity tolerance;Pain;Decreased knowledge of use of DME or AE;Decreased knowledge of precautions      OT Treatment/Interventions: Self-care/ADL training;Energy conservation;DME and/or AE instruction;Patient/family education;Therapeutic activities    OT Goals(Current goals can be found in the care plan section) Acute Rehab OT Goals Patient Stated Goal: Regain IND OT Goal Formulation: With patient Time For Goal Achievement: 09/21/18 Potential to Achieve Goals: Good ADL Goals Pt Will Transfer to Toilet: with min guard assist;ambulating;bedside commode Pt Will Perform Toileting - Clothing Manipulation and hygiene: with min guard assist;sit to/from stand Additional ADL Goal #1: pt will verbalize 3/3  posterior THPs and state what husband needs to do to help her for her to maintain precautions Additional ADL Goal #2: pt will demonstrate with min guard vs verbalize steps to get into shower stall (6-8" ledge)  OT Frequency: Min 2X/week   Barriers to D/C:            Co-evaluation PT/OT/SLP Co-Evaluation/Treatment: Yes Reason for Co-Treatment: For patient/therapist safety PT goals addressed during session: Mobility/safety with mobility OT goals addressed during session: ADL's and self-care      AM-PAC OT "6 Clicks" Daily Activity     Outcome Measure Help from another person eating meals?: A Little Help from another person taking care of personal grooming?: A Little Help from another person toileting, which includes using toliet, bedpan, or urinal?: A Little Help from another person bathing (including washing, rinsing, drying)?: A Lot Help from another person to put on and taking off regular upper body clothing?: A Little Help from another person to put on and taking off regular lower body clothing?: A Lot 6 Click Score: 16   End of Session    Activity Tolerance: Patient limited by fatigue;Patient limited by pain Patient left: in bed;with call bell/phone within reach;with bed alarm set  OT Visit Diagnosis: Pain Pain - Right/Left: Left Pain - part of body: Hip                Time: 1856-3149 and 7026-3785 OT Time Calculation (min): 46 min Charges:  OT General Charges $OT Visit: 1 Visit OT Evaluation $OT Eval Low Complexity: 1 Low OT Treatments $Self Care/Home Management : 8-22 mins  Lesle Chris, OTR/L Acute Rehabilitation Services [phone] [facility] pager [phone] office 09/07/2018  [place] 09/07/2018, 4:03 PM

## 2018-09-07 NOTE — Plan of Care (Signed)

## 2018-09-07 NOTE — Plan of Care (Signed)
  Problem: Education: Goal: Knowledge of General Education information will improve Description: Including pain rating scale, medication(s)/side effects and non-pharmacologic comfort measures 09/07/2018 1530 by Hubert Azure, RN Outcome: Progressing 09/07/2018 1417 by Hubert Azure, RN Outcome: Progressing   Problem: Health Behavior/Discharge Planning: Goal: Ability to manage health-related needs will improve 09/07/2018 1530 by Hubert Azure, RN Outcome: Progressing 09/07/2018 1417 by Hubert Azure, RN Outcome: Progressing   Problem: Clinical Measurements: Goal: Ability to maintain clinical measurements within normal limits will improve 09/07/2018 1530 by Hubert Azure, RN Outcome: Progressing 09/07/2018 1417 by Hubert Azure, RN Outcome: Progressing Goal: Will remain free from infection 09/07/2018 1530 by Hubert Azure, RN Outcome: Progressing 09/07/2018 1417 by Hubert Azure, RN Outcome: Progressing Goal: Diagnostic test results will improve 09/07/2018 1530 by Hubert Azure, RN Outcome: Progressing 09/07/2018 1417 by Hubert Azure, RN Outcome: Progressing Goal: Respiratory complications will improve Outcome: Progressing Goal: Cardiovascular complication will be avoided Outcome: Progressing   Problem: Activity: Goal: Risk for activity intolerance will decrease Outcome: Progressing   Problem: Nutrition: Goal: Adequate nutrition will be maintained Outcome: Progressing   Problem: Coping: Goal: Level of anxiety will decrease Outcome: Progressing   Problem: Elimination: Goal: Will not experience complications related to bowel motility Outcome: Progressing Goal: Will not experience complications related to urinary retention Outcome: Progressing   Problem: Pain Managment: Goal: General experience of comfort will improve Outcome: Progressing   Problem: Safety: Goal: Ability to remain free from injury will improve Outcome: Progressing   Problem: Skin  Integrity: Goal: Risk for impaired skin integrity will decrease Outcome: Progressing

## 2018-09-07 NOTE — Evaluation (Signed)
Physical Therapy Evaluation Patient Details Name: Beverly [name] MRN: [id number] DOB: [birth year] Today's Date: 09/07/2018   History of Present Illness  Pt s/p L THR   Clinical Impression  Pt s/p L THR and presents with decreased L LE strength/ROM, post op pain, posterior THP and elevated anxiety level limiting functional mobility.  Pt should progress to dc home with family assist.    Follow Up Recommendations Home health PT;Follow surgeon's recommendation for DC plan and follow-up therapies    Equipment Recommendations  3in1 (PT)    Recommendations for Other Services OT consult     Precautions / Restrictions Precautions Precautions: Posterior Hip;Fall Precaution Booklet Issued: Yes (comment) Restrictions Weight Bearing Restrictions: No      Mobility  Bed Mobility Overal bed mobility: Needs Assistance Bed Mobility: Supine to Sit     Supine to sit: Mod assist;+2 for physical assistance;+2 for safety/equipment     General bed mobility comments: INcreased time with cues for sequence, adherence to THP and use of R LE to self assist  Transfers Overall transfer level: Needs assistance Equipment used: Rolling walker (2 wheeled) Transfers: Sit to/from Stand Sit to Stand: Min assist;+2 physical assistance;+2 safety/equipment;From elevated surface         General transfer comment: cues for LE management, use of UEs to self assist and adherence to THP  Ambulation/Gait Ambulation/Gait assistance: Min assist;+2 physical assistance;+2 safety/equipment Gait Distance (Feet): 4 Feet Assistive device: Rolling walker (2 wheeled) Gait Pattern/deviations: Step-to pattern;Decreased step length - right;Decreased step length - left;Shuffle;Trunk flexed Gait velocity: decr   General Gait Details: cues for sequence, posture and position from RW - distance ltd by c/o dizziness  Stairs            Wheelchair Mobility    Modified Rankin (Stroke Patients Only)       Balance  Overall balance assessment: Mild deficits observed, not formally tested                                           Pertinent Vitals/Pain Pain Assessment: 0-10 Pain Score: 5  Pain Location: L hip Pain Descriptors / Indicators: Aching;Sore Pain Intervention(s): Limited activity within patient's tolerance;Monitored during session;Premedicated before session;Ice applied    Home Living Family/patient expects to be discharged to:: Private residence Living Arrangements: Spouse/significant other Available Help at Discharge: Family Type of Home: House Home Access: Stairs to enter Entrance Stairs-Rails: Right Entrance Stairs-Number of Steps: 4 Home Layout: One level Home Equipment: Environmental consultant - 2 wheels;Cane - single point      Prior Function Level of Independence: Independent               Hand Dominance        Extremity/Trunk Assessment   Upper Extremity Assessment Upper Extremity Assessment: Overall WFL for tasks assessed    Lower Extremity Assessment Lower Extremity Assessment: LLE deficits/detail LLE Deficits / Details: AAROM at hip to 50 flex and 5 abd; Strength at hip 2/5    Cervical / Trunk Assessment Cervical / Trunk Assessment: Normal  Communication   Communication: No difficulties  Cognition Arousal/Alertness: Awake/alert Behavior During Therapy: WFL for tasks assessed/performed;Anxious Overall Cognitive Status: Within Functional Limits for tasks assessed  General Comments      Exercises Total Joint Exercises Ankle Circles/Pumps: AROM;Both;15 reps;Supine Quad Sets: AROM;Both;10 reps;Supine Heel Slides: AAROM;Left;15 reps;Supine Hip ABduction/ADduction: AAROM;Left;10 reps;Supine   Assessment/Plan    PT Assessment Patient needs continued PT services  PT Problem List Decreased strength;Decreased range of motion;Decreased activity tolerance;Decreased mobility;Decreased knowledge of  use of DME;Pain       PT Treatment Interventions DME instruction;Gait training;Stair training;Functional mobility training;Therapeutic activities;Therapeutic exercise;Patient/family education    PT Goals (Current goals can be found in the Care Plan section)  Acute Rehab PT Goals Patient Stated Goal: Regain IND PT Goal Formulation: With patient Time For Goal Achievement: 09/14/18 Potential to Achieve Goals: Good    Frequency 7X/week   Barriers to discharge        Co-evaluation               AM-PAC PT "6 Clicks" Mobility  Outcome Measure Help needed turning from your back to your side while in a flat bed without using bedrails?: A Lot Help needed moving from lying on your back to sitting on the side of a flat bed without using bedrails?: A Lot Help needed moving to and from a bed to a chair (including a wheelchair)?: A Lot Help needed standing up from a chair using your arms (e.g., wheelchair or bedside chair)?: A Lot Help needed to walk in hospital room?: A Lot Help needed climbing 3-5 steps with a railing? : Total 6 Click Score: 11    End of Session Equipment Utilized During Treatment: Gait belt Activity Tolerance: Patient tolerated treatment well Patient left: in chair;with call bell/phone within reach;with chair alarm set Nurse Communication: Mobility status PT Visit Diagnosis: Difficulty in walking, not elsewhere classified (R26.2)    Time: 6195-0932 PT Time Calculation (min) (ACUTE ONLY): 40 min   Charges:   PT Evaluation $PT Eval Low Complexity: 1 Low PT Treatments $Gait Training: 8-22 mins $Therapeutic Exercise: 8-22 mins        [place] Pager [phone] Office [phone]   Arlinda Barcelona 09/07/2018, 1:01 PM

## 2018-09-07 NOTE — Progress Notes (Signed)
Physical Therapy Treatment Patient Details Name: Beverly [name] MRN: [id number] DOB: August 25, 1950 Today's Date: 09/07/2018    History of Present Illness Pt s/p L THR     PT Comments    Pt cooperative but progressing slowly with mobility 2* post op pain/fatigue.   Follow Up Recommendations  Home health PT;Follow surgeon's recommendation for DC plan and follow-up therapies     Equipment Recommendations  3in1 (PT)    Recommendations for Other Services OT consult     Precautions / Restrictions Precautions Precautions: Posterior Hip;Fall Precaution Comments: Pt recalls 2/3 THP without cues Restrictions Weight Bearing Restrictions: No Other Position/Activity Restrictions: WBAT    Mobility  Bed Mobility Overal bed mobility: Needs Assistance Bed Mobility: Sit to Supine       Sit to supine: Min assist;Mod assist;+2 for physical assistance;+2 for safety/equipment   General bed mobility comments: assist for bil LEs and to guide trunk. Pt used trapeze to assist with straightening up and pulling up to top of bed  Transfers Overall transfer level: Needs assistance Equipment used: Rolling walker (2 wheeled) Transfers: Sit to/from Stand Sit to Stand: Min assist         General transfer comment: cues for UE/LE placement  Ambulation/Gait Ambulation/Gait assistance: Min assist;+2 physical assistance;+2 safety/equipment Gait Distance (Feet): 7 Feet(twice) Assistive device: Rolling walker (2 wheeled) Gait Pattern/deviations: Step-to pattern;Decreased step length - right;Decreased step length - left;Shuffle;Trunk flexed Gait velocity: decr   General Gait Details: cues for sequence, posture and position from RW - distance ltd by fatigue   Stairs             Wheelchair Mobility    Modified Rankin (Stroke Patients Only)       Balance Overall balance assessment: Mild deficits observed, not formally tested                                          Cognition Arousal/Alertness: Awake/alert Behavior During Therapy: [facility] for tasks assessed/performed;Anxious Overall Cognitive Status: Within Functional Limits for tasks assessed                                        Exercises      General Comments        Pertinent Vitals/Pain Pain Assessment: 0-10 Pain Score: 5  Pain Location: L hip Pain Descriptors / Indicators: Aching;Sore Pain Intervention(s): Limited activity within patient's tolerance;Monitored during session;Premedicated before session;Ice applied    Home Living Family/patient expects to be discharged to:: Private residence Living Arrangements: Spouse/significant other Available Help at Discharge: Family         Home Equipment: Gilford Rile - 2 wheels;Cane - single point Additional Comments: 3:1 delivered to room    Prior Function Level of Independence: Independent          PT Goals (current goals can now be found in the care plan section) Acute Rehab PT Goals Patient Stated Goal: Regain IND PT Goal Formulation: With patient Time For Goal Achievement: 09/14/18 Potential to Achieve Goals: Good Progress towards PT goals: Progressing toward goals    Frequency    7X/week      PT Plan Current plan remains appropriate    Co-evaluation PT/OT/SLP Co-Evaluation/Treatment: Yes Reason for Co-Treatment: For patient/therapist safety PT goals addressed during session: Mobility/safety with mobility OT goals addressed during  session: ADL's and self-care      AM-PAC PT "6 Clicks" Mobility   Outcome Measure  Help needed turning from your back to your side while in a flat bed without using bedrails?: A Lot Help needed moving from lying on your back to sitting on the side of a flat bed without using bedrails?: A Lot Help needed moving to and from a bed to a chair (including a wheelchair)?: A Lot Help needed standing up from a chair using your arms (e.g., wheelchair or bedside chair)?: A Little Help  needed to walk in hospital room?: A Lot Help needed climbing 3-5 steps with a railing? : Total 6 Click Score: 12    End of Session Equipment Utilized During Treatment: Gait belt Activity Tolerance: Patient tolerated treatment well Patient left: in bed;with call bell/phone within reach;with bed alarm set Nurse Communication: Mobility status PT Visit Diagnosis: Difficulty in walking, not elsewhere classified (R26.2)     Time: 7510-2585 PT Time Calculation (min) (ACUTE ONLY): 40 min  Charges:  $Gait Training: 8-22 mins $Therapeutic Activity: 8-22 mins                     [place] Pager [phone] Office [phone]    Breean Nannini 09/07/2018, 4:42 PM

## 2018-09-08 LAB — CBC
HCT: 31 % — ABNORMAL LOW (ref 36.0–46.0)
Hemoglobin: 10 g/dL — ABNORMAL LOW (ref 12.0–15.0)
MCH: 31.8 pg (ref 26.0–34.0)
MCHC: 32.3 g/dL (ref 30.0–36.0)
MCV: 98.7 fL (ref 80.0–100.0)
Platelets: 302 10*3/uL (ref 150–400)
RBC: 3.14 MIL/uL — ABNORMAL LOW (ref 3.87–5.11)
RDW: 11.9 % (ref 11.5–15.5)
WBC: 9.1 10*3/uL (ref 4.0–10.5)
nRBC: 0 % (ref 0.0–0.2)

## 2018-09-08 MED ORDER — BACLOFEN 10 MG PO TABS: 10.0000 mg | ORAL_TABLET | Freq: Three times a day (TID) | ORAL | 0 refills | Status: AC | PRN

## 2018-09-08 MED ORDER — OXYCODONE HCL 5 MG PO TABS: ORAL_TABLET | ORAL | 0 refills | Status: AC

## 2018-09-08 MED ORDER — OXYCODONE HCL 5 MG PO TABS: ORAL_TABLET | ORAL | 0 refills | Status: DC

## 2018-09-08 MED ORDER — APIXABAN 2.5 MG PO TABS: 2.5000 mg | ORAL_TABLET | Freq: Two times a day (BID) | ORAL | 0 refills | Status: DC

## 2018-09-08 MED ORDER — BACLOFEN 10 MG PO TABS: 10.0000 mg | ORAL_TABLET | Freq: Three times a day (TID) | ORAL | 0 refills | Status: DC | PRN

## 2018-09-08 MED ORDER — APIXABAN 2.5 MG PO TABS: 2.5000 mg | ORAL_TABLET | Freq: Two times a day (BID) | ORAL | 0 refills | Status: AC

## 2018-09-08 NOTE — Progress Notes (Signed)
Physical Therapy Treatment Patient Details Name: Beverly [name] MRN: [id number] DOB: 21-Jun-1950 Today's Date: 09/08/2018    History of Present Illness Pt is a 68 y.o. female s/p posterior L THA. PMH significant for hyperlipidemia, hypertension, and chronic respiratory failure with hypoxia.     PT Comments     Pt continues cooperative and progressing slowly but steadily with mobility.  Increased time, physical assist and frequent cueing for all tasks - pt transferred in/out of bed, negotiated stairs, reviewed car transfers and ambulated short distance in hall.   Follow Up Recommendations  Home health PT;Follow surgeon's recommendation for DC plan and follow-up therapies     Equipment Recommendations  3in1 (PT)    Recommendations for Other Services OT consult     Precautions / Restrictions Precautions Precautions: Posterior Hip;Fall Precaution Booklet Issued: Yes (comment) Precaution Comments: Pt recalls 2/3 precautions without cues today; handout provided Restrictions Weight Bearing Restrictions: No Other Position/Activity Restrictions: WBAT    Mobility  Bed Mobility Overal bed mobility: Needs Assistance Bed Mobility: Supine to Sit;Sit to Supine     Supine to sit: Min assist;Mod assist Sit to supine: Min assist;Mod assist   General bed mobility comments: Increased time with cues for sequence, use of R LE to self assist and adherence to THP  Transfers Overall transfer level: Needs assistance Equipment used: Rolling walker (2 wheeled) Transfers: Sit to/from Stand Sit to Stand: Min assist         General transfer comment: cues for UE/LE placement and adherence to THP  Ambulation/Gait Ambulation/Gait assistance: Min assist Gait Distance (Feet): 56 Feet Assistive device: Rolling walker (2 wheeled) Gait Pattern/deviations: Step-to pattern;Decreased step length - right;Decreased step length - left;Shuffle;Trunk flexed Gait velocity: decr   General Gait Details:  increased time with cues for sequence, ER on L, posture and position from RW    Stairs Stairs: Yes Stairs assistance: Min assist Stair Management: One rail Left;Step to pattern;Forwards;With cane Number of Stairs: 5 General stair comments: cues for sequence and foot/cane placement   Wheelchair Mobility    Modified Rankin (Stroke Patients Only)       Balance Overall balance assessment: Mild deficits observed, not formally tested                                          Cognition Arousal/Alertness: Awake/alert Behavior During Therapy: WFL for tasks assessed/performed Overall Cognitive Status: Within Functional Limits for tasks assessed                                 General Comments: Slow to process cues       Exercises      General Comments General comments (skin integrity, edema, etc.): Pt very limited by pain. Feel she is not ready for transition to home from OT perspective.       Pertinent Vitals/Pain Pain Assessment: 0-10 Pain Score: 7  Pain Location: L hip with mobility Pain Descriptors / Indicators: Aching;Sore Pain Intervention(s): Limited activity within patient's tolerance;Monitored during session;Premedicated before session;Ice applied    Home Living                      Prior Function            PT Goals (current goals can now be found in the care plan section) Acute  Rehab PT Goals Patient Stated Goal: Go home today PT Goal Formulation: With patient Time For Goal Achievement: 09/14/18 Potential to Achieve Goals: Good Progress towards PT goals: Progressing toward goals    Frequency    7X/week      PT Plan Current plan remains appropriate    Co-evaluation              AM-PAC PT "6 Clicks" Mobility   Outcome Measure  Help needed turning from your back to your side while in a flat bed without using bedrails?: A Lot Help needed moving from lying on your back to sitting on the side of a flat  bed without using bedrails?: A Lot Help needed moving to and from a bed to a chair (including a wheelchair)?: A Lot Help needed standing up from a chair using your arms (e.g., wheelchair or bedside chair)?: A Little Help needed to walk in hospital room?: A Little Help needed climbing 3-5 steps with a railing? : A Little 6 Click Score: 15    End of Session Equipment Utilized During Treatment: Gait belt Activity Tolerance: Patient tolerated treatment well Patient left: in chair;with call bell/phone within reach;with chair alarm set Nurse Communication: Mobility status PT Visit Diagnosis: Difficulty in walking, not elsewhere classified (R26.2)     Time: 1962-2297 PT Time Calculation (min) (ACUTE ONLY): 39 min  Charges:  $Gait Training: 8-22 mins $Therapeutic Activity: 23-37 mins                     Debe Coder PT Acute Rehabilitation Services Pager [phone] Office [phone]    Jamesetta Greenhalgh 09/08/2018, 5:18 PM

## 2018-09-08 NOTE — Plan of Care (Signed)

## 2018-09-08 NOTE — Progress Notes (Signed)
Physical Therapy Treatment Patient Details Name: BERYL HORNBERGER MRN: [id number] DOB: 07/24/1950 Today's Date: 09/08/2018    History of Present Illness Pt s/p L THR     PT Comments    Pt cooperative and progressing today with mobility but requiring increased time all tasks.   Follow Up Recommendations  Home health PT;Follow surgeon's recommendation for DC plan and follow-up therapies     Equipment Recommendations  3in1 (PT)    Recommendations for Other Services OT consult     Precautions / Restrictions Precautions Precautions: Posterior Hip;Fall Precaution Booklet Issued: Yes (comment) Precaution Comments: Pt recalls 2/3 THP without cues Restrictions Weight Bearing Restrictions: No Other Position/Activity Restrictions: WBAT    Mobility  Bed Mobility               General bed mobility comments: Pt up in chair and requests back to same  Transfers Overall transfer level: Needs assistance Equipment used: Rolling walker (2 wheeled) Transfers: Sit to/from Stand Sit to Stand: Min assist;+2 physical assistance         General transfer comment: cues for UE/LE placement  Ambulation/Gait Ambulation/Gait assistance: Min assist;+2 safety/equipment Gait Distance (Feet): 74 Feet Assistive device: Rolling walker (2 wheeled) Gait Pattern/deviations: Step-to pattern;Decreased step length - right;Decreased step length - left;Shuffle;Trunk flexed Gait velocity: decr   General Gait Details: increased time with cues for sequence, ER on L, posture and position from RW - distance ltd by fatigue   Stairs             Wheelchair Mobility    Modified Rankin (Stroke Patients Only)       Balance Overall balance assessment: Mild deficits observed, not formally tested                                          Cognition Arousal/Alertness: Awake/alert Behavior During Therapy: [facility] for tasks assessed/performed;Anxious Overall Cognitive Status: Within  Functional Limits for tasks assessed                                        Exercises      General Comments        Pertinent Vitals/Pain Pain Assessment: 0-10 Pain Score: 7  Pain Location: L hip with mobility Pain Descriptors / Indicators: Aching;Sore Pain Intervention(s): Limited activity within patient's tolerance;Monitored during session;Premedicated before session;Ice applied    Home Living                      Prior Function            PT Goals (current goals can now be found in the care plan section) Acute Rehab PT Goals Patient Stated Goal: Regain IND PT Goal Formulation: With patient Time For Goal Achievement: 09/14/18 Potential to Achieve Goals: Good Progress towards PT goals: Progressing toward goals    Frequency    7X/week      PT Plan Current plan remains appropriate    Co-evaluation              AM-PAC PT "6 Clicks" Mobility   Outcome Measure  Help needed turning from your back to your side while in a flat bed without using bedrails?: A Lot Help needed moving from lying on your back to sitting on the side of a flat  bed without using bedrails?: A Lot Help needed moving to and from a bed to a chair (including a wheelchair)?: A Lot Help needed standing up from a chair using your arms (e.g., wheelchair or bedside chair)?: A Little Help needed to walk in hospital room?: A Little Help needed climbing 3-5 steps with a railing? : A Lot 6 Click Score: 14    End of Session Equipment Utilized During Treatment: Gait belt Activity Tolerance: Patient tolerated treatment well Patient left: in chair;with call bell/phone within reach;with chair alarm set Nurse Communication: Mobility status PT Visit Diagnosis: Difficulty in walking, not elsewhere classified (R26.2)     Time: 1657-9038 PT Time Calculation (min) (ACUTE ONLY): 28 min  Charges:  $Gait Training: 23-37 mins                     [place] Pager [phone] Office [phone]    Baruch Lewers 09/08/2018, 11:05 AM

## 2018-09-08 NOTE — Progress Notes (Signed)
Subjective: Patient reports pain as moderate and somewhat improved.  Tolerating some diet - hungry.  No N/V. Urinating.   No CP, SOB.  Slow to mobilize but improving.  Patient feels safe enough with progress and support at home to discharge later today barring any significant setbacks to mobilization.  09/07/18: Per nursing team who discussed patient with her husband: The patient drinks approximately 1 bottle of wine per night.  Last drink Wednesday.  CIWA protocol initiated 09/07/18.    Objective:   VITALS:   Vitals:   09/07/18 2215 09/08/18 0000 09/08/18 0533 09/08/18 0727  BP: (!) 152/85 135/83 (!) 146/83   Pulse: 96 95 94   Resp: 14  16   Temp: 99.6 F (37.6 C)  98.9 F (37.2 C)   TempSrc: Oral  Oral   SpO2: 99%  99% 95%  Weight:      Height:       CBC Latest Ref Rng & Units 09/08/2018 09/07/2018 08/26/2018  WBC 4.0 - 10.5 K/uL 9.1 7.9 5.8  Hemoglobin 12.0 - 15.0 g/dL 10.0(L) 10.7(L) 12.6  Hematocrit 36.0 - 46.0 % 31.0(L) 32.1(L) 37.8  Platelets 150 - 400 K/uL 302 328 435(H)   BMP Latest Ref Rng & Units 09/07/2018 08/26/2018  Glucose 70 - 99 mg/dL 113(H) 100(H)  BUN 8 - 23 mg/dL 10 16  Creatinine 0.44 - 1.00 mg/dL 0.56 0.93  Sodium 135 - 145 mmol/L 135 136  Potassium 3.5 - 5.1 mmol/L 4.1 4.8  Chloride 98 - 111 mmol/L 101 102  CO2 22 - 32 mmol/L 24 26  Calcium 8.9 - 10.3 mg/dL 8.4(L) 9.2   Intake/Output      06/27 0701 - 06/28 0700 06/28 0701 - 06/29 0700   P.O. 230    I.V. (mL/kg) 878.9 (13.7)    IV Piggyback 500    Total Intake(mL/kg) 1608.9 (25.1)    Urine (mL/kg/hr) 1060 (0.7)    Emesis/NG output     Blood     Total Output 1060    Net +548.9           Physical Exam: General: NAD.  Upright in chair with breakfast on arrival.  Calm and conversant. Resp: No increased wob Cardio: regular rate and rhythm ABD soft Neurologically intact MSK LLE: Moderate / improved tenderness to palpation proximal femur. Neurovascularly intact Sensation intact distally  Intact pulses distally Dorsiflexion/Plantar flexion intact Incision: dressing C/D/I   Assessment: 2 Days Post-Op  S/P Procedure(s) (LRB): TOTAL HIP ARTHROPLASTY (Left) by Dr. French Ana on 09/06/2018  Active Problems:   Degenerative joint disease of left hip   S/P total hip arthroplasty   Primary osteoarthritis, status post total hip arthroplasty Slow but improving mobilization and pain control. Tolerating liquids and more solids.   Voiding.   Plan: Advance diet Up with therapy  Incentive Spirometry Apply ice PRN Smoking cessation discussed Continue CIWA protocol / Ativan PRN for husband reported history of significant regular alcohol use.   Weight Bearing: Weight Bearing as Tolerated (WBAT) LLE.  Posterior hip precautions Dressings: Maintain Aquacel.   VTE prophylaxis: Eliquis twice daily, SCDs, ambulation Dispo: Therapy evaluations ongoing.  Possible discharge later today.   Patient feels safe with progress and support at home to discharge later today.  Anticipated LOS equal to or greater than 2 midnights due to - Age 67 and older with one or more of the following:  - Obesity  - Expected need for hospital services (PT, OT, Nursing) required for safe  discharge  -  Active co-morbidities: Anemia OR   - Unanticipated findings during/Post Surgery: Slow post-op progression: GI, pain control, mobility    Prudencio Burly III, PA-C 09/08/2018, 8:47 AM

## 2018-09-08 NOTE — TOC Progression Note (Signed)
Transition of Care [facility]) - Progression Note    Patient Details  Name: Beverly [name] MRN: [id number] Date of Birth: 1950-04-01  [facility] CM/SW Contact  Joaquin Courts, RN Phone Number: 09/08/2018, 12:16 PM  Clinical Narrative:    [facility]    Expected Discharge Plan: [place] Barriers to Discharge: No Barriers Identified  Expected Discharge Plan and Services Expected Discharge Plan: [place]   Discharge Planning Services: CM Consult Post Acute Care Choice: [place] arrangements for the past 2 months: Single Family Home Expected Discharge Date: 09/08/18               DME Arranged: N/A DME Agency: NA Date DME Agency Contacted: 09/07/18 Time DME Agency Contacted: [phone] Representative spoke with at DME Agency: [place]: PT [place]: [place] Date [place]: 09/08/18 Time [place]: 1215 Representative spoke with at [place]: [place] (SDOH) Interventions    Readmission Risk Interventions No flowsheet data found.

## 2018-09-08 NOTE — Progress Notes (Addendum)
Physical Therapy Treatment Patient Details Name: Beverly [name] MRN: [id number] DOB: 05/07/1950 Today's Date: 09/08/2018    History of Present Illness Pt is a 68 y.o. female s/p posterior L THA. PMH significant for hyperlipidemia, hypertension, and chronic respiratory failure with hypoxia.     PT Comments    Pt continues cooperative but struggling with pain during mobility and limited this session by c/o increasing dizziness - BP 119/71 on return to chair - RN aware.   Follow Up Recommendations  Home health PT;Follow surgeon's recommendation for DC plan and follow-up therapies     Equipment Recommendations  3in1 (PT)    Recommendations for Other Services OT consult     Precautions / Restrictions Precautions Precautions: Posterior Hip;Fall Precaution Booklet Issued: Yes (comment) Precaution Comments: Pt recalls 2/3 precautions without cues today; handout provided Restrictions Weight Bearing Restrictions: No Other Position/Activity Restrictions: WBAT    Mobility  Bed Mobility               General bed mobility comments: Pt up in recliner and returned to recliner outside bathroom 2* c/o increasing dizziness  Transfers Overall transfer level: Needs assistance Equipment used: Rolling walker (2 wheeled) Transfers: Sit to/from Stand Sit to Stand: Min assist         General transfer comment: cues for UE/LE placement  Ambulation/Gait Ambulation/Gait assistance: Min assist;+2 safety/equipment Gait Distance (Feet): 17 Feet(17' to bathroom and 7' back (dizzy and returned to recliner)) Assistive device: Rolling walker (2 wheeled) Gait Pattern/deviations: Step-to pattern;Decreased step length - right;Decreased step length - left;Shuffle;Trunk flexed Gait velocity: decr   General Gait Details: increased time with cues for sequence, ER on L, posture and position from RW - distance ltd by fatigue   Stairs             Wheelchair Mobility    Modified Rankin  (Stroke Patients Only)       Balance Overall balance assessment: Mild deficits observed, not formally tested                                          Cognition Arousal/Alertness: Awake/alert Behavior During Therapy: [facility] for tasks assessed/performed;Anxious Overall Cognitive Status: Within Functional Limits for tasks assessed                                        Exercises      General Comments General comments (skin integrity, edema, etc.): Pt very limited by pain. Feel she is not ready for transition to home from OT perspective.       Pertinent Vitals/Pain Pain Assessment: 0-10 Pain Score: 7  Pain Location: L hip with mobility Pain Descriptors / Indicators: Aching;Sore Pain Intervention(s): Limited activity within patient's tolerance;Monitored during session;Premedicated before session;Ice applied    Home Living                      Prior Function            PT Goals (current goals can now be found in the care plan section) Acute Rehab PT Goals Patient Stated Goal: Go home today PT Goal Formulation: With patient Time For Goal Achievement: 09/14/18 Potential to Achieve Goals: Good Progress towards PT goals: Progressing toward goals    Frequency    7X/week  PT Plan Current plan remains appropriate    Co-evaluation              AM-PAC PT "6 Clicks" Mobility   Outcome Measure  Help needed turning from your back to your side while in a flat bed without using bedrails?: A Lot Help needed moving from lying on your back to sitting on the side of a flat bed without using bedrails?: A Lot Help needed moving to and from a bed to a chair (including a wheelchair)?: A Lot Help needed standing up from a chair using your arms (e.g., wheelchair or bedside chair)?: A Little Help needed to walk in hospital room?: A Little Help needed climbing 3-5 steps with a railing? : A Lot 6 Click Score: 14    End of Session  Equipment Utilized During Treatment: Gait belt Activity Tolerance: Patient tolerated treatment well Patient left: in chair;with call bell/phone within reach;with chair alarm set Nurse Communication: Mobility status PT Visit Diagnosis: Difficulty in walking, not elsewhere classified (R26.2)     Time: 1415-1440 PT Time Calculation (min) (ACUTE ONLY): 25 min  Charges:  $Gait Training: 8-22 mins $Therapeutic Activities: 8-22 mins                     [place] Pager [phone] Office [phone]    Rykar Lebleu 09/08/2018, 3:29 PM

## 2018-09-08 NOTE — Progress Notes (Signed)
    Home health agencies that serve [id number].        Home Health Agencies Search Results  Results List Table  Home Health Agency Information Quality of Patient Care Rating Patient Survey Summary Rating  [facility] [phone] 3 out of 5 stars 4 out of 5 stars  [facility] [phone] 4  out of 5 stars 3 out of 5 stars  [facility] [phone] 4 out of 5 stars 4 out of 5 stars  [facility] [phone] 4 out of 5 stars 4 out of 5 stars  [facility] [phone] 4  out of 5 stars 4 out of 5 stars  [facility] [phone] 4 out of 5 stars 4 out of 5 stars  [facility] [phone] 3  out of 5 stars 4 out of 5 stars  [facility] [phone] 3 out of 5 stars 4 out of 5 stars  [facility] [phone] 4 out of 5 stars Not Available12  [facility] [phone] 3  out of 5 stars 3 out of 5 stars  [facility] [phone] 3  out of 5 stars 4 out of 5 stars  [facility] [phone] 3  out of 5 stars 3 out of 5 stars  [facility] [phone] 3  out of 5 stars Not Available11  [facility] [phone] 4  out of 5 stars 3 out of 5 stars   Home Health Footnotes  Footnote number Footnote as displayed on Home Health Compare  1 This agency provides services under a federal waiver program to non-traditional, chronic long term population.  2 This agency provides services to a special needs population.  3 Not Available.  4 The number of patient episodes for this measure is too small to report.  5 This measure currently does not have data or provider has been certified/recertified for less than 6 months.  6 The national average for this measure is not provided because of state-to-state differences in data collection.  7 Medicare is not displaying rates for this  measure for any home health agency, because of an issue with the data.  8 There were problems with the data and they are being corrected.  9 Zero, or very few, patients met the survey's rules for inclusion. The scores shown, if any, reflect a very small number of surveys and may not accurately tell how an agency is doing.  10 Survey results are based on less than 12 months of data.  11 Fewer than 70 patients completed the survey. Use the scores shown, if any, with caution as the number of surveys may be too low to accurately tell how an agency is doing.  12 No survey results are available for this period.  13 Data suppressed by CMS for one or more quarters.    

## 2018-09-08 NOTE — Progress Notes (Signed)
Occupational Therapy Treatment Patient Details Name: Beverly [name] MRN: [id number] DOB: [birth year] Today's Date: 09/08/2018    History of present illness Pt is a 68 y.o. female s/p posterior L THA. PMH significant for hyperlipidemia, hypertension, and chronic respiratory failure with hypoxia.    OT comments  Pt demonstrating some progress toward OT goals. She is greatly limited by pain at this time resulting in decreased independence and safety with ADL and mobility. Verbally educated concerning walk-in shower transfer; however, pt unable to successfully practice due to inability to clear 6-8" ledge while adhering to precautions for successful transfer. Today, she was able to recall only 1/3 posterior hip precautions and handout was provided. Feel that she would benefit from further therapies prior to transitioning home with 24 hour assistance from her husband.    Follow Up Recommendations  Supervision/Assistance - 24 hour    Equipment Recommendations  3 in 1 bedside commode(delivered)    Recommendations for Other Services      Precautions / Restrictions Precautions Precautions: Posterior Hip;Fall Precaution Booklet Issued: Yes (comment) Precaution Comments: Pt recalls 1/3 precautions without cues today; handout provided Restrictions Weight Bearing Restrictions: No Other Position/Activity Restrictions: WBAT       Mobility Bed Mobility               General bed mobility comments: OOB in recliner on my arrival.   Transfers Overall transfer level: Needs assistance Equipment used: Rolling walker (2 wheeled) Transfers: Sit to/from Stand Sit to Stand: Min assist;+2 physical assistance         General transfer comment: cues for UE/LE placement    Balance Overall balance assessment: Mild deficits observed, not formally tested                                         ADL either performed or assessed with clinical judgement   ADL Overall ADL's :  Needs assistance/impaired Eating/Feeding: Set up;Sitting   Grooming: Moderate assistance;Standing Grooming Details (indicate cue type and reason): assist for balance Upper Body Bathing: Supervision/ safety;Sitting   Lower Body Bathing: Maximal assistance;Sit to/from stand   Upper Body Dressing : Supervision/safety;Sitting   Lower Body Dressing: Maximal assistance;Sit to/from stand   Toilet Transfer: Minimal assistance;Ambulation;BSC;RW Toilet Transfer Details (indicate cue type and reason): Only able to tolerate short distance secondary to pain Toileting- Clothing Manipulation and Hygiene: Minimal assistance;Sit to/from Nurse, children's Details (indicate cue type and reason): Verbally educated and ran through the sequence but pt unable to complete succesfully as she is not able to lift LLE approrpiately to achieve step over 6-8" ledge   General ADL Comments: Pt educated concerning shower transfer and OT writing out process for her. Continues to be unable to recall all precautions     Vision       Perception     Praxis      Cognition Arousal/Alertness: Awake/alert Behavior During Therapy: [facility] for tasks assessed/performed;Anxious Overall Cognitive Status: Within Functional Limits for tasks assessed                                          Exercises     Shoulder Instructions       General Comments Pt very limited by pain. Feel she is not ready for transition to  home from OT perspective.     Pertinent Vitals/ Pain       Pain Assessment: 0-10 Pain Score: 9  Pain Location: L hip with mobility Pain Descriptors / Indicators: Aching;Sore Pain Intervention(s): Limited activity within patient's tolerance;Monitored during session;RN gave pain meds during session;Repositioned  Home Living                                          Prior Functioning/Environment              Frequency  Min 2X/week        Progress  Toward Goals  OT Goals(current goals can now be found in the care plan section)  Progress towards OT goals: Progressing toward goals  Acute Rehab OT Goals Patient Stated Goal: Go home today OT Goal Formulation: With patient Time For Goal Achievement: 09/21/18 Potential to Achieve Goals: Good ADL Goals Pt Will Transfer to Toilet: with min guard assist;ambulating;bedside commode Pt Will Perform Toileting - Clothing Manipulation and hygiene: with min guard assist;sit to/from stand Additional ADL Goal #1: pt will verbalize 3/3 posterior THPs and state what husband needs to do to help her for her to maintain precautions Additional ADL Goal #2: pt will demonstrate with min guard vs verbalize steps to get into shower stall (6-8" ledge)  Plan Discharge plan remains appropriate    Co-evaluation                 AM-PAC OT "6 Clicks" Daily Activity     Outcome Measure   Help from another person eating meals?: A Little Help from another person taking care of personal grooming?: A Little Help from another person toileting, which includes using toliet, bedpan, or urinal?: A Little Help from another person bathing (including washing, rinsing, drying)?: A Lot Help from another person to put on and taking off regular upper body clothing?: A Little Help from another person to put on and taking off regular lower body clothing?: A Lot 6 Click Score: 16    End of Session Equipment Utilized During Treatment: Gait belt;Rolling walker  OT Visit Diagnosis: Pain Pain - Right/Left: Left Pain - part of body: Hip   Activity Tolerance Patient limited by fatigue;Patient limited by pain   Patient Left with call bell/phone within reach;in chair;with chair alarm set   Nurse Communication Mobility status;Patient requests pain meds        Time: 1221-1251 OT Time Calculation (min): 30 min  Charges: OT General Charges $OT Visit: 1 Visit OT Treatments $Self Care/Home Management : 23-37  mins  Belva Agee, OTR/L [place] A Javontae Marlette 09/08/2018, 2:55 PM

## 2018-09-09 ENCOUNTER — Ambulatory Visit: Payer: PPO | Admitting: Physical Therapy

## 2018-09-09 ENCOUNTER — Encounter (HOSPITAL_COMMUNITY): Payer: Self-pay | Admitting: Orthopedic Surgery

## 2018-09-10 DIAGNOSIS — K5790 Diverticulosis of intestine, part unspecified, without perforation or abscess without bleeding: Secondary | ICD-10-CM | POA: Diagnosis not present

## 2018-09-10 DIAGNOSIS — J309 Allergic rhinitis, unspecified: Secondary | ICD-10-CM | POA: Diagnosis not present

## 2018-09-10 DIAGNOSIS — M5136 Other intervertebral disc degeneration, lumbar region: Secondary | ICD-10-CM | POA: Diagnosis not present

## 2018-09-10 DIAGNOSIS — K219 Gastro-esophageal reflux disease without esophagitis: Secondary | ICD-10-CM | POA: Diagnosis not present

## 2018-09-10 DIAGNOSIS — Z471 Aftercare following joint replacement surgery: Secondary | ICD-10-CM | POA: Diagnosis not present

## 2018-09-10 DIAGNOSIS — E538 Deficiency of other specified B group vitamins: Secondary | ICD-10-CM | POA: Diagnosis not present

## 2018-09-10 DIAGNOSIS — F17210 Nicotine dependence, cigarettes, uncomplicated: Secondary | ICD-10-CM | POA: Diagnosis not present

## 2018-09-10 DIAGNOSIS — J9611 Chronic respiratory failure with hypoxia: Secondary | ICD-10-CM | POA: Diagnosis not present

## 2018-09-10 DIAGNOSIS — E785 Hyperlipidemia, unspecified: Secondary | ICD-10-CM | POA: Diagnosis not present

## 2018-09-10 DIAGNOSIS — Z96642 Presence of left artificial hip joint: Secondary | ICD-10-CM | POA: Diagnosis not present

## 2018-09-10 DIAGNOSIS — I10 Essential (primary) hypertension: Secondary | ICD-10-CM | POA: Diagnosis not present

## 2018-09-10 DIAGNOSIS — G4700 Insomnia, unspecified: Secondary | ICD-10-CM | POA: Diagnosis not present

## 2018-09-10 DIAGNOSIS — F419 Anxiety disorder, unspecified: Secondary | ICD-10-CM | POA: Diagnosis not present

## 2018-09-11 ENCOUNTER — Ambulatory Visit: Payer: PPO | Admitting: Physical Therapy

## 2018-09-12 DIAGNOSIS — J309 Allergic rhinitis, unspecified: Secondary | ICD-10-CM | POA: Diagnosis not present

## 2018-09-12 DIAGNOSIS — G4700 Insomnia, unspecified: Secondary | ICD-10-CM | POA: Diagnosis not present

## 2018-09-12 DIAGNOSIS — F17210 Nicotine dependence, cigarettes, uncomplicated: Secondary | ICD-10-CM | POA: Diagnosis not present

## 2018-09-12 DIAGNOSIS — F419 Anxiety disorder, unspecified: Secondary | ICD-10-CM | POA: Diagnosis not present

## 2018-09-12 DIAGNOSIS — Z96642 Presence of left artificial hip joint: Secondary | ICD-10-CM | POA: Diagnosis not present

## 2018-09-12 DIAGNOSIS — K5790 Diverticulosis of intestine, part unspecified, without perforation or abscess without bleeding: Secondary | ICD-10-CM | POA: Diagnosis not present

## 2018-09-12 DIAGNOSIS — K219 Gastro-esophageal reflux disease without esophagitis: Secondary | ICD-10-CM | POA: Diagnosis not present

## 2018-09-12 DIAGNOSIS — I10 Essential (primary) hypertension: Secondary | ICD-10-CM | POA: Diagnosis not present

## 2018-09-12 DIAGNOSIS — E785 Hyperlipidemia, unspecified: Secondary | ICD-10-CM | POA: Diagnosis not present

## 2018-09-12 DIAGNOSIS — J9611 Chronic respiratory failure with hypoxia: Secondary | ICD-10-CM | POA: Diagnosis not present

## 2018-09-12 DIAGNOSIS — Z471 Aftercare following joint replacement surgery: Secondary | ICD-10-CM | POA: Diagnosis not present

## 2018-09-12 DIAGNOSIS — M5136 Other intervertebral disc degeneration, lumbar region: Secondary | ICD-10-CM | POA: Diagnosis not present

## 2018-09-12 DIAGNOSIS — E538 Deficiency of other specified B group vitamins: Secondary | ICD-10-CM | POA: Diagnosis not present

## 2018-09-13 ENCOUNTER — Ambulatory Visit: Payer: PPO | Admitting: Physical Therapy

## 2018-09-17 NOTE — Discharge Summary (Signed)
Discharge Summary  Patient ID: Beverly [name] MRN: [id number] DOB/AGE: 1950/11/21 68 y.o.  Admit date: 09/06/2018 Discharge date: 09/17/2018  Admission Diagnoses:  Degenerative joint disease of left hip  Discharge Diagnoses:  Principal Problem:   Degenerative joint disease of left hip Active Problems:   S/P total hip arthroplasty   Past Medical History:  Diagnosis Date  . Cancer [facility])    Skin cancer  . Hyperlipidemia   . Hypertension     Surgeries: Procedure(s): TOTAL HIP ARTHROPLASTY on 09/06/2018   Consultants (if any):   Discharged Condition: Improved  Hospital Course: Beverly [name] is an 68 y.o. female who was admitted 09/06/2018 with a diagnosis of Degenerative joint disease of left hip and went to the operating room on 09/06/2018 and underwent the above named procedures.    She was given perioperative antibiotics:  Anti-infectives (From admission, onward)   Start     Dose/Rate Route Frequency Ordered Stop   09/06/18 2000  ceFAZolin (ANCEF) IVPB 1 g/50 mL premix     1 g 100 mL/hr over 30 Minutes Intravenous Every 6 hours 09/06/18 1822 09/07/18 0229   09/06/18 1045  ceFAZolin (ANCEF) IVPB 2g/100 mL premix     2 g 200 mL/hr over 30 Minutes Intravenous On call to O.R. 09/06/18 1034 09/06/18 1417    .  She was given sequential compression devices, early ambulation, and eliquis for DVT prophylaxis.  She benefited maximally from the hospital stay and there were no complications.   She remained inpatient for pain control and to improve safe mobilization.  Recent vital signs:  Vitals:   09/08/18 1219 09/08/18 1440  BP: 130/64 119/71  Pulse: 85   Resp: 16   Temp: 97.9 F (36.6 C)   SpO2: 93%     Recent laboratory studies:  Lab Results  Component Value Date   HGB 10.0 (L) 09/08/2018   HGB 10.7 (L) 09/07/2018   HGB 12.6 08/26/2018   Lab Results  Component Value Date   WBC 9.1 09/08/2018   PLT 302 09/08/2018   No results found for: INR Lab Results   Component Value Date   NA 135 09/07/2018   K 4.1 09/07/2018   CL 101 09/07/2018   CO2 24 09/07/2018   BUN 10 09/07/2018   CREATININE 0.56 09/07/2018   GLUCOSE 113 (H) 09/07/2018    Discharge Medications:   Allergies as of 09/08/2018   No Known Allergies     Medication List    TAKE these medications   Advair HFA 45-21 MCG/ACT inhaler Generic drug: fluticasone-salmeterol Inhale 2 puffs into the lungs daily as needed (shortness of breath or wheezing).   alendronate 70 MG tablet Commonly known as: FOSAMAX Take 70 mg by mouth once a week.   apixaban 2.5 MG Tabs tablet Commonly known as: Eliquis Take 1 tablet (2.5 mg total) by mouth 2 (two) times daily.   atorvastatin 20 MG tablet Commonly known as: LIPITOR Take 20 mg by mouth daily.   baclofen 10 MG tablet Commonly known as: LIORESAL Take 1 tablet (10 mg total) by mouth 3 (three) times daily as needed for muscle spasms.   celecoxib 200 MG capsule Commonly known as: CELEBREX Take 200 mg by mouth daily as needed for moderate pain.   ezetimibe 10 MG tablet Commonly known as: ZETIA Take 10 mg by mouth daily.   fenofibrate 160 MG tablet Take 160 mg by mouth daily.   hydroxypropyl methylcellulose / hypromellose 2.5 % ophthalmic solution Commonly known as: ISOPTO  TEARS / GONIOVISC Place 1 drop into both eyes 2 (two) times daily as needed for dry eyes.   losartan 100 MG tablet Commonly known as: COZAAR Take 100 mg by mouth daily.   metoprolol succinate 100 MG 24 hr tablet Commonly known as: TOPROL-XL Take 100 mg by mouth daily.   oxyCODONE 5 MG immediate release tablet Commonly known as: Oxy IR/ROXICODONE Take one tab po q4-6hrs prn pain, may need 1-2 first couple weeks   traMADol 50 MG tablet Commonly known as: ULTRAM Take 50 mg by mouth every 6 (six) hours as needed for moderate pain.   TYLENOL 500 MG tablet Generic drug: acetaminophen Take 500 mg by mouth every 6 (six) hours as needed for moderate pain.    venlafaxine XR 37.5 MG 24 hr capsule Commonly known as: EFFEXOR-XR Take 37.5 mg by mouth daily with breakfast.   vitamin B-12 1000 MCG tablet Commonly known as: CYANOCOBALAMIN Take 1,000 mcg by mouth daily.   Vitamin D3 50 MCG (2000 UT) Tabs Take 2,000 Units by mouth daily.       Diagnostic Studies: Dg Pelvis Portable  Result Date: 09/06/2018 CLINICAL DATA:  Postop EXAM: PORTABLE PELVIS 1-2 VIEWS COMPARISON:  CT 08/29/2017 FINDINGS: Non inclusion of the upper pelvis. Calcified mass in the pelvis is presumably calcified fibroid. Pubic symphysis and rami are intact. Patient is status post left hip replacement with intact hardware and normal alignment. No fracture. Gas in the soft tissues consistent with recent surgery. IMPRESSION: Status post left hip replacement with expected postsurgical changes Electronically Signed   By: Donavan Foil M.D.   On: 09/06/2018 19:58   Dg Hip Port Unilat With Pelvis 1v Left  Result Date: 09/06/2018 CLINICAL DATA:  Postop EXAM: DG HIP (WITH OR WITHOUT PELVIS) 1V PORT LEFT COMPARISON:  None. FINDINGS: Lateral view of the hip demonstrates hip replacement with normal alignment. IMPRESSION: Left hip replacement with normal alignment Electronically Signed   By: Donavan Foil M.D.   On: 09/06/2018 19:57   Dg Femur Port Min 2 Views Left  Result Date: 09/07/2018 CLINICAL DATA:  68 year old female with hip surgery EXAM: LEFT FEMUR PORTABLE 2 VIEWS COMPARISON:  None. FINDINGS: Surgical changes of left hip arthroplasty with gas at the surgical bed. Components are congruent. No perihardware fracture. No unexpected soft tissue density or radiopaque foreign body. IMPRESSION: Early surgical changes of left hip arthroplasty. Electronically Signed   By: Corrie Mckusick D.O.   On: 09/07/2018 09:04    Disposition: Discharge disposition: 01-Home or Self Care       Discharge Instructions    Discharge patient   Complete by: As directed    Okay to Discharge IF: Cleared by  PT, pain & nausea controlled PO, ambulating safely and tolerating diet.   Discharge disposition: 01-Home or Self Care   Discharge patient date: 09/08/2018      Follow-up Information    Earlie Server, MD. Schedule an appointment as soon as possible for a visit in 2 weeks.   Specialty: Orthopedic Surgery Contact information: [address] [place] [address] [phone]        [facility] Follow up.   Specialty: [place] Why: agency will provide home health physical therapy, agency will call you to schedule initial visit Contact information: [address] [place] [address] [phone]            Signed: Prudencio Burly III PA-C 09/17/2018, 12:41 PM

## 2018-09-19 DIAGNOSIS — M1612 Unilateral primary osteoarthritis, left hip: Secondary | ICD-10-CM | POA: Diagnosis not present

## 2018-09-24 DIAGNOSIS — M1612 Unilateral primary osteoarthritis, left hip: Secondary | ICD-10-CM | POA: Diagnosis not present

## 2018-09-27 DIAGNOSIS — M1612 Unilateral primary osteoarthritis, left hip: Secondary | ICD-10-CM | POA: Diagnosis not present

## 2018-10-17 DIAGNOSIS — M1612 Unilateral primary osteoarthritis, left hip: Secondary | ICD-10-CM | POA: Diagnosis not present

## 2019-01-09 DIAGNOSIS — M1612 Unilateral primary osteoarthritis, left hip: Secondary | ICD-10-CM | POA: Diagnosis not present

## 2019-01-14 DIAGNOSIS — F4322 Adjustment disorder with anxiety: Secondary | ICD-10-CM | POA: Diagnosis not present

## 2019-01-14 DIAGNOSIS — N390 Urinary tract infection, site not specified: Secondary | ICD-10-CM | POA: Diagnosis not present

## 2019-01-20 ENCOUNTER — Ambulatory Visit: Payer: PPO | Admitting: Physical Therapy

## 2019-01-22 ENCOUNTER — Encounter: Payer: Self-pay | Admitting: Physical Therapy

## 2019-01-22 ENCOUNTER — Other Ambulatory Visit: Payer: Self-pay

## 2019-01-22 ENCOUNTER — Ambulatory Visit: Payer: PPO | Attending: Orthopedic Surgery | Admitting: Physical Therapy

## 2019-01-22 DIAGNOSIS — M25652 Stiffness of left hip, not elsewhere classified: Secondary | ICD-10-CM | POA: Insufficient documentation

## 2019-01-22 DIAGNOSIS — M25552 Pain in left hip: Secondary | ICD-10-CM | POA: Insufficient documentation

## 2019-01-22 DIAGNOSIS — R262 Difficulty in walking, not elsewhere classified: Secondary | ICD-10-CM | POA: Insufficient documentation

## 2019-01-22 NOTE — Therapy (Signed)
[place], [place], [address] Phone: [phone]   Fax:  [phone]  Physical Therapy Evaluation  Patient Details  Name: Beverly [name] MRN: [id number] Date of Birth: 06-30-1950 Referring Provider (PT): Caffery   Encounter Date: 01/22/2019  PT End of Session - 01/22/19 1558    Visit Number  1    Date for PT Re-Evaluation  03/24/19    PT Start Time  1526    PT Stop Time  1605    PT Time Calculation (min)  39 min    Activity Tolerance  Patient tolerated treatment well    Behavior During Therapy  [facility] for tasks assessed/performed       Past Medical History:  Diagnosis Date  . Cancer [facility])    Skin cancer  . Hyperlipidemia   . Hypertension     Past Surgical History:  Procedure Laterality Date  . COLONOSCOPY    . SALPINGOOPHORECTOMY    . TOTAL HIP ARTHROPLASTY Left 09/06/2018   Procedure: TOTAL HIP ARTHROPLASTY;  Surgeon: Earlie Server, MD;  Location: [facility];  Service: Orthopedics;  Laterality: Left;  . UTERINE ARTERY EMBOLIZATION      There were no vitals filed for this visit.   Subjective Assessment - 01/22/19 1528    Subjective  Patient had a left THA posterior approach on 09/06/18, she was schduled for PT here but she reports that she was in a lot of pain and cancelled that, she had a few vistis with PT at home.  She reports that she is not walking as well as she would like, She also reports that she has been having left groin pain an left hip pain and reports difficulty walking    Limitations  Lifting;Standing;Walking;House hold activities    Patient Stated Goals  walk better, be stronger, feel better about the hip and balance    Currently in Pain?  Yes    Pain Score  2     Pain Location  Groin    Pain Orientation  Left;Anterior    Pain Descriptors / Indicators  Aching    Pain Type  Acute pain    Pain Radiating Towards  denies    Pain Onset  More than a month ago    Pain  Frequency  Intermittent    Aggravating Factors   walking at time pain can be 4/10    Pain Relieving Factors  rest    Effect of Pain on Daily Activities  I want to feel better, be stronger this limits me in the way I feel, I favor the leg and take baby steps         [facility] PT Assessment - 01/22/19 0001      Assessment   Medical Diagnosis  s/p left THA    Referring Provider (PT)  Caffery    Onset Date/Surgical Date  09/06/18      Precautions   Precautions  Posterior Hip      Restrictions   Weight Bearing Restrictions  No      Balance Screen   Has the patient fallen in the past 6 months  Yes    How many times?  1    Has the patient had a decrease in activity level because of a fear of falling?   No    Is the patient reluctant to leave their home because of a fear of falling?   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    Leisure  travel and walk her dog daily      Posture/Postural Control   Postural Limitations  Anterior pelvic tilt    Posture Comments  decreased WB on LLE      Strength   Strength Assessment Site  Hip    Right/Left Hip  Left    Left Hip Flexion  3+/5    Left Hip Extension  3+/5    Left Hip External Rotation  3+/5    Left Hip Internal Rotation  3+/5    Left Hip ABduction  3+/5      Flexibility   Quadriceps  extremely tight in the left with significant pain in the anterior left thigh and the left groin      Palpation   Palpation comment  very tight in the left groin and the left thigh      Ambulation/Gait   Gait Comments  she walks with small steps, feet seem to be wider apart, has a mid foot strike, does stairs one step at a time, due to fear and c/o weakness                Objective measurements completed on examination: See  above findings.                PT Short Term Goals - 01/22/19 1603      PT SHORT TERM GOAL #1   Title  Ind with inital HEP    Time  2    Period  Weeks    Status  New        PT Long Term Goals - 01/22/19 1604      PT LONG TERM GOAL #1   Title  Patient to report decreased pain in her left hip by 50% or more with ADLs.    Time  8    Period  Weeks    Status  New      PT LONG TERM GOAL #2   Title  Patient able to ambulate with a normal gait pattern with pain <= 2/10.    Time  8    Period  Weeks    Status  New      PT LONG TERM GOAL #3   Title  Patient to demo 4+ to 5/5 BLE strength to ease ADLs.     Time  8    Period  Weeks    Status  New      PT LONG TERM GOAL #4   Title  go up and down stairs reciprocally    Time  8    Period  Weeks    Status  New             Plan - 01/22/19 1559    Clinical Impression Statement  Patient was seen here earlier in the year for back issues, she was found to have bone on bone in her left hip, she underwent a left THA posterior approach.  She reports that she did not come to outpatient PT due to significant pain, she reports that it took some time to have home PT and that she only had a few visits, reports that she really feels like she is not walking well, having pain  in the left groin and anterior hip, recent x-rays negative, she takes small shuffling steps, does stairs one at a time, she is extremely tight in the hip flexor and quad, when she walks there is no heel strike it is mid foot due to the small shuffling steps    Stability/Clinical Decision Making  Stable/Uncomplicated    Clinical Decision Making  Low    Rehab Potential  Good    PT Frequency  2x / week    PT Duration  8 weeks    PT Treatment/Interventions  ADLs/Self Care Home Management;Electrical Stimulation;Moist Heat;Ultrasound;Cryotherapy;Therapeutic exercise;Neuromuscular re-education;Patient/family education;Manual techniques;Iontophoresis 4mg /ml  Dexamethasone    PT Next Visit Plan  work on flexibility and gait as well as strength of th eleft hip    Consulted and Agree with Plan of Care  Patient       Patient will benefit from skilled therapeutic intervention in order to improve the following deficits and impairments:  Difficulty walking, Pain, Decreased activity tolerance, Decreased strength, Decreased range of motion, Impaired flexibility, Abnormal gait, Increased muscle spasms, Decreased mobility, Decreased endurance  Visit Diagnosis: Pain in left hip - Plan: PT plan of care cert/re-cert  Stiffness of left hip, not elsewhere classified - Plan: PT plan of care cert/re-cert  Difficulty in walking, not elsewhere classified - Plan: PT plan of care cert/re-cert     Problem List Patient Active Problem List   Diagnosis Date Noted  . S/P total hip arthroplasty 09/07/2018  . Degenerative joint disease of left hip 09/06/2018  . B12 deficiency 02/01/2018  . DDD (degenerative disc disease), lumbar 01/03/2018  . Hyponatremia 10/04/2017  . Thrombocytosis ([place]) 10/04/2017  . Chronic respiratory failure with hypoxia ([place]) 09/01/2017  . Dyslipidemia, goal LDL below 70 12/06/2016  . Aortic atherosclerosis ([place]) 12/06/2016  . Diverticulosis 04/11/2016  . Essential hypertension 10/03/2013  . Current smoker 10/03/2013  . Esophageal reflux 10/03/2013  . Anxiety disorder 10/03/2013  . Allergic rhinitis 10/03/2013  . Insomnia 07/17/2013    Sumner Boast., PT 01/22/2019, 4:06 PM  [place], [place], [address] Phone: [phone]   Fax:  [phone]  Name: Beverly [name] MRN: [id number] Date of Birth: [birth year]

## 2019-01-22 NOTE — Patient Instructions (Signed)
Access Code: [email or web address]  URL: [email or web address]  Date: 01/22/2019  Prepared by: Lum Babe   Exercises Supine Hip Adductor Stretch - 10 reps - 1 sets - 10 hold - 2x daily - 7x weekly Supine Quadriceps Stretch with Strap on Table - 10 reps - 1 sets - 10 hold - 2x daily - 7x weekly Supine Hip Flexor Stretch with Weight - 10 reps - 1 sets - 10 hold - 2x daily - 7x weekly

## 2019-01-29 ENCOUNTER — Other Ambulatory Visit: Payer: Self-pay

## 2019-01-29 ENCOUNTER — Ambulatory Visit: Payer: PPO | Admitting: Physical Therapy

## 2019-01-29 DIAGNOSIS — M25552 Pain in left hip: Secondary | ICD-10-CM

## 2019-01-29 DIAGNOSIS — M25652 Stiffness of left hip, not elsewhere classified: Secondary | ICD-10-CM

## 2019-01-29 NOTE — Therapy (Signed)
[place], [place], [address] Phone: [phone]   Fax:  [phone]  Physical Therapy Treatment  Patient Details  Name: Beverly [name] MRN: [id number] Date of Birth: 1950/12/10 Referring Provider (PT): Caffery   Encounter Date: 01/29/2019  PT End of Session - 01/29/19 1510    Visit Number  2    PT Start Time  1430    PT Stop Time  1508    PT Time Calculation (min)  38 min       Past Medical History:  Diagnosis Date  . Cancer [facility])    Skin cancer  . Hyperlipidemia   . Hypertension     Past Surgical History:  Procedure Laterality Date  . COLONOSCOPY    . SALPINGOOPHORECTOMY    . TOTAL HIP ARTHROPLASTY Left 09/06/2018   Procedure: TOTAL HIP ARTHROPLASTY;  Surgeon: Earlie Server, MD;  Location: [facility];  Service: Orthopedics;  Laterality: Left;  . UTERINE ARTERY EMBOLIZATION      There were no vitals filed for this visit.  Subjective Assessment - 01/29/19 1441    Subjective  "feeling a little tight in the L hip"    Currently in Pain?  No/denies                       [facility] Adult PT Treatment/Exercise - 01/29/19 0001      Ambulation/Gait   Gait Comments  2 flights of stairs, step over step      Exercises   Exercises  Knee/Hip      Knee/Hip Exercises: Aerobic   Nustep  L3 x 5 min      Knee/Hip Exercises: Standing   Hip Abduction  Stengthening;Knee straight;10 reps;Both   2.5#   Hip Extension  Stengthening;Both;10 reps;Knee straight   2.5#   Other Standing Knee Exercises  marching w/ 2.5#      Knee/Hip Exercises: Seated   Long Arc Quad  Strengthening;Both;20 reps;Weights    Long Arc Quad Weight  3 lbs.    Ball Squeeze  2 x 10               PT Short Term Goals - 01/22/19 1603      PT SHORT TERM GOAL #1   Title  Ind with inital HEP    Time  2    Period  Weeks    Status  New        PT Long Term Goals - 01/22/19 1604      PT LONG TERM  GOAL #1   Title  Patient to report decreased pain in her left hip by 50% or more with ADLs.    Time  8    Period  Weeks    Status  New      PT LONG TERM GOAL #2   Title  Patient able to ambulate with a normal gait pattern with pain <= 2/10.    Time  8    Period  Weeks    Status  New      PT LONG TERM GOAL #3   Title  Patient to demo 4+ to 5/5 BLE strength to ease ADLs.     Time  8    Period  Weeks    Status  New      PT LONG TERM GOAL #4   Title  go up and down stairs reciprocally    Time  8  Period  Weeks    Status  New            Plan - 01/29/19 1512    Clinical Impression Statement  pt tolerated session as demostrated by no increase of pain with interventions. pt able to go up and down stairs, step over step. pt able to use stairs with no handrail with min guarding. pt needs cues to alternate stairs. pt needs cues with hip extension to improve form and technique.    Stability/Clinical Decision Making  Stable/Uncomplicated    Rehab Potential  Good    PT Frequency  2x / week    PT Treatment/Interventions  ADLs/Self Care Home Management;Electrical Stimulation;Moist Heat;Ultrasound;Cryotherapy;Therapeutic exercise;Neuromuscular re-education;Patient/family education;Manual techniques;Iontophoresis 4mg /ml Dexamethasone    PT Next Visit Plan  work on flexibility and gait as well as strength of the left hip       Patient will benefit from skilled therapeutic intervention in order to improve the following deficits and impairments:  Difficulty walking, Pain, Decreased activity tolerance, Decreased strength, Decreased range of motion, Impaired flexibility, Abnormal gait, Increased muscle spasms, Decreased mobility, Decreased endurance  Visit Diagnosis: Pain in left hip  Stiffness of left hip, not elsewhere classified     Problem List Patient Active Problem List   Diagnosis Date Noted  . S/P total hip arthroplasty 09/07/2018  . Degenerative joint disease of left hip  09/06/2018  . B12 deficiency 02/01/2018  . DDD (degenerative disc disease), lumbar 01/03/2018  . Hyponatremia 10/04/2017  . Thrombocytosis ([place]) 10/04/2017  . Chronic respiratory failure with hypoxia ([place]) 09/01/2017  . Dyslipidemia, goal LDL below 70 12/06/2016  . Aortic atherosclerosis ([place]) 12/06/2016  . Diverticulosis 04/11/2016  . Essential hypertension 10/03/2013  . Current smoker 10/03/2013  . Esophageal reflux 10/03/2013  . Anxiety disorder 10/03/2013  . Allergic rhinitis 10/03/2013  . Insomnia 07/17/2013    Barrett Henle, [place] 01/29/2019, 3:14 PM  [place], [place], [address] Phone: [phone]   Fax:  [phone]  Name: Beverly [name] MRN: [id number] Date of Birth: [birth year]

## 2019-01-31 ENCOUNTER — Ambulatory Visit: Payer: PPO | Admitting: Physical Therapy

## 2019-01-31 ENCOUNTER — Other Ambulatory Visit: Payer: Self-pay

## 2019-01-31 DIAGNOSIS — M25552 Pain in left hip: Secondary | ICD-10-CM

## 2019-01-31 DIAGNOSIS — R262 Difficulty in walking, not elsewhere classified: Secondary | ICD-10-CM

## 2019-01-31 NOTE — Therapy (Signed)
[place], [place], [address] Phone: [phone]   Fax:  [phone]  Physical Therapy Treatment  Patient Details  Name: ANAHIT KLUMB MRN: [id number] Date of Birth: 1950-07-25 Referring Provider (PT): Caffery   Encounter Date: 01/31/2019  PT End of Session - 01/31/19 1139    Visit Number  3    PT Start Time  1100    PT Stop Time  1139    PT Time Calculation (min)  39 min       Past Medical History:  Diagnosis Date  . Cancer [facility])    Skin cancer  . Hyperlipidemia   . Hypertension     Past Surgical History:  Procedure Laterality Date  . COLONOSCOPY    . SALPINGOOPHORECTOMY    . TOTAL HIP ARTHROPLASTY Left 09/06/2018   Procedure: TOTAL HIP ARTHROPLASTY;  Surgeon: Earlie Server, MD;  Location: [facility];  Service: Orthopedics;  Laterality: Left;  . UTERINE ARTERY EMBOLIZATION      There were no vitals filed for this visit.  Subjective Assessment - 01/31/19 1102    Subjective  "feeling a little tight in the L hip"    Currently in Pain?  No/denies                       [facility] Adult PT Treatment/Exercise - 01/31/19 0001      Ambulation/Gait   Ambulation/Gait  Yes    Ambulation/Gait Assistance  7: Independent    Assistive device  None    Ambulation Surface  Level;Indoor    Gait Comments  2 flights of stairs, step over step      Knee/Hip Exercises: Aerobic   Nustep  L3 x 5 min      Knee/Hip Exercises: Machines for Strengthening   Cybex Knee Extension  5# 2 x 10      Knee/Hip Exercises: Standing   Hip Abduction  Stengthening;Knee straight;Both;20 reps   on airex   Hip Extension  Stengthening;Both;Knee straight;20 reps   on airex   Walking with Sports Cord  30 # way directions x 4    Other Standing Knee Exercises  marching w/ 2.5# x20 on airex      Knee/Hip Exercises: Seated   Ball Squeeze  3 x 10               PT Short Term Goals - 01/31/19 1103       PT SHORT TERM GOAL #1   Title  Ind with inital HEP    Status  Partially Met        PT Long Term Goals - 01/31/19 1103      PT LONG TERM GOAL #1   Title  Patient to report decreased pain in her left hip by 50% or more with ADLs.    Status  On-going      PT LONG TERM GOAL #2   Title  Patient able to ambulate with a normal gait pattern with pain <= 2/10.    Status  Partially Met      PT LONG TERM GOAL #3   Title  Patient to demo 4+ to 5/5 BLE strength to ease ADLs.     Status  Partially Met      PT LONG TERM GOAL #4   Title  go up and down stairs reciprocally    Status  Partially Met  Plan - 01/31/19 1140    Clinical Impression Statement  pt progressed to stnading hip exercises on airex. pt able to walk down hall with no pain. she needs cues to take bigger steps and increase heel strike. pt able to perform resisted gait in all directions. pt LOB x2 but able to correct herself. she needs cues with resisted gait to take bigger steps. pt has left quad weakness when going down stairs. pt needs heavy UE support to go up and stairs recpriocally. pt needs tactile cues to improve form of hip abduction and extension.    Stability/Clinical Decision Making  Stable/Uncomplicated    Rehab Potential  Good    PT Frequency  2x / week    PT Duration  8 weeks    PT Treatment/Interventions  ADLs/Self Care Home Management;Electrical Stimulation;Moist Heat;Ultrasound;Cryotherapy;Therapeutic exercise;Neuromuscular re-education;Patient/family education;Manual techniques;Iontophoresis 4mg/ml Dexamethasone    PT Next Visit Plan  work on flexibility and gait as well as strength of the left hip       Patient will benefit from skilled therapeutic intervention in order to improve the following deficits and impairments:  Difficulty walking, Pain, Decreased activity tolerance, Decreased strength, Decreased range of motion, Impaired flexibility, Abnormal gait, Increased muscle spasms,  Decreased mobility, Decreased endurance  Visit Diagnosis: Pain in left hip  Difficulty in walking, not elsewhere classified     Problem List Patient Active Problem List   Diagnosis Date Noted  . S/P total hip arthroplasty 09/07/2018  . Degenerative joint disease of left hip 09/06/2018  . B12 deficiency 02/01/2018  . DDD (degenerative disc disease), lumbar 01/03/2018  . Hyponatremia 10/04/2017  . Thrombocytosis ([facility]) 10/04/2017  . Chronic respiratory failure with hypoxia ([facility]) 09/01/2017  . Dyslipidemia, goal LDL below 70 12/06/2016  . Aortic atherosclerosis ([facility]) 12/06/2016  . Diverticulosis 04/11/2016  . Essential hypertension 10/03/2013  . Current smoker 10/03/2013  . Esophageal reflux 10/03/2013  . Anxiety disorder 10/03/2013  . Allergic rhinitis 10/03/2013  . Insomnia 07/17/2013    Savannah Benton, SPTA 01/31/2019, 11:45 AM  [facility] [address] [place], , [address] Phone: [phone]   Fax:  [phone]  Name: Madelaine M Tauer MRN: [id number] Date of Birth: 08/18/1950   

## 2019-01-31 NOTE — Therapy (Signed)
[name] [address] [name], [name], [address] Phone: [phone]   Fax:  [phone]  Physical Therapy Treatment  Patient Details  Name: Beverly [name] MRN: [id number] Date of Birth: 05/09/1950 Referring Provider (PT): Beverly [name]   Encounter Date: 01/31/2019  PT End of Session - 01/31/19 1139    Visit Number  3    PT Start Time  1100    PT Stop Time  1139    PT Time Calculation (min)  39 min       Past Medical History:  Diagnosis Date  . Cancer (HCC)    Skin cancer  . Hyperlipidemia   . Hypertension     Past Surgical History:  Procedure Laterality Date  . COLONOSCOPY    . SALPINGOOPHORECTOMY    . TOTAL HIP ARTHROPLASTY Left 09/06/2018   Procedure: TOTAL HIP ARTHROPLASTY;  Surgeon: [place] [name], Daniel, MD;  Location: [name];  Service: Orthopedics;  Laterality: Left;  . UTERINE ARTERY EMBOLIZATION      There were no vitals filed for this visit.  Subjective Assessment - 01/31/19 1102    Subjective  "feeling a little tight in the L hip"    Currently in Pain?  No/denies                       [name] Adult PT Treatment/Exercise - 01/31/19 0001      Ambulation/Gait   Ambulation/Gait  Yes    Ambulation/Gait Assistance  7: Independent    Assistive device  None    Ambulation Surface  Level;Indoor    Gait Comments  2 flights of stairs, step over step      Knee/Hip Exercises: Aerobic   Nustep  L3 x 5 min      Knee/Hip Exercises: Machines for Strengthening   Cybex Knee Extension  5# 2 x 10      Knee/Hip Exercises: Standing   Hip Abduction  Stengthening;Knee straight;Both;20 reps   on airex   Hip Extension  Stengthening;Both;Knee straight;20 reps   on airex   Walking with Sports Cord  30 # way directions x 4    Other Standing Knee Exercises  marching w/ 2.5# x20 on airex      Knee/Hip Exercises: Seated   Ball Squeeze  3 x 10               PT Short Term Goals - 01/31/19 1103       PT SHORT TERM GOAL #1   Title  Ind with inital HEP    Status  Partially Met        PT Long Term Goals - 01/31/19 1103      PT LONG TERM GOAL #1   Title  Patient to report decreased pain in her left hip by 50% or more with ADLs.    Status  On-going      PT LONG TERM GOAL #2   Title  Patient able to ambulate with a normal gait pattern with pain <= 2/10.    Status  Partially Met      PT LONG TERM GOAL #3   Title  Patient to demo 4+ to 5/5 BLE strength to ease ADLs.     Status  Partially Met      PT LONG TERM GOAL #4   Title  go up and down stairs reciprocally    Status  Partially Met              Plan - 01/31/19 1140    Clinical Impression Statement  pt progressed to stnading hip exercises on airex. pt able to walk down hall with no pain. she needs cues to take bigger steps and increase heel strike. pt able to perform resisted gait in all directions. pt LOB x2 but able to correct herself. she needs cues with resisted gait to take bigger steps. pt has left quad weakness when going down stairs. pt needs heavy UE support to go up and stairs recpriocally. pt needs tactile cues to improve form of hip abduction and extension.    Stability/Clinical Decision Making  Stable/Uncomplicated    Rehab Potential  Good    PT Frequency  2x / week    PT Duration  8 weeks    PT Treatment/Interventions  ADLs/Self Care Home Management;Electrical Stimulation;Moist Heat;Ultrasound;Cryotherapy;Therapeutic exercise;Neuromuscular re-education;Patient/family education;Manual techniques;Iontophoresis '4mg'$ /ml Dexamethasone    PT Next Visit Plan  work on flexibility and gait as well as strength of the left hip       Patient will benefit from skilled therapeutic intervention in order to improve the following deficits and impairments:  Difficulty walking, Pain, Decreased activity tolerance, Decreased strength, Decreased range of motion, Impaired flexibility, Abnormal gait, Increased muscle spasms,  Decreased mobility, Decreased endurance  Visit Diagnosis: Pain in left hip  Difficulty in walking, not elsewhere classified     Problem List Patient Active Problem List   Diagnosis Date Noted  . S/P total hip arthroplasty 09/07/2018  . Degenerative joint disease of left hip 09/06/2018  . B12 deficiency 02/01/2018  . DDD (degenerative disc disease), lumbar 01/03/2018  . Hyponatremia 10/04/2017  . Thrombocytosis ([name]) 10/04/2017  . Chronic respiratory failure with hypoxia ([name]) 09/01/2017  . Dyslipidemia, goal LDL below 70 12/06/2016  . Aortic atherosclerosis ([name]) 12/06/2016  . Diverticulosis 04/11/2016  . Essential hypertension 10/03/2013  . Current smoker 10/03/2013  . Esophageal reflux 10/03/2013  . Anxiety disorder 10/03/2013  . Allergic rhinitis 10/03/2013  . Insomnia 07/17/2013    [name], [place] 01/31/2019, 11:46 AM  [place], [place], [address] Phone: [phone]   Fax:  [phone]  Name: Beverly [name] MRN: [id number] Date of Birth: [birth year]

## 2019-02-03 ENCOUNTER — Other Ambulatory Visit: Payer: Self-pay

## 2019-02-03 ENCOUNTER — Ambulatory Visit: Payer: PPO | Admitting: Physical Therapy

## 2019-02-03 DIAGNOSIS — M25652 Stiffness of left hip, not elsewhere classified: Secondary | ICD-10-CM

## 2019-02-03 DIAGNOSIS — M25552 Pain in left hip: Secondary | ICD-10-CM | POA: Diagnosis not present

## 2019-02-03 NOTE — Therapy (Signed)
[place], [place], [address] Phone: [phone]   Fax:  [phone]  Physical Therapy Treatment  Patient Details  Name: Beverly [name] MRN: [id number] Date of Birth: 1950-10-13 Referring Provider (PT): Caffery   Encounter Date: 02/03/2019  PT End of Session - 02/03/19 1509    Visit Number  4    PT Start Time  1430    PT Stop Time  1510    PT Time Calculation (min)  40 min       Past Medical History:  Diagnosis Date  . Cancer [facility])    Skin cancer  . Hyperlipidemia   . Hypertension     Past Surgical History:  Procedure Laterality Date  . COLONOSCOPY    . SALPINGOOPHORECTOMY    . TOTAL HIP ARTHROPLASTY Left 09/06/2018   Procedure: TOTAL HIP ARTHROPLASTY;  Surgeon: Earlie Server, MD;  [facility];  Service: Orthopedics;  Laterality: Left;  . UTERINE ARTERY EMBOLIZATION      There were no vitals filed for this visit.  Subjective Assessment - 02/03/19 1434    Subjective  "feeling pain in the right hip when I walk"    Currently in Pain?  Yes    Pain Score  6     Pain Location  Hip    Pain Orientation  Left                       [facility] Adult PT Treatment/Exercise - 02/03/19 0001      High Level Balance   High Level Balance Activities  Side stepping;Marching forwards;Marching backwards   w/ 3#     Knee/Hip Exercises: Aerobic   Nustep  L3 x 5 min      Knee/Hip Exercises: Machines for Strengthening   Cybex Knee Extension  5# 2 x 15    Cybex Knee Flexion  20# 2 x 15    Cybex Leg Press  20# 3 x 10      Knee/Hip Exercises: Seated   Ball Squeeze  3 x 10    Abduction/Adduction   Strengthening;Both;20 reps    Abd/Adduction Limitations  red theraband                PT Short Term Goals - 02/03/19 1435      PT SHORT TERM GOAL #1   Title  Ind with inital HEP    Status  Achieved        PT Long Term Goals - 02/03/19 1435      PT LONG TERM GOAL  #1   Title  Patient to report decreased pain in her left hip by 50% or more with ADLs.    Status  On-going      PT LONG TERM GOAL #2   Title  Patient able to ambulate with a normal gait pattern with pain <= 2/10.    Status  Partially Met      PT LONG TERM GOAL #3   Title  Patient to demo 4+ to 5/5 BLE strength to ease ADLs.     Status  Partially Met      PT LONG TERM GOAL #4   Title  go up and down stairs reciprocally    Status  Partially Met            Plan - 02/03/19 1510    Clinical Impression Statement  pt able to perform forward and backwards marching and side  stepping with 3# weights. pt needs cues with side stepping to keep toes forward. pt needs cues with knee extension to improve form and technique. pt progressed to leg press. she tolerated it well and had no increase in pain. pt making making toward LE strengthening goal. she is still weak in hip flexion.    Stability/Clinical Decision Making  Stable/Uncomplicated    Rehab Potential  Good    PT Frequency  2x / week    PT Duration  8 weeks    PT Treatment/Interventions  ADLs/Self Care Home Management;Electrical Stimulation;Moist Heat;Ultrasound;Cryotherapy;Therapeutic exercise;Neuromuscular re-education;Patient/family education;Manual techniques;Iontophoresis 43m/ml Dexamethasone    PT Next Visit Plan  work on flexibility and gait as well as strength of the left hip, add in balance exercises       Patient will benefit from skilled therapeutic intervention in order to improve the following deficits and impairments:  Difficulty walking, Pain, Decreased activity tolerance, Decreased strength, Decreased range of motion, Impaired flexibility, Abnormal gait, Increased muscle spasms, Decreased mobility, Decreased endurance  Visit Diagnosis: Pain in left hip  Stiffness of left hip, not elsewhere classified     Problem List Patient Active Problem List   Diagnosis Date Noted  . S/P total hip arthroplasty 09/07/2018  .  Degenerative joint disease of left hip 09/06/2018  . B12 deficiency 02/01/2018  . DDD (degenerative disc disease), lumbar 01/03/2018  . Hyponatremia 10/04/2017  . Thrombocytosis ([facility] 10/04/2017  . Chronic respiratory failure with hypoxia ([facility] 09/01/2017  . Dyslipidemia, goal LDL below 70 12/06/2016  . Aortic atherosclerosis ([facility] 12/06/2016  . Diverticulosis 04/11/2016  . Essential hypertension 10/03/2013  . Current smoker 10/03/2013  . Esophageal reflux 10/03/2013  . Anxiety disorder 10/03/2013  . Allergic rhinitis 10/03/2013  . Insomnia 07/17/2013    SBarrett Henle SPTA 02/03/2019, 3:13 PM  [place] [address]Phone: [phone]  Fax:  [phone] Name: Beverly LASUREMRN: [id number] of Birth: 711/11/1950

## 2019-02-10 ENCOUNTER — Other Ambulatory Visit: Payer: Self-pay

## 2019-02-10 ENCOUNTER — Ambulatory Visit: Payer: PPO | Admitting: Physical Therapy

## 2019-02-10 DIAGNOSIS — M25552 Pain in left hip: Secondary | ICD-10-CM

## 2019-02-10 DIAGNOSIS — M25652 Stiffness of left hip, not elsewhere classified: Secondary | ICD-10-CM

## 2019-02-10 NOTE — Therapy (Signed)
[place], [place], [address] Phone: [phone]   Fax:  [phone]  Physical Therapy Treatment  Patient Details  Name: Beverly [name] MRN: [id number] Date of Birth: 03/21/1950 Referring Provider (PT): Caffery   Encounter Date: 02/10/2019  PT End of Session - 02/10/19 1433    Visit Number  5    PT Start Time  4665    PT Stop Time  1430    PT Time Calculation (min)  41 min       Past Medical History:  Diagnosis Date  . Cancer [facility])    Skin cancer  . Hyperlipidemia   . Hypertension     Past Surgical History:  Procedure Laterality Date  . COLONOSCOPY    . SALPINGOOPHORECTOMY    . TOTAL HIP ARTHROPLASTY Left 09/06/2018   Procedure: TOTAL HIP ARTHROPLASTY;  Surgeon: Earlie Server, MD;  [facility];  Service: Orthopedics;  Laterality: Left;  . UTERINE ARTERY EMBOLIZATION      There were no vitals filed for this visit.  Subjective Assessment - 02/10/19 1357    Subjective  "feeling okay"    Currently in Pain?  No/denies                       [facility] Adult PT Treatment/Exercise - 02/10/19 0001      Ambulation/Gait   Ambulation/Gait  Yes    Ambulation/Gait Assistance  7: Independent    Assistive device  None    Gait Pattern  Decreased hip/knee flexion - left    Ambulation Surface  Level;Indoor    Gait Comments  2 flights of stairs, step over step      Knee/Hip Exercises: Aerobic   Recumbent Bike  L1 x 2 min    Nustep  L3 x 5 min      Knee/Hip Exercises: Machines for Strengthening   Cybex Knee Extension  5# 2 x 15, LLE eccentrically x 10    Cybex Knee Flexion  20# 2 x 15      Knee/Hip Exercises: Standing   Hip Abduction  Stengthening;Both;10 reps;20 reps;Knee straight    Abduction Limitations  3# on airex    Hip Extension  Stengthening;Both;10 reps;20 reps;Knee straight    Extension Limitations  3# on airex    Step Down  Left;20 reps;Hand Hold: 2;Step  Height: 4"    Other Standing Knee Exercises  marching w/ 3# x20 on airex               PT Short Term Goals - 02/03/19 1435      PT SHORT TERM GOAL #1   Title  Ind with inital HEP    Status  Achieved        PT Long Term Goals - 02/03/19 1435      PT LONG TERM GOAL #1   Title  Patient to report decreased pain in her left hip by 50% or more with ADLs.    Status  On-going      PT LONG TERM GOAL #2   Title  Patient able to ambulate with a normal gait pattern with pain <= 2/10.    Status  Partially Met      PT LONG TERM GOAL #3   Title  Patient to demo 4+ to 5/5 BLE strength to ease ADLs.     Status  Partially Met      PT LONG TERM GOAL #4  Title  go up and down stairs reciprocally    Status  Partially Met            Plan - 02/10/19 1435    Clinical Impression Statement  pt attempted to warm up on recumbent bike. she had to stop due to pain in the left hip. pt needs tactile cues to improve form and technique with standing hip exercises. pt needs cues to increase heel strike with gait training. pt able to complete knee extension eccentrically, but needs cues to perform properly.    Stability/Clinical Decision Making  Stable/Uncomplicated    Rehab Potential  Good    PT Frequency  2x / week    PT Duration  8 weeks    PT Treatment/Interventions  ADLs/Self Care Home Management;Electrical Stimulation;Moist Heat;Ultrasound;Cryotherapy;Therapeutic exercise;Neuromuscular re-education;Patient/family education;Manual techniques;Iontophoresis 25m/ml Dexamethasone    PT Next Visit Plan  work on flexibility and gait as well as strength of the left hip, add in balance exercises       Patient will benefit from skilled therapeutic intervention in order to improve the following deficits and impairments:  Difficulty walking, Pain, Decreased activity tolerance, Decreased strength, Decreased range of motion, Impaired flexibility, Abnormal gait, Increased muscle spasms, Decreased  mobility, Decreased endurance  Visit Diagnosis: Pain in left hip  Stiffness of left hip, not elsewhere classified     Problem List Patient Active Problem List   Diagnosis Date Noted  . S/P total hip arthroplasty 09/07/2018  . Degenerative joint disease of left hip 09/06/2018  . B12 deficiency 02/01/2018  . DDD (degenerative disc disease), lumbar 01/03/2018  . Hyponatremia 10/04/2017  . Thrombocytosis ([facility] 10/04/2017  . Chronic respiratory failure with hypoxia ([facility] 09/01/2017  . Dyslipidemia, goal LDL below 70 12/06/2016  . Aortic atherosclerosis ([facility] 12/06/2016  . Diverticulosis 04/11/2016  . Essential hypertension 10/03/2013  . Current smoker 10/03/2013  . Esophageal reflux 10/03/2013  . Anxiety disorder 10/03/2013  . Allergic rhinitis 10/03/2013  . Insomnia 07/17/2013    SBarrett Henle SOuray11/30/2020, 2:38 PM  [place] [address]Phone: [phone]  Fax:  [phone] Name: Beverly YOREMRN: [id number] of Birth: 711-29-1952

## 2019-02-12 ENCOUNTER — Ambulatory Visit: Payer: PPO | Attending: Orthopedic Surgery | Admitting: Physical Therapy

## 2019-02-12 ENCOUNTER — Encounter: Payer: Self-pay | Admitting: Physical Therapy

## 2019-02-12 ENCOUNTER — Ambulatory Visit: Payer: PPO | Admitting: Physical Therapy

## 2019-02-12 ENCOUNTER — Other Ambulatory Visit: Payer: Self-pay

## 2019-02-12 DIAGNOSIS — M25652 Stiffness of left hip, not elsewhere classified: Secondary | ICD-10-CM | POA: Diagnosis not present

## 2019-02-12 DIAGNOSIS — M25552 Pain in left hip: Secondary | ICD-10-CM | POA: Insufficient documentation

## 2019-02-12 DIAGNOSIS — M545 Low back pain: Secondary | ICD-10-CM | POA: Diagnosis not present

## 2019-02-12 DIAGNOSIS — R262 Difficulty in walking, not elsewhere classified: Secondary | ICD-10-CM | POA: Diagnosis not present

## 2019-02-12 DIAGNOSIS — G8929 Other chronic pain: Secondary | ICD-10-CM | POA: Insufficient documentation

## 2019-02-12 NOTE — Therapy (Signed)
[place], [place], [address] Phone: [phone]   Fax:  [phone]  Physical Therapy Treatment  Patient Details  Name: Beverly [name] MRN: [id number] Date of Birth: 1950-05-25 Referring Provider (PT): Caffery   Encounter Date: 02/12/2019  PT End of Session - 02/12/19 1303    Visit Number  6    Date for PT Re-Evaluation  03/24/19    PT Start Time  1303    PT Stop Time  1345    PT Time Calculation (min)  42 min    Activity Tolerance  Patient tolerated treatment well    Behavior During Therapy  [facility] for tasks assessed/performed       Past Medical History:  Diagnosis Date  . Cancer [facility])    Skin cancer  . Hyperlipidemia   . Hypertension     Past Surgical History:  Procedure Laterality Date  . COLONOSCOPY    . SALPINGOOPHORECTOMY    . TOTAL HIP ARTHROPLASTY Left 09/06/2018   Procedure: TOTAL HIP ARTHROPLASTY;  Surgeon: Earlie Server, MD;  [facility];  Service: Orthopedics;  Laterality: Left;  . UTERINE ARTERY EMBOLIZATION      There were no vitals filed for this visit.  Subjective Assessment - 02/12/19 1314    Subjective  Pt reports her biggest concerns are her balance - she still has to sit for lower body dressing, and to walk correctly.    Patient Stated Goals  walk better, be stronger, feel better about the hip and balance    Currently in Pain?  No/denies         [facility] PT Assessment - 02/12/19 0001      Assessment   Medical Diagnosis  s/p left THA    Referring Provider (PT)  Caffery                   [facility] Adult PT Treatment/Exercise - 02/12/19 0001      High Level Balance   High Level Balance Activities  Other (comment)    High Level Balance Comments  2x10 runners lunge to high knee with one hand held assist, blue band around ankles, monster walks, 4/10 pain Lt groin going to the Rt, FWD/BWD steps      Knee/Hip Exercises: Machines for Strengthening    Cybex Knee Extension  15#, 2x15     Cybex Knee Flexion  20# 2 x 15    Cybex Leg Press  30# 2x15   VC for knee alignment, could go up in wt     Knee/Hip Exercises: Seated   Long Arc Quad  Strengthening;Both;2 sets;10 reps   with blue band around both ankles     Knee/Hip Exercises: Sidelying   Other Sidelying Knee/Hip Exercises  10 reps each, pilates CW/CCW circles, FWD/BWD kicks       Manual Therapy   Manual Therapy  Soft tissue mobilization    Soft tissue mobilization  STM to Lt groin, then foam rollor to inte same muscles                PT Short Term Goals - 02/03/19 1435      PT SHORT TERM GOAL #1   Title  Ind with inital HEP    Status  Achieved        PT Long Term Goals - 02/03/19 1435      PT LONG TERM GOAL #1   Title  Patient to report decreased pain in  her left hip by 50% or more with ADLs.    Status  On-going      PT LONG TERM GOAL #2   Title  Patient able to ambulate with a normal gait pattern with pain <= 2/10.    Status  Partially Met      PT LONG TERM GOAL #3   Title  Patient to demo 4+ to 5/5 BLE strength to ease ADLs.     Status  Partially Met      PT LONG TERM GOAL #4   Title  go up and down stairs reciprocally    Status  Partially Met            Plan - 02/12/19 1438    Clinical Impression [place] was challenged with higher level balance work in standing,  Did have Lt inner thigh pain with some of the exercise.  It was very tight and tender with palpation.  She would benefit from DN however she does not want that at this time.  Very motivated to improve and has great potential    Rehab Potential  Good    PT Frequency  2x / week    PT Duration  8 weeks    PT Treatment/Interventions  ADLs/Self Care Home Management;Electrical Stimulation;Moist Heat;Ultrasound;Cryotherapy;Therapeutic exercise;Neuromuscular re-education;Patient/family education;Manual techniques;Iontophoresis 63m/ml Dexamethasone    PT Next Visit Plan   functional balance and strengthening, manual work to Lt hip adductors.    Consulted and Agree with Plan of Care  Patient       Patient will benefit from skilled therapeutic intervention in order to improve the following deficits and impairments:  Difficulty walking, Pain, Decreased activity tolerance, Decreased strength, Decreased range of motion, Impaired flexibility, Abnormal gait, Increased muscle spasms, Decreased mobility, Decreased endurance  Visit Diagnosis: Pain in left hip  Stiffness of left hip, not elsewhere classified  Difficulty in walking, not elsewhere classified     Problem List Patient Active Problem List   Diagnosis Date Noted  . S/P total hip arthroplasty 09/07/2018  . Degenerative joint disease of left hip 09/06/2018  . B12 deficiency 02/01/2018  . DDD (degenerative disc disease), lumbar 01/03/2018  . Hyponatremia 10/04/2017  . Thrombocytosis ([facility] 10/04/2017  . Chronic respiratory failure with hypoxia ([facility] 09/01/2017  . Dyslipidemia, goal LDL below 70 12/06/2016  . Aortic atherosclerosis ([facility] 12/06/2016  . Diverticulosis 04/11/2016  . Essential hypertension 10/03/2013  . Current smoker 10/03/2013  . Esophageal reflux 10/03/2013  . Anxiety disorder 10/03/2013  . Allergic rhinitis 10/03/2013  . Insomnia 07/17/2013    SJeral PinchPT  02/12/2019, 2:41 PM  [place] [address]Phone: [phone]  Fax:  [phone] Name: Beverly ERBYMRN: [id number] of Birth: 707-Feb-1952

## 2019-02-17 ENCOUNTER — Other Ambulatory Visit: Payer: Self-pay

## 2019-02-17 ENCOUNTER — Ambulatory Visit: Payer: PPO | Admitting: Physical Therapy

## 2019-02-17 DIAGNOSIS — M25652 Stiffness of left hip, not elsewhere classified: Secondary | ICD-10-CM

## 2019-02-17 DIAGNOSIS — M25552 Pain in left hip: Secondary | ICD-10-CM | POA: Diagnosis not present

## 2019-02-17 NOTE — Therapy (Signed)
[place], [place], [address] Phone: [phone]   Fax:  [phone]  Physical Therapy Treatment  Patient Details  Name: Beverly [name] MRN: [id number] Date of Birth: 09-18-1950 Referring Provider (PT): Caffery   Encounter Date: 02/17/2019  PT End of Session - 02/17/19 1428    Visit Number  7    Date for PT Re-Evaluation  03/24/19    PT Start Time  1351    PT Stop Time  1430    PT Time Calculation (min)  39 min       Past Medical History:  Diagnosis Date  . Cancer [facility])    Skin cancer  . Hyperlipidemia   . Hypertension     Past Surgical History:  Procedure Laterality Date  . COLONOSCOPY    . SALPINGOOPHORECTOMY    . TOTAL HIP ARTHROPLASTY Left 09/06/2018   Procedure: TOTAL HIP ARTHROPLASTY;  Surgeon: Earlie Server, MD;  Location: [facility];  Service: Orthopedics;  Laterality: Left;  . UTERINE ARTERY EMBOLIZATION      There were no vitals filed for this visit.  Subjective Assessment - 02/17/19 1352    Subjective  "feeling okay, still having pain in the left hip"    Limitations  Lifting;Standing;Walking;House hold activities    Patient Stated Goals  walk better, be stronger, feel better about the hip and balance    Currently in Pain?  Yes    Pain Score  5     Pain Location  Hip    Pain Orientation  Left                       [facility] Adult PT Treatment/Exercise - 02/17/19 0001      Ambulation/Gait   Gait Comments  2 flights of stairs, step over step, UE support      Knee/Hip Exercises: Aerobic   Nustep  L3 x 5 min      Knee/Hip Exercises: Machines for Strengthening   Cybex Knee Extension  15#, 2x15     Cybex Knee Flexion  20# 2 x 15    Cybex Leg Press  30# 2x15      Knee/Hip Exercises: Standing   Step Down  Left;20 reps;Hand Hold: 2;Step Height: 4"    Walking with Sports Cord  30# all directions x 4               PT Short Term Goals - 02/03/19  1435      PT SHORT TERM GOAL #1   Title  Ind with inital HEP    Status  Achieved        PT Long Term Goals - 02/03/19 1435      PT LONG TERM GOAL #1   Title  Patient to report decreased pain in her left hip by 50% or more with ADLs.    Status  On-going      PT LONG TERM GOAL #2   Title  Patient able to ambulate with a normal gait pattern with pain <= 2/10.    Status  Partially Met      PT LONG TERM GOAL #3   Title  Patient to demo 4+ to 5/5 BLE strength to ease ADLs.     Status  Partially Met      PT LONG TERM GOAL #4   Title  go up and down stairs reciprocally    Status  Partially Met  Plan - 02/17/19 1430    Clinical Impression Statement  pt arrived 6 minutes late. pt had LOB x 2 with resisted gait, but was able to right herself. she has the most difficulty with side stepping. pt needs cues with step downs to improve form and technique. pt has left eccentic quad weakness when descending the stairs. she reuires UE support to go up and down the stairs.    Stability/Clinical Decision Making  Stable/Uncomplicated    Rehab Potential  Good    PT Frequency  2x / week    PT Duration  8 weeks    PT Treatment/Interventions  ADLs/Self Care Home Management;Electrical Stimulation;Moist Heat;Ultrasound;Cryotherapy;Therapeutic exercise;Neuromuscular re-education;Patient/family education;Manual techniques;Iontophoresis 81m/ml Dexamethasone    PT Next Visit Plan  functional balance and strengthening, manual work to Lt hip adductors.    Consulted and Agree with Plan of Care  Patient       Patient will benefit from skilled therapeutic intervention in order to improve the following deficits and impairments:  Difficulty walking, Pain, Decreased activity tolerance, Decreased strength, Decreased range of motion, Impaired flexibility, Abnormal gait, Increased muscle spasms, Decreased mobility, Decreased endurance  Visit Diagnosis: Pain in left hip  Stiffness of left hip, not  elsewhere classified     Problem List Patient Active Problem List   Diagnosis Date Noted  . S/P total hip arthroplasty 09/07/2018  . Degenerative joint disease of left hip 09/06/2018  . B12 deficiency 02/01/2018  . DDD (degenerative disc disease), lumbar 01/03/2018  . Hyponatremia 10/04/2017  . Thrombocytosis ([facility] 10/04/2017  . Chronic respiratory failure with hypoxia ([facility] 09/01/2017  . Dyslipidemia, goal LDL below 70 12/06/2016  . Aortic atherosclerosis ([facility] 12/06/2016  . Diverticulosis 04/11/2016  . Essential hypertension 10/03/2013  . Current smoker 10/03/2013  . Esophageal reflux 10/03/2013  . Anxiety disorder 10/03/2013  . Allergic rhinitis 10/03/2013  . Insomnia 07/17/2013    SBarrett Henle SPTA 02/17/2019, 2:39 PM  [address] [address]Phone: [phone]  Fax:  [phone] Name: BCHANIAH CISSEMRN: [id number] of Birth: [birth year]

## 2019-02-19 ENCOUNTER — Ambulatory Visit: Payer: PPO | Admitting: Physical Therapy

## 2019-02-19 ENCOUNTER — Other Ambulatory Visit: Payer: Self-pay

## 2019-02-19 DIAGNOSIS — M25552 Pain in left hip: Secondary | ICD-10-CM | POA: Diagnosis not present

## 2019-02-19 DIAGNOSIS — M25652 Stiffness of left hip, not elsewhere classified: Secondary | ICD-10-CM

## 2019-02-19 NOTE — Therapy (Signed)
[place], [place], [address] Phone: [phone]   Fax:  [phone]  Physical Therapy Treatment  Patient Details  Name: Beverly [name] DOBY MRN: [id number] Date of Birth: 1950-10-24 Referring Provider (PT): Caffery   Encounter Date: 02/19/2019  PT End of Session - 02/19/19 1431    Visit Number  8    Date for PT Re-Evaluation  03/24/19    PT Start Time  0086    PT Stop Time  1429    PT Time Calculation (min)  44 min       Past Medical History:  Diagnosis Date  . Cancer [facility])    Skin cancer  . Hyperlipidemia   . Hypertension     Past Surgical History:  Procedure Laterality Date  . COLONOSCOPY    . SALPINGOOPHORECTOMY    . TOTAL HIP ARTHROPLASTY Left 09/06/2018   Procedure: TOTAL HIP ARTHROPLASTY;  Surgeon: Earlie Server, MD;  Location[facility];  Service: Orthopedics;  Laterality: Left;  . UTERINE ARTERY EMBOLIZATION      There were no vitals filed for this visit.  Subjective Assessment - 02/19/19 1346    Subjective  "feeling okay". pt states she wants to do be better getting in the car.    Limitations  Lifting;Standing;Walking;House hold activities    Patient Stated Goals  walk better, be stronger, feel better about the hip and balance                       [facility] Adult PT Treatment/Exercise - 02/19/19 0001      Ambulation/Gait   Gait Comments  2 flights of stairs, step over step, UE support      Knee/Hip Exercises: Aerobic   Nustep  L3 x 6 min      Knee/Hip Exercises: Machines for Strengthening   Cybex Knee Extension  15#, 2 x 10    Cybex Knee Flexion  25# 3 x10    Cybex Leg Press  30# 2x15      Knee/Hip Exercises: Standing   Heel Raises  Both;20 reps;2 seconds    Lateral Step Up  Both;20 reps;Hand Hold: 1;Step Height: 4"    Forward Step Up  20 reps;Hand Hold: 0;Step Height: 4";Left      Knee/Hip Exercises: Seated   Sit to Sand  without UE support;10 reps    on airex     Manual Therapy   Manual Therapy  Soft tissue mobilization    Soft tissue mobilization  STM to Lt quad and groin, then foam rollor to inte same muscles                PT Short Term Goals - 02/03/19 1435      PT SHORT TERM GOAL #1   Title  Ind with inital HEP    Status  Achieved        PT Long Term Goals - 02/19/19 1347      PT LONG TERM GOAL #1   Title  Patient to report decreased pain in her left hip by 50% or more with ADLs.    Status  Partially Met      PT LONG TERM GOAL #2   Title  Patient able to ambulate with a normal gait pattern with pain <= 2/10.    Status  Partially Met      PT LONG TERM GOAL #3   Title  Patient to demo 4+ to 5/5  BLE strength to ease ADLs.     Status  Partially Met      PT LONG TERM GOAL #4   Title  go up and down stairs reciprocally    Status  Achieved            Plan - 02/19/19 1432    Clinical Impression Statement  pt performed forward and lateral step ups. she needs tactile cues with both to improve form and technique. pt able to perform heel raises. pt able to increase weight with knee flexion. pt is tight with STM in left quad and groin. pt able to tolerate moderate pressure with foam rolling. pt still needs UE support when using stairs.    Stability/Clinical Decision Making  Stable/Uncomplicated    Rehab Potential  Good    PT Frequency  2x / week    PT Duration  8 weeks    PT Treatment/Interventions  ADLs/Self Care Home Management;Electrical Stimulation;Moist Heat;Ultrasound;Cryotherapy;Therapeutic exercise;Neuromuscular re-education;Patient/family education;Manual techniques;Iontophoresis 39m/ml Dexamethasone    PT Next Visit Plan  functional balance and strengthening, manual work to Lt hip adductors.       Patient will benefit from skilled therapeutic intervention in order to improve the following deficits and impairments:  Difficulty walking, Pain, Decreased activity tolerance, Decreased strength,  Decreased range of motion, Impaired flexibility, Abnormal gait, Increased muscle spasms, Decreased mobility, Decreased endurance  Visit Diagnosis: Pain in left hip  Stiffness of left hip, not elsewhere classified     Problem List Patient Active Problem List   Diagnosis Date Noted  . S/P total hip arthroplasty 09/07/2018  . Degenerative joint disease of left hip 09/06/2018  . B12 deficiency 02/01/2018  . DDD (degenerative disc disease), lumbar 01/03/2018  . Hyponatremia 10/04/2017  . Thrombocytosis ([facility] [place] 10/04/2017  . Chronic respiratory failure with hypoxia ([facility] 09/01/2017  . Dyslipidemia, goal LDL below 70 12/06/2016  . Aortic atherosclerosis ([facility] 12/06/2016  . Diverticulosis 04/11/2016  . Essential hypertension 10/03/2013  . Current smoker 10/03/2013  . Esophageal reflux 10/03/2013  . Anxiety disorder 10/03/2013  . Allergic rhinitis 10/03/2013  . Insomnia 07/17/2013    SBarrett Henle SPTA 02/19/2019, 2:38 PM  [address] [address]Phone: [phone]  Fax:  [phone] Name: BLYNDEL DANCELMRN: [id number] of Birth: 7Dec 31, 1952

## 2019-02-24 ENCOUNTER — Encounter: Payer: Self-pay | Admitting: Physical Therapy

## 2019-02-24 ENCOUNTER — Ambulatory Visit: Payer: PPO | Admitting: Physical Therapy

## 2019-02-24 ENCOUNTER — Other Ambulatory Visit: Payer: Self-pay

## 2019-02-24 DIAGNOSIS — M25652 Stiffness of left hip, not elsewhere classified: Secondary | ICD-10-CM

## 2019-02-24 DIAGNOSIS — M25552 Pain in left hip: Secondary | ICD-10-CM | POA: Diagnosis not present

## 2019-02-24 DIAGNOSIS — R262 Difficulty in walking, not elsewhere classified: Secondary | ICD-10-CM

## 2019-02-24 NOTE — Therapy (Signed)
[place], [place], [address] Phone: [phone]   Fax:  [phone]  Physical Therapy Treatment  Patient Details  Name: Beverly [name] MRN: [id number] Date of Birth: 1951/01/09 Referring Provider (PT): Caffery   Encounter Date: 02/24/2019  PT End of Session - 02/24/19 1429    Visit Number  9    Date for PT Re-Evaluation  03/24/19    PT Start Time  3646    PT Stop Time  1429    PT Time Calculation (min)  44 min       Past Medical History:  Diagnosis Date  . Cancer [facility])    Skin cancer  . Hyperlipidemia   . Hypertension     Past Surgical History:  Procedure Laterality Date  . COLONOSCOPY    . SALPINGOOPHORECTOMY    . TOTAL HIP ARTHROPLASTY Left 09/06/2018   Procedure: TOTAL HIP ARTHROPLASTY;  Surgeon: Beverly Server, MD;  [facility];  Service: Orthopedics;  Laterality: Left;  . UTERINE ARTERY EMBOLIZATION      There were no vitals filed for this visit.  Subjective Assessment - 02/24/19 1347    Subjective  "feeling okay". pt states she wants to work on her balance.    Currently in Pain?  No/denies    Pain Score  0-No pain                       [facility] Adult PT Treatment/Exercise - 02/24/19 0001      High Level Balance   High Level Balance Activities  Side stepping;Backward walking;Marching forwards;Marching backwards;Figure 8 turns;Marching turns   3# weights   High Level Balance Comments  SLS ball toss, theraband pull ups      Knee/Hip Exercises: Aerobic   Elliptical  I: 10, R:2 x 2 min    Nustep  L4 x 6 min      Knee/Hip Exercises: Machines for Strengthening   Cybex Knee Extension  15#, 2 x 10    Cybex Knee Flexion  25# 3 x 15    Cybex Leg Press  30# 2x15      Manual Therapy   Manual Therapy  Soft tissue mobilization    Soft tissue mobilization  STM to Lt quad and groin, then foam rollor to inte same muscles                PT Short  Term Goals - 02/03/19 1435      PT SHORT TERM GOAL #1   Title  Ind with inital HEP    Status  Achieved        PT Long Term Goals - 02/24/19 1420      PT LONG TERM GOAL #1   Title  Patient to report decreased pain in her left hip by 50% or more with ADLs.    Status  Partially Met      PT LONG TERM GOAL #2   Title  Patient able to ambulate with a normal gait pattern with pain <= 2/10.    Status  Achieved      PT LONG TERM GOAL #3   Title  Patient to demo 4+ to 5/5 BLE strength to ease ADLs.     Status  Partially Met            Plan - 02/24/19 1430    Clinical Impression Statement  treatment focus on balance and strength. pt able to perform  marching, backward walking, and side stepping with 3# weight with CGA. pt had no LOB. pt attempted to use theraband to stimulate pulling up pants in standing. pt was unable to lift leg and maintain SL balance. pt able to maintain balance with SL ball toss with min guarding. pt has a couple TP in left lateral quad. pt needs cues with knee extension to improve form and technique.    Stability/Clinical Decision Making  Stable/Uncomplicated    Rehab Potential  Good    PT Frequency  2x / week    PT Duration  8 weeks    PT Treatment/Interventions  ADLs/Self Care Home Management;Electrical Stimulation;Moist Heat;Ultrasound;Cryotherapy;Therapeutic exercise;Neuromuscular re-education;Patient/family education;Manual techniques;Iontophoresis 52m/ml Dexamethasone    PT Next Visit Plan  functional balance and strengthening, manual work to Lt hip adductors.    Consulted and Agree with Plan of Care  Patient       Patient will benefit from skilled therapeutic intervention in order to improve the following deficits and impairments:  Difficulty walking, Pain, Decreased activity tolerance, Decreased strength, Decreased range of motion, Impaired flexibility, Abnormal gait, Increased muscle spasms, Decreased mobility, Decreased endurance  Visit  Diagnosis: Stiffness of left hip, not elsewhere classified  Difficulty in walking, not elsewhere classified     Problem List Patient Active Problem List   Diagnosis Date Noted  . S/P total hip arthroplasty 09/07/2018  . Degenerative joint disease of left hip 09/06/2018  . B12 deficiency 02/01/2018  . DDD (degenerative disc disease), lumbar 01/03/2018  . Hyponatremia 10/04/2017  . Thrombocytosis ([facility] 10/04/2017  . Chronic respiratory failure with hypoxia ([facility] 09/01/2017  . Dyslipidemia, goal LDL below 70 12/06/2016  . Aortic atherosclerosis ([facility] 12/06/2016  . Diverticulosis 04/11/2016  . Essential hypertension 10/03/2013  . Current smoker 10/03/2013  . Esophageal reflux 10/03/2013  . Anxiety disorder 10/03/2013  . Allergic rhinitis 10/03/2013  . Insomnia 07/17/2013   SBarrett Henle SPTA RScot Jun12/14/2020, 2:46 PM  [address] [address]Phone: [phone]  Fax:  [phone] Name: Beverly BELLINOMRN: [id number] of Birth: [birth year]

## 2019-03-03 ENCOUNTER — Encounter: Payer: Self-pay | Admitting: Physical Therapy

## 2019-03-03 ENCOUNTER — Other Ambulatory Visit: Payer: Self-pay

## 2019-03-03 ENCOUNTER — Ambulatory Visit: Payer: PPO | Admitting: Physical Therapy

## 2019-03-03 DIAGNOSIS — M25552 Pain in left hip: Secondary | ICD-10-CM | POA: Diagnosis not present

## 2019-03-03 DIAGNOSIS — R262 Difficulty in walking, not elsewhere classified: Secondary | ICD-10-CM

## 2019-03-03 DIAGNOSIS — M25652 Stiffness of left hip, not elsewhere classified: Secondary | ICD-10-CM

## 2019-03-03 DIAGNOSIS — G8929 Other chronic pain: Secondary | ICD-10-CM

## 2019-03-03 DIAGNOSIS — M545 Low back pain, unspecified: Secondary | ICD-10-CM

## 2019-03-03 NOTE — Therapy (Signed)
[place] [address] [place], [place], [address] Phone: [phone]   Fax:  [phone] Progress Note Reporting Period 01/22/19  to 03/03/19 for the first 10 visits  See note below for Objective Data and Assessment of Progress/Goals.      Physical Therapy Treatment  Patient Details  Name: Beverly [name] MRN: [id number] Date of Birth: 1951/02/04 Referring Provider (PT): Caffery   Encounter Date: 03/03/2019  PT End of Session - 03/03/19 1427    Visit Number  10    Date for PT Re-Evaluation  03/24/19    PT Start Time  1645    PT Stop Time  1427    PT Time Calculation (min)  1302 min    Activity Tolerance  Patient tolerated treatment well    Behavior During Therapy  [facility] for tasks assessed/performed       Past Medical History:  Diagnosis Date  . Cancer [facility])    Skin cancer  . Hyperlipidemia   . Hypertension     Past Surgical History:  Procedure Laterality Date  . COLONOSCOPY    . SALPINGOOPHORECTOMY    . TOTAL HIP ARTHROPLASTY Left 09/06/2018   Procedure: TOTAL HIP ARTHROPLASTY;  Surgeon: Earlie Server, MD;  Location: [facility];  Service: Orthopedics;  Laterality: Left;  . UTERINE ARTERY EMBOLIZATION      There were no vitals filed for this visit.  Subjective Assessment - 03/03/19 1345    Subjective  Legs hurts today "for some reason"    Limitations  Lifting;Standing;Walking;House hold activities    Currently in Pain?  Yes    Pain Score  4     Pain Location  Hip    Pain Orientation  Left                       [facility] Adult PT Treatment/Exercise - 03/03/19 0001      Knee/Hip Exercises: Aerobic   Elliptical  I 7, R:2 x 3 min   backwards was too difficuly   Nustep  L3 LE only x 5 min       Knee/Hip Exercises: Machines for Strengthening   Cybex Knee Extension  15# 2x10    Cybex Knee Flexion  35lb 2x10, LLE 15lb 2x10     Cybex Leg Press  40lb 2x10, 20lb LLE x10      Knee/Hip  Exercises: Standing   Heel Raises  Both;2 sets;15 reps;2 seconds    Forward Step Up  Left;Hand Hold: 0;15 reps;Step Height: 4"   mod cues verbal and tactile    Other Standing Knee Exercises  Hip abduction red tband 2x10 LLE      Knee/Hip Exercises: Seated   Long Arc Quad  Strengthening;Both;2 sets;10 reps    Long Arc Quad Weight  5 lbs.               PT Short Term Goals - 02/03/19 1435      PT SHORT TERM GOAL #1   Title  Ind with inital HEP    Status  Achieved        PT Long Term Goals - 02/24/19 1420      PT LONG TERM GOAL #1   Title  Patient to report decreased pain in her left hip by 50% or more with ADLs.    Status  Partially Met      PT LONG TERM GOAL #2   Title  Patient able to ambulate with  a normal gait pattern with pain <= 2/10.    Status  Achieved      PT LONG TERM GOAL #3   Title  Patient to demo 4+ to 5/5 BLE strength to ease ADLs.     Status  Partially Met            Plan - 03/03/19 1428    Clinical Impression Statement  LLE weakness noted during today session. Progressed to some SL strengthening, she was able to complete but required a lot of effort. Tactile and verbal cues needed to limit compensation with forward step ups. She was not able to complete a leg extension on machine using LLE only.    Stability/Clinical Decision Making  Stable/Uncomplicated    Rehab Potential  Good    PT Frequency  2x / week    PT Duration  8 weeks    PT Treatment/Interventions  ADLs/Self Care Home Management;Electrical Stimulation;Moist Heat;Ultrasound;Cryotherapy;Therapeutic exercise;Neuromuscular re-education;Patient/family education;Manual techniques;Iontophoresis 46m/ml Dexamethasone    PT Next Visit Plan  functional balance and strengthening, manual work to Lt hip adductors.       Patient will benefit from skilled therapeutic intervention in order to improve the following deficits and impairments:  Difficulty walking, Pain, Decreased activity tolerance,  Decreased strength, Decreased range of motion, Impaired flexibility, Abnormal gait, Increased muscle spasms, Decreased mobility, Decreased endurance  Visit Diagnosis: Stiffness of left hip, not elsewhere classified  Difficulty in walking, not elsewhere classified  Pain in left hip  Chronic bilateral low back pain, unspecified whether sciatica present     Problem List Patient Active Problem List   Diagnosis Date Noted  . S/P total hip arthroplasty 09/07/2018  . Degenerative joint disease of left hip 09/06/2018  . B12 deficiency 02/01/2018  . DDD (degenerative disc disease), lumbar 01/03/2018  . Hyponatremia 10/04/2017  . Thrombocytosis ([facility] 10/04/2017  . Chronic respiratory failure with hypoxia ([facility] 09/01/2017  . Dyslipidemia, goal LDL below 70 12/06/2016  . Aortic atherosclerosis ([facility]dmond[facility] 12/06/2016  . Diverticulosis 04/11/2016  . Essential hypertension 10/03/2013  . Current smoker 10/03/2013  . Esophageal reflux 10/03/2013  . Anxiety disorder 10/03/2013  . Allergic rhinitis 10/03/2013  . Insomnia 07/17/2013    [name] 03/03/2019, 2:30 PM  [address] [address]Phone: [phone]  Fax:  [phone] Name: Beverly PFAHLERMRN: [id number] of Birth: 704-28-1952

## 2019-03-05 ENCOUNTER — Other Ambulatory Visit: Payer: Self-pay

## 2019-03-05 ENCOUNTER — Encounter: Payer: Self-pay | Admitting: Physical Therapy

## 2019-03-05 ENCOUNTER — Ambulatory Visit: Payer: PPO | Admitting: Physical Therapy

## 2019-03-05 DIAGNOSIS — M25652 Stiffness of left hip, not elsewhere classified: Secondary | ICD-10-CM

## 2019-03-05 DIAGNOSIS — M25552 Pain in left hip: Secondary | ICD-10-CM | POA: Diagnosis not present

## 2019-03-05 DIAGNOSIS — R262 Difficulty in walking, not elsewhere classified: Secondary | ICD-10-CM

## 2019-03-05 NOTE — Therapy (Signed)
[place], [place], [address] Phone: [phone]   Fax:  [phone]  Physical Therapy Treatment  Patient Details  Name: Beverly [name] MRN: [id number] Date of Birth: 1950-11-14 Referring Provider (PT): Caffery   Encounter Date: 03/05/2019  PT End of Session - 03/05/19 1428    Visit Number  11    Date for PT Re-Evaluation  03/24/19    PT Start Time  1345    PT Stop Time  1428    PT Time Calculation (min)  43 min    Activity Tolerance  Patient tolerated treatment well    Behavior During Therapy  [facility] for tasks assessed/performed       Past Medical History:  Diagnosis Date  . Cancer [facility])    Skin cancer  . Hyperlipidemia   . Hypertension     Past Surgical History:  Procedure Laterality Date  . COLONOSCOPY    . SALPINGOOPHORECTOMY    . TOTAL HIP ARTHROPLASTY Left 09/06/2018   Procedure: TOTAL HIP ARTHROPLASTY;  Surgeon: Earlie Server, MD;  Location: [facility];  Service: Orthopedics;  Laterality: Left;  . UTERINE ARTERY EMBOLIZATION      There were no vitals filed for this visit.  Subjective Assessment - 03/05/19 1347    Subjective  "Feeling ok"    Limitations  Lifting;Standing;Walking;House hold activities    Currently in Pain?  No/denies                       [facility] Adult PT Treatment/Exercise - 03/05/19 0001      Ambulation/Gait   Stairs  Yes    Stairs Assistance  5: Supervision    Stair Management Technique  One rail Right;Alternating pattern    Number of Stairs  48    Height of Stairs  6    Gait Comments  2 flights of stairs, step over step, UE support, LLE wqeakness noted when descending stirs      Knee/Hip Exercises: Aerobic   Elliptical  I 7, R:2 x 3 min    Nustep  L3 LE only x 5 min       Knee/Hip Exercises: Machines for Strengthening   Cybex Knee Extension  5lb 2x10   some assist needed   Cybex Knee Flexion  LLE 15lb 2x10     Cybex Leg Press   40lb 2x10, 20lb LLE 2x5      Knee/Hip Exercises: Standing   Lateral Step Up  Step Height: 4";Hand Hold: 0;10 reps;Left;1 set    Other Standing Knee Exercises  Controlled descents 4 in LLe 2x10       Knee/Hip Exercises: Seated   Sit to Sand  2 sets;10 reps;without UE support   from blue chair, holding yellow ball              PT Short Term Goals - 02/03/19 1435      PT SHORT TERM GOAL #1   Title  Ind with inital HEP    Status  Achieved        PT Long Term Goals - 02/24/19 1420      PT LONG TERM GOAL #1   Title  Patient to report decreased pain in her left hip by 50% or more with ADLs.    Status  Partially Met      PT LONG TERM GOAL #2   Title  Patient able to ambulate with a normal gait pattern with pain <=  2/10.    Status  Achieved      PT LONG TERM GOAL #3   Title  Patient to demo 4+ to 5/5 BLE strength to ease ADLs.     Status  Partially Met            Plan - 03/05/19 1428    Clinical Impression Statement  Pt able to complete all of the exercises but LLE remains very weak. Visible LLE shaking when descending stairs. No report of pain. Assist needed with SL on leg press and extension. She was unable to achieve full LLE TKE with seated leg ext without assist. Tactile cues to prevent compensation with lateral step ups.    Stability/Clinical Decision Making  Stable/Uncomplicated    Rehab Potential  Good    PT Frequency  2x / week    PT Duration  8 weeks    PT Next Visit Plan  functional balance and strengthening, manual work to Lt hip adductors.       Patient will benefit from skilled therapeutic intervention in order to improve the following deficits and impairments:  Difficulty walking, Pain, Decreased activity tolerance, Decreased strength, Decreased range of motion, Impaired flexibility, Abnormal gait, Increased muscle spasms, Decreased mobility, Decreased endurance  Visit Diagnosis: Difficulty in walking, not elsewhere classified  Pain in left  hip  Stiffness of left hip, not elsewhere classified     Problem List Patient Active Problem List   Diagnosis Date Noted  . S/P total hip arthroplasty 09/07/2018  . Degenerative joint disease of left hip 09/06/2018  . B12 deficiency 02/01/2018  . DDD (degenerative disc disease), lumbar 01/03/2018  . Hyponatremia 10/04/2017  . Thrombocytosis ([place]) 10/04/2017  . Chronic respiratory failure with hypoxia ([place]) 09/01/2017  . Dyslipidemia, goal LDL below 70 12/06/2016  . Aortic atherosclerosis ([place]) 12/06/2016  . Diverticulosis 04/11/2016  . Essential hypertension 10/03/2013  . Current smoker 10/03/2013  . Esophageal reflux 10/03/2013  . Anxiety disorder 10/03/2013  . Allergic rhinitis 10/03/2013  . Insomnia 07/17/2013    Scot Jun, PTA 03/05/2019, 2:30 PM  [place], [place], [address] Phone: [phone]   Fax:  [phone]  Name: Beverly [name] MRN: [id number] Date of Birth: 1950/04/10

## 2019-03-20 ENCOUNTER — Encounter: Payer: Self-pay | Admitting: Physical Therapy

## 2019-03-20 ENCOUNTER — Ambulatory Visit: Payer: PPO | Attending: Orthopedic Surgery | Admitting: Physical Therapy

## 2019-03-20 ENCOUNTER — Other Ambulatory Visit: Payer: Self-pay

## 2019-03-20 DIAGNOSIS — M545 Low back pain, unspecified: Secondary | ICD-10-CM

## 2019-03-20 DIAGNOSIS — M25552 Pain in left hip: Secondary | ICD-10-CM | POA: Insufficient documentation

## 2019-03-20 DIAGNOSIS — M25652 Stiffness of left hip, not elsewhere classified: Secondary | ICD-10-CM | POA: Insufficient documentation

## 2019-03-20 DIAGNOSIS — G8929 Other chronic pain: Secondary | ICD-10-CM | POA: Diagnosis not present

## 2019-03-20 DIAGNOSIS — R262 Difficulty in walking, not elsewhere classified: Secondary | ICD-10-CM | POA: Diagnosis not present

## 2019-03-20 NOTE — Therapy (Signed)
[place], [place], [address] Phone: [phone]   Fax:  [phone]  Physical Therapy Treatment  Patient Details  Name: Beverly [name] MRN: [id number] Date of Birth: 1950/09/13 Referring Provider (PT): Caffery   Encounter Date: 03/20/2019  PT End of Session - 03/20/19 1422    Visit Number  12    Date for PT Re-Evaluation  03/24/19    PT Start Time  1345    PT Stop Time  1425    PT Time Calculation (min)  40 min    Activity Tolerance  Patient tolerated treatment well    Behavior During Therapy  [facility] for tasks assessed/performed       Past Medical History:  Diagnosis Date  . Cancer [facility])    Skin cancer  . Hyperlipidemia   . Hypertension     Past Surgical History:  Procedure Laterality Date  . COLONOSCOPY    . SALPINGOOPHORECTOMY    . TOTAL HIP ARTHROPLASTY Left 09/06/2018   Procedure: TOTAL HIP ARTHROPLASTY;  Surgeon: Earlie Server, MD;  Location: [facility];  Service: Orthopedics;  Laterality: Left;  . UTERINE ARTERY EMBOLIZATION      There were no vitals filed for this visit.  Subjective Assessment - 03/20/19 1349    Subjective  "I am walking better"    Currently in Pain?  No/denies                       [facility] Adult PT Treatment/Exercise - 03/20/19 0001      Knee/Hip Exercises: Aerobic   Elliptical  I 7, R:2 x 3 min    Nustep  L3 LE only x 5 min       Knee/Hip Exercises: Machines for Strengthening   Cybex Knee Extension  10lb 2x10; LLE 5lb 2x10     Cybex Knee Flexion  25lb 2x10, LLE 15lb 2x10     Cybex Leg Press  30lb 2x10,  20lb 5lb 2x5      Knee/Hip Exercises: Standing   Lateral Step Up  Step Height: 4";Hand Hold: 0;Left;2 sets    Other Standing Knee Exercises  Hip abduction & Ext  yellow tband 2x10 LLE      Knee/Hip Exercises: Seated   Sit to Sand  2 sets;10 reps;without UE support   blue chair holding yellow ball               PT Short  Term Goals - 02/03/19 1435      PT SHORT TERM GOAL #1   Title  Ind with inital HEP    Status  Achieved        PT Long Term Goals - 02/24/19 1420      PT LONG TERM GOAL #1   Title  Patient to report decreased pain in her left hip by 50% or more with ADLs.    Status  Partially Met      PT LONG TERM GOAL #2   Title  Patient able to ambulate with a normal gait pattern with pain <= 2/10.    Status  Achieved      PT LONG TERM GOAL #3   Title  Patient to demo 4+ to 5/5 BLE strength to ease ADLs.     Status  Partially Met            Plan - 03/20/19 1423    Clinical Impression Statement  Pt returns to therapy after two  weeks. She reports that she is walking better. She was able to complete today's interventions but with some visible L hip weakness. Unable to achieve full L knee TKE with Sl extensions under light load. Assist needed with SL on leg press. Tactile cues to maintain good posture with standing hip exercises.    Stability/Clinical Decision Making  Stable/Uncomplicated    Rehab Potential  Good    PT Frequency  2x / week    PT Duration  8 weeks    PT Treatment/Interventions  ADLs/Self Care Home Management;Electrical Stimulation;Moist Heat;Ultrasound;Cryotherapy;Therapeutic exercise;Neuromuscular re-education;Patient/family education;Manual techniques;Iontophoresis 70m/ml Dexamethasone    PT Next Visit Plan  functional balance and strengthening, manual work to Lt hip adductors.       Patient will benefit from skilled therapeutic intervention in order to improve the following deficits and impairments:  Difficulty walking, Pain, Decreased activity tolerance, Decreased strength, Decreased range of motion, Impaired flexibility, Abnormal gait, Increased muscle spasms, Decreased mobility, Decreased endurance  Visit Diagnosis: Pain in left hip  Stiffness of left hip, not elsewhere classified  Chronic bilateral low back pain, unspecified whether sciatica present  Difficulty in  walking, not elsewhere classified     Problem List Patient Active Problem List   Diagnosis Date Noted  . S/P total hip arthroplasty 09/07/2018  . Degenerative joint disease of left hip 09/06/2018  . B12 deficiency 02/01/2018  . DDD (degenerative disc disease), lumbar 01/03/2018  . Hyponatremia 10/04/2017  . Thrombocytosis ([place] 10/04/2017  . Chronic respiratory failure with hypoxia ([place] 09/01/2017  . Dyslipidemia, goal LDL below 70 12/06/2016  . Aortic atherosclerosis ([place] 12/06/2016  . Diverticulosis 04/11/2016  . Essential hypertension 10/03/2013  . Current smoker 10/03/2013  . Esophageal reflux 10/03/2013  . Anxiety disorder 10/03/2013  . Allergic rhinitis 10/03/2013  . Insomnia 07/17/2013    [name] 03/20/2019, 2:25 PM  [place] [address]Phone: [phone]  Fax:  [phone] Name: Beverly [name]: [id number] of Birth: [birth year]

## 2019-03-24 ENCOUNTER — Encounter: Payer: Self-pay | Admitting: Physical Therapy

## 2019-03-24 ENCOUNTER — Other Ambulatory Visit: Payer: Self-pay

## 2019-03-24 ENCOUNTER — Ambulatory Visit: Payer: PPO | Admitting: Physical Therapy

## 2019-03-24 DIAGNOSIS — M25652 Stiffness of left hip, not elsewhere classified: Secondary | ICD-10-CM

## 2019-03-24 DIAGNOSIS — M25552 Pain in left hip: Secondary | ICD-10-CM | POA: Diagnosis not present

## 2019-03-24 DIAGNOSIS — G8929 Other chronic pain: Secondary | ICD-10-CM

## 2019-03-24 NOTE — Therapy (Signed)
[place], [place], [address] Phone: [phone]   Fax:  [phone]  Physical Therapy Treatment  Patient Details  Name: Beverly [name] MRN: [id number] Date of Birth: 1950-11-26 Referring Provider (PT): Caffery   Encounter Date: 03/24/2019  PT End of Session - 03/24/19 1427    Visit Number  13    Date for PT Re-Evaluation  03/24/19    PT Start Time  1345    PT Stop Time  1427    PT Time Calculation (min)  42 min    Activity Tolerance  Patient tolerated treatment well    Behavior During Therapy  [facility] for tasks assessed/performed       Past Medical History:  Diagnosis Date  . Cancer [facility])    Skin cancer  . Hyperlipidemia   . Hypertension     Past Surgical History:  Procedure Laterality Date  . COLONOSCOPY    . SALPINGOOPHORECTOMY    . TOTAL HIP ARTHROPLASTY Left 09/06/2018   Procedure: TOTAL HIP ARTHROPLASTY;  Surgeon: Earlie Server, MD;  [facility];  Service: Orthopedics;  Laterality: Left;  . UTERINE ARTERY EMBOLIZATION      There were no vitals filed for this visit.  Subjective Assessment - 03/24/19 1348    Subjective  Pt reports  a little pain in L groin area when she walks    Currently in Pain?  No/denies         [facility] PT Assessment - 03/24/19 0001      Strength   Left Hip Flexion  4-/5    Left Hip Extension  4-/5    Left Hip External Rotation  4-/5    Left Hip Internal Rotation  3+/5    Left Hip ABduction  4/5    Left Hip ADduction  4+/5                   [facility] Adult PT Treatment/Exercise - 03/24/19 0001      Ambulation/Gait   Stairs  Yes    Stairs Assistance  5: Supervision    Stair Management Technique  One rail Right;Alternating pattern    Number of Stairs  48    Height of Stairs  6    Gait Comments  2 flights of stairs, step over step, UE support, LLE wqeakness noted when descending stirs      Knee/Hip Exercises: Aerobic   Elliptical   I 7, R:3 x 4 min    Recumbent Bike  L0 x 4 min       Knee/Hip Exercises: Standing   Lateral Step Up  Hand Hold: 0;Left;2 sets;10 reps;Step Height: 6";Hand Hold: 1    Other Standing Knee Exercises  Hip Flex 3lb LLE x20      Knee/Hip Exercises: Seated   Long Arc Quad  Strengthening;Both;2 sets;10 reps    Long Arc Quad Weight  3 lbs.               PT Short Term Goals - 02/03/19 1435      PT SHORT TERM GOAL #1   Title  Ind with inital HEP    Status  Achieved        PT Long Term Goals - 02/24/19 1420      PT LONG TERM GOAL #1   Title  Patient to report decreased pain in her left hip by 50% or more with ADLs.    Status  Partially Met  PT LONG TERM GOAL #2   Title  Patient able to ambulate with a normal gait pattern with pain <= 2/10.    Status  Achieved      PT LONG TERM GOAL #3   Title  Patient to demo 4+ to 5/5 BLE strength to ease ADLs.     Status  Partially Met            Plan - 03/24/19 1427    Clinical Impression Statement  Pt L hip is remains very weak despite a slight increase in strength. She still ambulates with a limp for the first initial steps when standing. Reinforced the importance of doing HEP at home and to negotiate steps at home with an alternating pattern. Visible L quad shaking when descending stairs.    Stability/Clinical Decision Making  Stable/Uncomplicated    Rehab Potential  Good    PT Frequency  2x / week    PT Duration  8 weeks    PT Treatment/Interventions  ADLs/Self Care Home Management;Electrical Stimulation;Moist Heat;Ultrasound;Cryotherapy;Therapeutic exercise;Neuromuscular re-education;Patient/family education;Manual techniques;Iontophoresis 66m/ml Dexamethasone    PT Next Visit Plan  functional balance and strengthening, manual work to Lt hip adductors.       Patient will benefit from skilled therapeutic intervention in order to improve the following deficits and impairments:  Difficulty walking, Pain, Decreased activity  tolerance, Decreased strength, Decreased range of motion, Impaired flexibility, Abnormal gait, Increased muscle spasms, Decreased mobility, Decreased endurance  Visit Diagnosis: Stiffness of left hip, not elsewhere classified  Pain in left hip  Chronic bilateral low back pain, unspecified whether sciatica present     Problem List Patient Active Problem List   Diagnosis Date Noted  . S/P total hip arthroplasty 09/07/2018  . Degenerative joint disease of left hip 09/06/2018  . B12 deficiency 02/01/2018  . DDD (degenerative disc disease), lumbar 01/03/2018  . Hyponatremia 10/04/2017  . Thrombocytosis ([place] 10/04/2017  . Chronic respiratory failure with hypoxia ([place] 09/01/2017  . Dyslipidemia, goal LDL below 70 12/06/2016  . Aortic atherosclerosis ([place] 12/06/2016  . Diverticulosis 04/11/2016  . Essential hypertension 10/03/2013  . Current smoker 10/03/2013  . Esophageal reflux 10/03/2013  . Anxiety disorder 10/03/2013  . Allergic rhinitis 10/03/2013  . Insomnia 07/17/2013    [name] 03/24/2019, 2:30 PM  [place] [address]Phone: [phone]  Fax:  [phone] Name: Beverly PETRUCCIMRN: [id number] of Birth: 702/22/1952

## 2019-03-26 ENCOUNTER — Other Ambulatory Visit: Payer: Self-pay

## 2019-03-26 ENCOUNTER — Ambulatory Visit: Payer: PPO | Admitting: Physical Therapy

## 2019-03-26 ENCOUNTER — Encounter: Payer: Self-pay | Admitting: Physical Therapy

## 2019-03-26 DIAGNOSIS — M25652 Stiffness of left hip, not elsewhere classified: Secondary | ICD-10-CM

## 2019-03-26 DIAGNOSIS — G8929 Other chronic pain: Secondary | ICD-10-CM

## 2019-03-26 DIAGNOSIS — R262 Difficulty in walking, not elsewhere classified: Secondary | ICD-10-CM

## 2019-03-26 DIAGNOSIS — M25552 Pain in left hip: Secondary | ICD-10-CM

## 2019-03-26 NOTE — Therapy (Signed)
[place], [place], [address] Phone: [phone]   Fax:  [phone]  Physical Therapy Treatment  Patient Details  Name: Beverly [name] MRN: [id number] Date of Birth: 1950-06-28 Referring Provider (PT): Caffery   Encounter Date: 03/26/2019  PT End of Session - 03/26/19 1351    Visit Number  14    Date for PT Re-Evaluation  03/24/19    PT Start Time  1300    PT Stop Time  1343    PT Time Calculation (min)  43 min    Activity Tolerance  Patient tolerated treatment well    Behavior During Therapy  [facility] for tasks assessed/performed       Past Medical History:  Diagnosis Date  . Cancer [facility])    Skin cancer  . Hyperlipidemia   . Hypertension     Past Surgical History:  Procedure Laterality Date  . COLONOSCOPY    . SALPINGOOPHORECTOMY    . TOTAL HIP ARTHROPLASTY Left 09/06/2018   Procedure: TOTAL HIP ARTHROPLASTY;  Surgeon: Earlie Server, MD;  [facility];  Service: Orthopedics;  Laterality: Left;  . UTERINE ARTERY EMBOLIZATION      There were no vitals filed for this visit.  Subjective Assessment - 03/26/19 1304    Subjective  "Doing good" Left leg was sore after last time    Currently in Pain?  No/denies                       [facility] Adult PT Treatment/Exercise - 03/26/19 0001      Ambulation/Gait   Stairs  Yes    Stairs Assistance  5: Supervision    Stair Management Technique  One rail Right;Alternating pattern    Number of Stairs  48    Height of Stairs  6    Gait Comments  2 flights of stairs, step over step, UE support, LLE wqeakness noted when descending stirs      Knee/Hip Exercises: Aerobic   Elliptical  I 7, R5 x 4 min    Recumbent Bike  L0 x 5 min       Knee/Hip Exercises: Standing   Lateral Step Up  Hand Hold: 0;Left;2 sets;10 reps;Step Height: 6";Hand Hold: 1      Knee/Hip Exercises: Seated   Sit to Sand  2 sets;10 reps;without UE support    LE on airex      Knee/Hip Exercises: Supine   Bridges  Both;1 set    Straight Leg Raises  Left;2 sets;10 reps;Strengthening    Straight Leg Raises Limitations  1    Other Supine Knee/Hip Exercises  hip abduction 2x10               PT Short Term Goals - 02/03/19 1435      PT SHORT TERM GOAL #1   Title  Ind with inital HEP    Status  Achieved        PT Long Term Goals - 03/26/19 1351      PT LONG TERM GOAL #1   Title  Patient to report decreased pain in her left hip by 50% or more with ADLs.    Status  Achieved      PT LONG TERM GOAL #2   Title  Patient able to ambulate with a normal gait pattern with pain <= 2/10.    Status  Achieved      PT LONG TERM GOAL #3  Title  Patient to demo 4+ to 5/5 BLE strength to ease ADLs.     Status  Partially Met      PT LONG TERM GOAL #4   Title  go up and down stairs reciprocally    Status  Achieved            Plan - 03/26/19 1352    Clinical Impression Statement  Pt continues to have visible LLE shaking when descending stairs. Some compensation noted with lateral step ups. Cues to normalize gait pattern with resisted gait. Sit to stand on Airex was difficult for pt. L hips fatigues quick with supine exercises.    Stability/Clinical Decision Making  Stable/Uncomplicated    Rehab Potential  Good    PT Frequency  2x / week    PT Treatment/Interventions  ADLs/Self Care Home Management;Electrical Stimulation;Moist Heat;Ultrasound;Cryotherapy;Therapeutic exercise;Neuromuscular re-education;Patient/family education;Manual techniques;Iontophoresis 49m/ml Dexamethasone    PT Next Visit Plan  functional balance and strengthening, manual work to Lt hip adductors.       Patient will benefit from skilled therapeutic intervention in order to improve the following deficits and impairments:  Difficulty walking, Pain, Decreased activity tolerance, Decreased strength, Decreased range of motion, Impaired flexibility, Abnormal gait,  Increased muscle spasms, Decreased mobility, Decreased endurance  Visit Diagnosis: Stiffness of left hip, not elsewhere classified  Pain in left hip  Difficulty in walking, not elsewhere classified  Chronic bilateral low back pain, unspecified whether sciatica present     Problem List Patient Active Problem List   Diagnosis Date Noted  . S/P total hip arthroplasty 09/07/2018  . Degenerative joint disease of left hip 09/06/2018  . B12 deficiency 02/01/2018  . DDD (degenerative disc disease), lumbar 01/03/2018  . Hyponatremia 10/04/2017  . Thrombocytosis ([place] 10/04/2017  . Chronic respiratory failure with hypoxia ([place] 09/01/2017  . Dyslipidemia, goal LDL below 70 12/06/2016  . Aortic atherosclerosis ([place] 12/06/2016  . Diverticulosis 04/11/2016  . Essential hypertension 10/03/2013  . Current smoker 10/03/2013  . Esophageal reflux 10/03/2013  . Anxiety disorder 10/03/2013  . Allergic rhinitis 10/03/2013  . Insomnia 07/17/2013    [name] 03/26/2019, 1:58 PM  [place] [address] [address]Phone: [phone]  Fax:  [phone] Name: Beverly LOOMERMRN: [id number] of Birth: 705/20/1952

## 2019-04-01 ENCOUNTER — Encounter: Payer: Self-pay | Admitting: Physical Therapy

## 2019-04-01 ENCOUNTER — Ambulatory Visit: Payer: PPO | Admitting: Physical Therapy

## 2019-04-01 ENCOUNTER — Other Ambulatory Visit: Payer: Self-pay

## 2019-04-01 DIAGNOSIS — M25552 Pain in left hip: Secondary | ICD-10-CM | POA: Diagnosis not present

## 2019-04-01 DIAGNOSIS — R262 Difficulty in walking, not elsewhere classified: Secondary | ICD-10-CM

## 2019-04-01 DIAGNOSIS — M25652 Stiffness of left hip, not elsewhere classified: Secondary | ICD-10-CM

## 2019-04-01 NOTE — Therapy (Signed)
[place], [place], [address] Phone: [phone]   Fax:  [phone]  Physical Therapy Treatment  Patient Details  Name: Beverly [name] MRN: [id number] Date of Birth: 06/21/1950 Referring Provider (PT): Caffery   Encounter Date: 04/01/2019  PT End of Session - 04/01/19 1425    Visit Number  15    Date for PT Re-Evaluation  03/24/19    PT Start Time  1345    PT Stop Time  1428    PT Time Calculation (min)  43 min    Activity Tolerance  Patient tolerated treatment well    Behavior During Therapy  [facility] for tasks assessed/performed       Past Medical History:  Diagnosis Date  . Cancer [facility])    Skin cancer  . Hyperlipidemia   . Hypertension     Past Surgical History:  Procedure Laterality Date  . COLONOSCOPY    . SALPINGOOPHORECTOMY    . TOTAL HIP ARTHROPLASTY Left 09/06/2018   Procedure: TOTAL HIP ARTHROPLASTY;  Surgeon: Earlie Server, MD;  Location: [facility];  Service: Orthopedics;  Laterality: Left;  . UTERINE ARTERY EMBOLIZATION      There were no vitals filed for this visit.  Subjective Assessment - 04/01/19 1351    Subjective  "Doing pretty good"    Currently in Pain?  No/denies                       [facility] Adult PT Treatment/Exercise - 04/01/19 0001      High Level Balance   High Level Balance Comments  front and side step over foam rolls       Knee/Hip Exercises: Aerobic   Nustep  L3 LE only x 5 min       Knee/Hip Exercises: Machines for Strengthening   Cybex Knee Flexion  35lb x10, LLE 15lb 2x10     Cybex Leg Press  40lb 2x10 then 5 more        Knee/Hip Exercises: Standing   Lateral Step Up  Left;1 set;10 reps;Hand Hold: 0;Step Height: 6"    Forward Step Up  Left;1 set;10 reps;Hand Hold: 0;Step Height: 4"    Walking with Sports Cord  30# side step x 3 each      Knee/Hip Exercises: Seated   Long Writer;Left;15 reps    Long  Arc Quad Weight  5 lbs.               PT Short Term Goals - 02/03/19 1435      PT SHORT TERM GOAL #1   Title  Ind with inital HEP    Status  Achieved        PT Long Term Goals - 03/26/19 1351      PT LONG TERM GOAL #1   Title  Patient to report decreased pain in her left hip by 50% or more with ADLs.    Status  Achieved      PT LONG TERM GOAL #2   Title  Patient able to ambulate with a normal gait pattern with pain <= 2/10.    Status  Achieved      PT LONG TERM GOAL #3   Title  Patient to demo 4+ to 5/5 BLE strength to ease ADLs.     Status  Partially Met      PT LONG TERM GOAL #4   Title  go up and  down stairs reciprocally    Status  Achieved            Plan - 04/01/19 1426    Clinical Impression Statement  Pt continues to have some L hip functional weakness. She reports not doing much at home to help her mobility. Again express the importance of doing her HEP and being active at home.The increase resistance on leg press was taxing on pt. Compensation needed with step up interventions no reports of pain. Cues for full ROM with LAQ and HS curls.  Cues not to circumduct LLE with step ups.    Rehab Potential  Good    PT Frequency  2x / week    PT Treatment/Interventions  ADLs/Self Care Home Management;Electrical Stimulation;Moist Heat;Ultrasound;Cryotherapy;Therapeutic exercise;Neuromuscular re-education;Patient/family education;Manual techniques;Iontophoresis 77m/ml Dexamethasone    PT Next Visit Plan  functional balance and strengthening, manual work to Lt hip adductors.       Patient will benefit from skilled therapeutic intervention in order to improve the following deficits and impairments:  Difficulty walking, Pain, Decreased activity tolerance, Decreased strength, Decreased range of motion, Impaired flexibility, Abnormal gait, Increased muscle spasms, Decreased mobility, Decreased endurance  Visit Diagnosis: Stiffness of left hip, not elsewhere  classified  Pain in left hip  Difficulty in walking, not elsewhere classified     Problem List Patient Active Problem List   Diagnosis Date Noted  . S/P total hip arthroplasty 09/07/2018  . Degenerative joint disease of left hip 09/06/2018  . B12 deficiency 02/01/2018  . DDD (degenerative disc disease), lumbar 01/03/2018  . Hyponatremia 10/04/2017  . Thrombocytosis ([facility] 10/04/2017  . Chronic respiratory failure with hypoxia ([place] 09/01/2017  . Dyslipidemia, goal LDL below 70 12/06/2016  . Aortic atherosclerosis ([facility] 12/06/2016  . Diverticulosis 04/11/2016  . Essential hypertension 10/03/2013  . Current smoker 10/03/2013  . Esophageal reflux 10/03/2013  . Anxiety disorder 10/03/2013  . Allergic rhinitis 10/03/2013  . Insomnia 07/17/2013    [name] 04/01/2019, 2:29 PM  [place] [address] [address]Phone: [phone]  Fax:  [phone] Name: BANETTA OLVERAMRN: [id number] of Birth: [birth year]

## 2019-04-03 ENCOUNTER — Encounter: Payer: PPO | Admitting: Physical Therapy

## 2019-04-07 ENCOUNTER — Encounter: Payer: PPO | Admitting: Physical Therapy

## 2019-04-09 ENCOUNTER — Encounter: Payer: Self-pay | Admitting: Physical Therapy

## 2019-04-09 ENCOUNTER — Ambulatory Visit: Payer: PPO | Admitting: Physical Therapy

## 2019-04-09 ENCOUNTER — Other Ambulatory Visit: Payer: Self-pay

## 2019-04-09 DIAGNOSIS — M25552 Pain in left hip: Secondary | ICD-10-CM

## 2019-04-09 DIAGNOSIS — M25652 Stiffness of left hip, not elsewhere classified: Secondary | ICD-10-CM

## 2019-04-09 DIAGNOSIS — M545 Low back pain, unspecified: Secondary | ICD-10-CM

## 2019-04-09 DIAGNOSIS — G8929 Other chronic pain: Secondary | ICD-10-CM

## 2019-04-09 DIAGNOSIS — R262 Difficulty in walking, not elsewhere classified: Secondary | ICD-10-CM

## 2019-04-09 NOTE — Therapy (Signed)
[place], [place], [address] Phone: [phone]   Fax:  [phone]  Physical Therapy Treatment  Patient Details  Name: Beverly [name] MRN: [id number] Date of Birth: 1950/11/12 Referring Provider (PT): Caffery   Encounter Date: 04/09/2019  PT End of Session - 04/09/19 1340    Visit Number  16    Date for PT Re-Evaluation  03/24/19    PT Start Time  1300    PT Stop Time  1343    PT Time Calculation (min)  43 min    Activity Tolerance  Patient tolerated treatment well    Behavior During Therapy  [facility] for tasks assessed/performed       Past Medical History:  Diagnosis Date  . Cancer [facility])    Skin cancer  . Hyperlipidemia   . Hypertension     Past Surgical History:  Procedure Laterality Date  . COLONOSCOPY    . SALPINGOOPHORECTOMY    . TOTAL HIP ARTHROPLASTY Left 09/06/2018   Procedure: TOTAL HIP ARTHROPLASTY;  Surgeon: Earlie Server, MD;  [facility];  Service: Orthopedics;  Laterality: Left;  . UTERINE ARTERY EMBOLIZATION      There were no vitals filed for this visit.  Subjective Assessment - 04/09/19 1303    Subjective  "Doing all right"    Currently in Pain?  No/denies                       [facility] Adult PT Treatment/Exercise - 04/09/19 0001      Ambulation/Gait   Stairs  Yes    Stairs Assistance  5: Supervision    Stair Management Technique  One rail Right;Alternating pattern    Number of Stairs  48    Height of Stairs  6    Gait Comments  2 flights of stairs, step over step, UE support, LLE wqeakness noted when descending stirs      Knee/Hip Exercises: Aerobic   Elliptical  I 7, R5 x 3 min    Recumbent Bike  L0 x 5 min       Knee/Hip Exercises: Machines for Strengthening   Cybex Leg Press  40lb 2x15      Knee/Hip Exercises: Standing   Heel Raises  Left;2 sets;10 reps;2 seconds    Lateral Step Up  Left;10 reps;Hand Hold: 0;Step Height: 4";2  sets;Step Height: 6"    Other Standing Knee Exercises  Cable pressdown 50lb 2x15    Other Standing Knee Exercises  Resisted gait in hall wiht black band all directions               PT Short Term Goals - 02/03/19 1435      PT SHORT TERM GOAL #1   Title  Ind with inital HEP    Status  Achieved        PT Long Term Goals - 03/26/19 1351      PT LONG TERM GOAL #1   Title  Patient to report decreased pain in her left hip by 50% or more with ADLs.    Status  Achieved      PT LONG TERM GOAL #2   Title  Patient able to ambulate with a normal gait pattern with pain <= 2/10.    Status  Achieved      PT LONG TERM GOAL #3   Title  Patient to demo 4+ to 5/5 BLE strength to ease ADLs.  Status  Partially Met      PT LONG TERM GOAL #4   Title  go up and down stairs reciprocally    Status  Achieved            Plan - 04/09/19 1340    Clinical Impression Statement  Pt was able to complete all of the interventions. Cues needed to push off LLE during resisted gait. L quad does shake when controlling the eccentric phase of stair negotiation. She reports not negotiating stairs at home with an alternating pattern due to habit. Some initial weakness with cable press down.    Stability/Clinical Decision Making  Stable/Uncomplicated    Rehab Potential  Good    PT Frequency  2x / week    PT Duration  8 weeks    PT Treatment/Interventions  ADLs/Self Care Home Management;Electrical Stimulation;Moist Heat;Ultrasound;Cryotherapy;Therapeutic exercise;Neuromuscular re-education;Patient/family education;Manual techniques;Iontophoresis 75m/ml Dexamethasone    PT Next Visit Plan  functional balance and strengthening, manual work to Lt hip adductors.       Patient will benefit from skilled therapeutic intervention in order to improve the following deficits and impairments:  Difficulty walking, Pain, Decreased activity tolerance, Decreased strength, Decreased range of motion, Impaired  flexibility, Abnormal gait, Increased muscle spasms, Decreased mobility, Decreased endurance  Visit Diagnosis: Pain in left hip  Stiffness of left hip, not elsewhere classified  Difficulty in walking, not elsewhere classified  Chronic bilateral low back pain, unspecified whether sciatica present     Problem List Patient Active Problem List   Diagnosis Date Noted  . S/P total hip arthroplasty 09/07/2018  . Degenerative joint disease of left hip 09/06/2018  . B12 deficiency 02/01/2018  . DDD (degenerative disc disease), lumbar 01/03/2018  . Hyponatremia 10/04/2017  . Thrombocytosis ([place] 10/04/2017  . Chronic respiratory failure with hypoxia ([place] 09/01/2017  . Dyslipidemia, goal LDL below 70 12/06/2016  . Aortic atherosclerosis ([facility] 12/06/2016  . Diverticulosis 04/11/2016  . Essential hypertension 10/03/2013  . Current smoker 10/03/2013  . Esophageal reflux 10/03/2013  . Anxiety disorder 10/03/2013  . Allergic rhinitis 10/03/2013  . Insomnia 07/17/2013    [name] 04/09/2019, 1:48 PM  [place] [address] [address]Phone: [phone]  Fax:  [phone] Name: Beverly BLANKENBECKLERMRN: [id number] of Birth: 723-Aug-1952

## 2019-04-09 NOTE — Addendum Note (Signed)
Addended by: Sumner Boast on: 04/09/2019 04:09 PM   Modules accepted: Orders

## 2019-04-16 DIAGNOSIS — L570 Actinic keratosis: Secondary | ICD-10-CM | POA: Diagnosis not present

## 2019-04-16 DIAGNOSIS — Z23 Encounter for immunization: Secondary | ICD-10-CM | POA: Diagnosis not present

## 2019-04-16 DIAGNOSIS — Z85828 Personal history of other malignant neoplasm of skin: Secondary | ICD-10-CM | POA: Diagnosis not present

## 2019-04-16 DIAGNOSIS — D225 Melanocytic nevi of trunk: Secondary | ICD-10-CM | POA: Diagnosis not present

## 2019-04-16 DIAGNOSIS — L814 Other melanin hyperpigmentation: Secondary | ICD-10-CM | POA: Diagnosis not present

## 2019-04-16 DIAGNOSIS — L821 Other seborrheic keratosis: Secondary | ICD-10-CM | POA: Diagnosis not present

## 2019-04-16 DIAGNOSIS — L578 Other skin changes due to chronic exposure to nonionizing radiation: Secondary | ICD-10-CM | POA: Diagnosis not present

## 2019-04-23 ENCOUNTER — Other Ambulatory Visit: Payer: Self-pay

## 2019-04-23 ENCOUNTER — Encounter: Payer: Self-pay | Admitting: Physical Therapy

## 2019-04-23 ENCOUNTER — Ambulatory Visit: Payer: PPO | Attending: Orthopedic Surgery | Admitting: Physical Therapy

## 2019-04-23 DIAGNOSIS — R262 Difficulty in walking, not elsewhere classified: Secondary | ICD-10-CM

## 2019-04-23 DIAGNOSIS — M545 Low back pain, unspecified: Secondary | ICD-10-CM

## 2019-04-23 DIAGNOSIS — M25652 Stiffness of left hip, not elsewhere classified: Secondary | ICD-10-CM | POA: Diagnosis not present

## 2019-04-23 DIAGNOSIS — G8929 Other chronic pain: Secondary | ICD-10-CM | POA: Diagnosis not present

## 2019-04-23 DIAGNOSIS — M25552 Pain in left hip: Secondary | ICD-10-CM | POA: Diagnosis not present

## 2019-04-23 NOTE — Therapy (Signed)
[place], [place], [address] Phone: [phone]   Fax:  [phone]  Physical Therapy Treatment  Patient Details  Name: Beverly [name] MRN: [id number] Date of Birth: February 29, 1951 Referring Provider (PT): Caffery   Encounter Date: 04/23/2019  PT End of Session - 04/23/19 1511    Visit Number  17    Date for PT Re-Evaluation  04/24/19    PT Start Time  1430    PT Stop Time  1513    PT Time Calculation (min)  43 min    Activity Tolerance  Patient tolerated treatment well    Behavior During Therapy  [facility] for tasks assessed/performed       Past Medical History:  Diagnosis Date  . Cancer [facility])    Skin cancer  . Hyperlipidemia   . Hypertension     Past Surgical History:  Procedure Laterality Date  . COLONOSCOPY    . SALPINGOOPHORECTOMY    . TOTAL HIP ARTHROPLASTY Left 09/06/2018   Procedure: TOTAL HIP ARTHROPLASTY;  Surgeon: Earlie Server, MD;  [facility]: [facility];  Service: Orthopedics;  Laterality: Left;  . UTERINE ARTERY EMBOLIZATION      There were no vitals filed for this visit.  Subjective Assessment - 04/23/19 1435    Subjective  Pt reports some achy in her L quad that started 4 days ago. She doe not know why    Patient Stated Goals  walk better, be stronger, feel better about the hip and balance    Currently in Pain?  Yes    Pain Score  3     Pain Location  Hip    Pain Orientation  Left         [facility] PT Assessment - 04/23/19 0001      Strength   Left Hip Flexion  4/5    Left Hip Extension  4/5    Left Hip External Rotation  4-/5    Left Hip Internal Rotation  4-/5    Left Hip ABduction  5/5    Left Hip ADduction  5/5                   [facility] Adult PT Treatment/Exercise - 04/23/19 0001      Knee/Hip Exercises: Aerobic   Elliptical  I 7, R5 x 3 min    Nustep  L3 LE only x 5 min       Knee/Hip Exercises: Machines for Strengthening   Cybex Knee Flexion   35lb 2x10, LLE 15lb 2x10     Cybex Leg Press  40lb 2x10, 20lb LLE 2x5       Knee/Hip Exercises: Standing   Forward Step Up  2 sets;Left;10 reps;Hand Hold: 0;Step Height: 6"    Walking with Sports Cord  40# side step x 5 each               PT Short Term Goals - 02/03/19 1435      PT SHORT TERM GOAL #1   Title  Ind with inital HEP    Status  Achieved        PT Long Term Goals - 04/23/19 1446      PT LONG TERM GOAL #1   Title  Patient to report decreased pain in her left hip by 50% or more with ADLs.    Status  Achieved      PT LONG TERM GOAL #2   Title  Patient able to  ambulate with a normal gait pattern with pain <= 2/10.    Status  Achieved      PT LONG TERM GOAL #3   Title  Patient to demo 4+ to 5/5 BLE strength to ease ADLs.     Status  Partially Met      PT LONG TERM GOAL #4   Title  go up and down stairs reciprocally    Status  Achieved            Plan - 04/23/19 1512    Clinical Impression Statement  Pt has had an increase in L hip strength, although she continues to have some functional weakness. Less compensation noted with forward step ups. Some struggle with SL on leg press but she was able to complete. Cues needed for TKE with leg extension.    Stability/Clinical Decision Making  Stable/Uncomplicated    Rehab Potential  Good    PT Frequency  2x / week    PT Duration  8 weeks    PT Treatment/Interventions  ADLs/Self Care Home Management;Electrical Stimulation;Moist Heat;Ultrasound;Cryotherapy;Therapeutic exercise;Neuromuscular re-education;Patient/family education;Manual techniques;Iontophoresis 19m/ml Dexamethasone    PT Next Visit Plan  functional balance and strengthening, manual work to Lt hip adductors.       Patient will benefit from skilled therapeutic intervention in order to improve the following deficits and impairments:  Difficulty walking, Pain, Decreased activity tolerance, Decreased strength, Decreased range of motion, Impaired  flexibility, Abnormal gait, Increased muscle spasms, Decreased mobility, Decreased endurance  Visit Diagnosis: Stiffness of left hip, not elsewhere classified  Pain in left hip  Chronic bilateral low back pain, unspecified whether sciatica present  Difficulty in walking, not elsewhere classified     Problem List Patient Active Problem List   Diagnosis Date Noted  . S/P total hip arthroplasty 09/07/2018  . Degenerative joint disease of left hip 09/06/2018  . B12 deficiency 02/01/2018  . DDD (degenerative disc disease), lumbar 01/03/2018  . Hyponatremia 10/04/2017  . Thrombocytosis ([place] 10/04/2017  . Chronic respiratory failure with hypoxia ([place] 09/01/2017  . Dyslipidemia, goal LDL below 70 12/06/2016  . Aortic atherosclerosis ([place] 12/06/2016  . Diverticulosis 04/11/2016  . Essential hypertension 10/03/2013  . Current smoker 10/03/2013  . Esophageal reflux 10/03/2013  . Anxiety disorder 10/03/2013  . Allergic rhinitis 10/03/2013  . Insomnia 07/17/2013    RScot Jun2/12/2019, 3:14 PM  [address] [address]Phone: [phone]  Fax:  [phone] Name: Beverly LARGENTMRN: [id number] of Birth: 722-Sep-1952

## 2019-04-25 ENCOUNTER — Ambulatory Visit: Payer: PPO | Admitting: Physical Therapy

## 2019-04-30 ENCOUNTER — Ambulatory Visit: Payer: PPO

## 2019-05-05 ENCOUNTER — Ambulatory Visit: Payer: PPO | Admitting: Physical Therapy

## 2019-05-05 ENCOUNTER — Encounter: Payer: Self-pay | Admitting: Physical Therapy

## 2019-05-05 ENCOUNTER — Other Ambulatory Visit: Payer: Self-pay

## 2019-05-05 DIAGNOSIS — M25652 Stiffness of left hip, not elsewhere classified: Secondary | ICD-10-CM | POA: Diagnosis not present

## 2019-05-05 DIAGNOSIS — R262 Difficulty in walking, not elsewhere classified: Secondary | ICD-10-CM

## 2019-05-05 DIAGNOSIS — M25552 Pain in left hip: Secondary | ICD-10-CM

## 2019-05-05 DIAGNOSIS — M545 Low back pain, unspecified: Secondary | ICD-10-CM

## 2019-05-05 DIAGNOSIS — G8929 Other chronic pain: Secondary | ICD-10-CM

## 2019-05-05 NOTE — Addendum Note (Signed)
Addended by: Sumner Boast on: 05/05/2019 05:49 PM   Modules accepted: Orders

## 2019-05-05 NOTE — Therapy (Signed)
[name], [place], [address] Phone: [phone]   Fax:  [phone]  Physical Therapy Treatment  Patient Details  Name: Beverly [name] MRN: [id number] Date of Birth: 27-Feb-1951 Referring Provider (PT): Caffery   Encounter Date: 05/05/2019  PT End of Session - 05/05/19 1642    Visit Number  18    Date for PT Re-Evaluation  04/24/19    PT Start Time  1600    PT Stop Time  1643    PT Time Calculation (min)  43 min    Activity Tolerance  Patient tolerated treatment well    Behavior During Therapy  [facility] for tasks assessed/performed       Past Medical History:  Diagnosis Date  . Cancer [facility])    Skin cancer  . Hyperlipidemia   . Hypertension     Past Surgical History:  Procedure Laterality Date  . COLONOSCOPY    . SALPINGOOPHORECTOMY    . TOTAL HIP ARTHROPLASTY Left 09/06/2018   Procedure: TOTAL HIP ARTHROPLASTY;  Surgeon: Earlie Server, MD;  Location[facility];  Service: Orthopedics;  Laterality: Left;  . UTERINE ARTERY EMBOLIZATION      There were no vitals filed for this visit.  Subjective Assessment - 05/05/19 1605    Subjective  "Its Good"    Currently in Pain?  No/denies         [facility] PT Assessment - 05/05/19 0001      Strength   Left Hip Flexion  4/5    Left Hip Extension  4/5    Left Hip External Rotation  4-/5    Left Hip Internal Rotation  4-/5    Left Hip ABduction  5/5    Left Hip ADduction  5/5                   [facility] Adult PT Treatment/Exercise - 05/05/19 0001      Ambulation/Gait   Stairs  Yes    Stairs Assistance  5: Supervision    Stair Management Technique  One rail Right;Alternating pattern    Number of Stairs  48    Height of Stairs  6    Gait Comments  LLE weakness descending      Knee/Hip Exercises: Aerobic   Elliptical  I 8, R4 x 4 min    Nustep  L4 LE only x 5 min       Knee/Hip Exercises: Machines for Strengthening   Cybex Knee  Extension  10lb 2x10; LLE 5lb 2x10     Cybex Knee Flexion  35lb 2x10, LLE 15lb 2x10       Knee/Hip Exercises: Standing   Walking with Sports Cord  black band in hall all directions     Other Standing Knee Exercises  resisted side step yellow band               PT Short Term Goals - 02/03/19 1435      PT SHORT TERM GOAL #1   Title  Ind with inital HEP    Status  Achieved        PT Long Term Goals - 04/23/19 1446      PT LONG TERM GOAL #1   Title  Patient to report decreased pain in her left hip by 50% or more with ADLs.    Status  Achieved      PT LONG TERM GOAL #2   Title  Patient able to ambulate  with a normal gait pattern with pain <= 2/10.    Status  Achieved      PT LONG TERM GOAL #3   Title  Patient to demo 4+ to 5/5 BLE strength to ease ADLs.     Status  Partially Met      PT LONG TERM GOAL #4   Title  go up and down stairs reciprocally    Status  Achieved            Plan - 05/05/19 1643    Clinical Impression Statement  Despite good MMT she remains functionally weak. Antalgic gait for the first few steps after standing. She does well with stair negotiation but reports going one step at a time at home because it easier. Expressed the importance of step over step pattern home. Was able to reach full TKE with machine level ext for the first few reps.    Stability/Clinical Decision Making  Stable/Uncomplicated    Rehab Potential  Good    PT Frequency  2x / week    PT Duration  8 weeks    PT Next Visit Plan  functional balance and strengthening, manual work to Lt hip adductors.       Patient will benefit from skilled therapeutic intervention in order to improve the following deficits and impairments:  Difficulty walking, Pain, Decreased activity tolerance, Decreased strength, Decreased range of motion, Impaired flexibility, Abnormal gait, Increased muscle spasms, Decreased mobility, Decreased endurance  Visit Diagnosis: Pain in left hip  Chronic  bilateral low back pain, unspecified whether sciatica present  Difficulty in walking, not elsewhere classified  Stiffness of left hip, not elsewhere classified     Problem List Patient Active Problem List   Diagnosis Date Noted  . S/P total hip arthroplasty 09/07/2018  . Degenerative joint disease of left hip 09/06/2018  . B12 deficiency 02/01/2018  . DDD (degenerative disc disease), lumbar 01/03/2018  . Hyponatremia 10/04/2017  . Thrombocytosis ([place]) 10/04/2017  . Chronic respiratory failure with hypoxia ([place]) 09/01/2017  . Dyslipidemia, goal LDL below 70 12/06/2016  . Aortic atherosclerosis ([place]) 12/06/2016  . Diverticulosis 04/11/2016  . Essential hypertension 10/03/2013  . Current smoker 10/03/2013  . Esophageal reflux 10/03/2013  . Anxiety disorder 10/03/2013  . Allergic rhinitis 10/03/2013  . Insomnia 07/17/2013    Scot Jun, PTA 05/05/2019, 4:47 PM  [place], [place], [address] Phone: [phone]   Fax:  [phone]  Name: Beverly [name] MRN: [id number] Date of Birth: 1950/08/05

## 2019-05-06 DIAGNOSIS — Z01419 Encounter for gynecological examination (general) (routine) without abnormal findings: Secondary | ICD-10-CM | POA: Diagnosis not present

## 2019-05-06 DIAGNOSIS — M816 Localized osteoporosis [Lequesne]: Secondary | ICD-10-CM | POA: Diagnosis not present

## 2019-05-06 DIAGNOSIS — Z6825 Body mass index (BMI) 25.0-25.9, adult: Secondary | ICD-10-CM | POA: Diagnosis not present

## 2019-05-06 DIAGNOSIS — R399 Unspecified symptoms and signs involving the genitourinary system: Secondary | ICD-10-CM | POA: Diagnosis not present

## 2019-05-07 ENCOUNTER — Other Ambulatory Visit: Payer: Self-pay

## 2019-05-07 ENCOUNTER — Encounter: Payer: Self-pay | Admitting: Physical Therapy

## 2019-05-07 ENCOUNTER — Ambulatory Visit: Payer: PPO | Admitting: Physical Therapy

## 2019-05-07 DIAGNOSIS — M25652 Stiffness of left hip, not elsewhere classified: Secondary | ICD-10-CM

## 2019-05-07 DIAGNOSIS — M25552 Pain in left hip: Secondary | ICD-10-CM

## 2019-05-07 DIAGNOSIS — G8929 Other chronic pain: Secondary | ICD-10-CM

## 2019-05-07 DIAGNOSIS — R262 Difficulty in walking, not elsewhere classified: Secondary | ICD-10-CM

## 2019-05-07 DIAGNOSIS — M545 Low back pain, unspecified: Secondary | ICD-10-CM

## 2019-05-07 NOTE — Therapy (Signed)
[place], [place], [address] Phone: [phone]   Fax:  [phone]  Physical Therapy Treatment  Patient Details  Name: Beverly [name] MRN: [id number] Date of Birth: [birth year] Referring Provider (PT): Caffery   Encounter Date: 05/07/2019  PT End of Session - 05/07/19 1639    Visit Number  19    PT Start Time  1600    PT Stop Time  1635    PT Time Calculation (min)  35 min    Activity Tolerance  Patient tolerated treatment well    Behavior During Therapy  [facility] for tasks assessed/performed       Past Medical History:  Diagnosis Date  . Cancer [facility])    Skin cancer  . Hyperlipidemia   . Hypertension     Past Surgical History:  Procedure Laterality Date  . COLONOSCOPY    . SALPINGOOPHORECTOMY    . TOTAL HIP ARTHROPLASTY Left 09/06/2018   Procedure: TOTAL HIP ARTHROPLASTY;  Surgeon: Earlie Server, MD;  Location: [facility];  Service: Orthopedics;  Laterality: Left;  . UTERINE ARTERY EMBOLIZATION      There were no vitals filed for this visit.  Subjective Assessment - 05/07/19 1606    Subjective  "I feel great, best Ive felt in years"    Currently in Pain?  No/denies                       [facility] Adult PT Treatment/Exercise - 05/07/19 0001      Ambulation/Gait   Stairs  Yes    Stairs Assistance  5: Supervision    Stair Management Technique  One rail Right;Alternating pattern    Number of Stairs  36    Height of Stairs  6    Gait Comments  one flight then back down outside around building up hill.      Knee/Hip Exercises: Aerobic   Nustep  L4 LE only x 5 min       Knee/Hip Exercises: Machines for Strengthening   Cybex Knee Extension  LLE 5lb 2x10     Cybex Knee Flexion  LLE 15lb 2x10       Knee/Hip Exercises: Standing   Lateral Step Up  10 reps;Hand Hold: 0;Step Height: 6";Both;1 set    Walking with Sports Cord  30# side step x 5 each                PT [name] Term Goals - 02/03/19 1435      PT [name] TERM GOAL #1   Title  Ind with inital HEP    Status  Achieved        PT Long Term Goals - 04/23/19 1446      PT LONG TERM GOAL #1   Title  Patient to report decreased pain in her left hip by 50% or more with ADLs.    Status  Achieved      PT LONG TERM GOAL #2   Title  Patient able to ambulate with a normal gait pattern with pain <= 2/10.    Status  Achieved      PT LONG TERM GOAL #3   Title  Patient to demo 4+ to 5/5 BLE strength to ease ADLs.     Status  Partially Met      PT LONG TERM GOAL #4   Title  go up and down stairs reciprocally    Status  Achieved  Plan - 05/07/19 1640    Clinical Impression Statement  Shorted treatment time requested by pt. Progressed to some outdoor uphill ambulation. She tends to shuffle her LLE with uphill gait but able to correct with cues. Cues needed for TKE with SL extensions, second set more difficulty than first due to fatigue than weakness.    Stability/Clinical Decision Making  Stable/Uncomplicated    Rehab Potential  Good    PT Frequency  2x / week    PT Duration  8 weeks    PT Treatment/Interventions  ADLs/Self Care Home Management;Electrical Stimulation;Moist Heat;Ultrasound;Cryotherapy;Therapeutic exercise;Neuromuscular re-education;Patient/family education;Manual techniques;Iontophoresis 47m/ml Dexamethasone    PT Next Visit Plan  functional balance and strengthening, manual work to Lt hip adductors.       Patient will benefit from skilled therapeutic intervention in order to improve the following deficits and impairments:  Difficulty walking, Pain, Decreased activity tolerance, Decreased strength, Decreased range of motion, Impaired flexibility, Abnormal gait, Increased muscle spasms, Decreased mobility, Decreased endurance  Visit Diagnosis: Difficulty in walking, not elsewhere classified  Stiffness of left hip, not elsewhere  classified  Chronic bilateral low back pain, unspecified whether sciatica present  Pain in left hip     Problem List Patient Active Problem List   Diagnosis Date Noted  . S/P total hip arthroplasty 09/07/2018  . Degenerative joint disease of left hip 09/06/2018  . B12 deficiency 02/01/2018  . DDD (degenerative disc disease), lumbar 01/03/2018  . Hyponatremia 10/04/2017  . Thrombocytosis ([facility] 10/04/2017  . Chronic respiratory failure with hypoxia (HD[place] 09/01/2017  . Dyslipidemia, goal LDL below 70 12/06/2016  . Aortic atherosclerosis ([facility] 12/06/2016  . Diverticulosis 04/11/2016  . Essential hypertension 10/03/2013  . Current smoker 10/03/2013  . Esophageal reflux 10/03/2013  . Anxiety disorder 10/03/2013  . Allergic rhinitis 10/03/2013  . Insomnia 07/17/2013    RScot Jun  PTA 05/07/2019, 4:46 PM  [place] [address]Phone: [phone]  Fax:  [phone] Name: BREEANNA ACRIMRN: [id number] of Birth: 731-Mar-1952

## 2019-05-12 ENCOUNTER — Encounter: Payer: Self-pay | Admitting: Physical Therapy

## 2019-05-12 ENCOUNTER — Ambulatory Visit: Payer: PPO | Attending: Orthopedic Surgery | Admitting: Physical Therapy

## 2019-05-12 ENCOUNTER — Other Ambulatory Visit: Payer: Self-pay

## 2019-05-12 DIAGNOSIS — R262 Difficulty in walking, not elsewhere classified: Secondary | ICD-10-CM | POA: Diagnosis not present

## 2019-05-12 DIAGNOSIS — M25652 Stiffness of left hip, not elsewhere classified: Secondary | ICD-10-CM | POA: Diagnosis not present

## 2019-05-12 DIAGNOSIS — G8929 Other chronic pain: Secondary | ICD-10-CM | POA: Diagnosis not present

## 2019-05-12 DIAGNOSIS — M545 Low back pain: Secondary | ICD-10-CM | POA: Diagnosis not present

## 2019-05-12 DIAGNOSIS — M25552 Pain in left hip: Secondary | ICD-10-CM | POA: Diagnosis not present

## 2019-05-12 NOTE — Therapy (Signed)
[address] [place], [place], [address] Phone: [phone]   Fax:  [phone] Progress Note Reporting Period 03/04/20 to 05/12/19 for visits 11-20  See note below for Objective Data and Assessment of Progress/Goals.      Physical Therapy Treatment  Patient Details  Name: Beverly [name] MRN: [id number] Date of Birth: 05-15-1950 Referring Provider (PT): Caffery   Encounter Date: 05/12/2019  PT End of Session - 05/12/19 1226    Visit Number  20    Date for PT Re-Evaluation  06/02/19    PT Start Time  8177    PT Stop Time  1228    PT Time Calculation (min)  43 min    Activity Tolerance  Patient tolerated treatment well    Behavior During Therapy  [facility] for tasks assessed/performed       Past Medical History:  Diagnosis Date  . Cancer [facility])    Skin cancer  . Hyperlipidemia   . Hypertension     Past Surgical History:  Procedure Laterality Date  . COLONOSCOPY    . SALPINGOOPHORECTOMY    . TOTAL HIP ARTHROPLASTY Left 09/06/2018   Procedure: TOTAL HIP ARTHROPLASTY;  Surgeon: Earlie Server, MD;  [facility];  Service: Orthopedics;  Laterality: Left;  . UTERINE ARTERY EMBOLIZATION      There were no vitals filed for this visit.  Subjective Assessment - 05/12/19 1152    Subjective  "Good, just need to wake up"    Currently in Pain?  No/denies                       [facility] Adult PT Treatment/Exercise - 05/12/19 0001      Knee/Hip Exercises: Aerobic   Recumbent Bike  L1x 4 min     Nustep  L4 LE only x 5 min       Knee/Hip Exercises: Machines for Strengthening   Cybex Knee Extension  LLE 5lb 2x10     Cybex Knee Flexion  LLE 15lb 2x10     Cybex Leg Press  50lb 2x10, 30lb LLE 2x5       Knee/Hip Exercises: Standing   Lateral Step Up  10 reps;Hand Hold: 0;Step Height: 6";Both;1 set    Other Standing Knee Exercises  Hip abd & ext red 2x10-       Knee/Hip Exercises: Seated   Sit to Sand  2 sets;10 reps   7l              PT Short Term Goals - 02/03/19 1435      PT SHORT TERM GOAL #1   Title  Ind with inital HEP    Status  Achieved        PT Long Term Goals - 04/23/19 1446      PT LONG TERM GOAL #1   Title  Patient to report decreased pain in her left hip by 50% or more with ADLs.    Status  Achieved      PT LONG TERM GOAL #2   Title  Patient able to ambulate with a normal gait pattern with pain <= 2/10.    Status  Achieved      PT LONG TERM GOAL #3   Title  Patient to demo 4+ to 5/5 BLE strength to ease ADLs.     Status  Partially Met      PT LONG TERM GOAL #4   Title  go up  and down stairs reciprocally    Status  Achieved            Plan - 05/12/19 1229    Clinical Impression Statement  Pt did well overall, she did struggle with the additional weight on leg press. Assist needed with SL using the additional weigh to get started. Cue need for TKE with extensions. Cues to lean forward with sit to stand and to not allow LE to push against chair. She still has a limp with the first few steps after standing. Continues to emphasize her to be active at home. She report shaving wrights and gym access but does got utilize them    Stability/Clinical Decision Making  Stable/Uncomplicated    Rehab Potential  Good    PT Frequency  2x / week    PT Duration  8 weeks    PT Treatment/Interventions  ADLs/Self Care Home Management;Electrical Stimulation;Moist Heat;Ultrasound;Cryotherapy;Therapeutic exercise;Neuromuscular re-education;Patient/family education;Manual techniques;Iontophoresis 65m/ml Dexamethasone    PT Next Visit Plan  functional balance and strengthening, manual work to Lt hip adductors.       Patient will benefit from skilled therapeutic intervention in order to improve the following deficits and impairments:  Difficulty walking, Pain, Decreased activity tolerance, Decreased strength, Decreased range of motion, Impaired flexibility,  Abnormal gait, Increased muscle spasms, Decreased mobility, Decreased endurance  Visit Diagnosis: Stiffness of left hip, not elsewhere classified  Chronic bilateral low back pain, unspecified whether sciatica present  Pain in left hip  Difficulty in walking, not elsewhere classified     Problem List Patient Active Problem List   Diagnosis Date Noted  . S/P total hip arthroplasty 09/07/2018  . Degenerative joint disease of left hip 09/06/2018  . B12 deficiency 02/01/2018  . DDD (degenerative disc disease), lumbar 01/03/2018  . Hyponatremia 10/04/2017  . Thrombocytosis ([facility] 10/04/2017  . Chronic respiratory failure with hypoxia ([facility] 09/01/2017  . Dyslipidemia, goal LDL below 70 12/06/2016  . Aortic atherosclerosis (HFred[place] 12/06/2016  . Diverticulosis 04/11/2016  . Essential hypertension 10/03/2013  . Current smoker 10/03/2013  . Esophageal reflux 10/03/2013  . Anxiety disorder 10/03/2013  . Allergic rhinitis 10/03/2013  . Insomnia 07/17/2013    [name] 05/12/2019, 12:33 PM  [place] [address]Phone: [phone]  Fax:  [phone] Name: Beverly POPESCUMRN: [id number] of Birth: [birth year]

## 2019-05-14 ENCOUNTER — Encounter: Payer: PPO | Admitting: Physical Therapy

## 2019-05-20 ENCOUNTER — Encounter: Payer: Self-pay | Admitting: Physical Therapy

## 2019-05-20 ENCOUNTER — Ambulatory Visit: Payer: PPO | Admitting: Physical Therapy

## 2019-05-20 ENCOUNTER — Other Ambulatory Visit: Payer: Self-pay

## 2019-05-20 DIAGNOSIS — M25652 Stiffness of left hip, not elsewhere classified: Secondary | ICD-10-CM

## 2019-05-20 DIAGNOSIS — R262 Difficulty in walking, not elsewhere classified: Secondary | ICD-10-CM

## 2019-05-20 DIAGNOSIS — G8929 Other chronic pain: Secondary | ICD-10-CM

## 2019-05-20 DIAGNOSIS — M25552 Pain in left hip: Secondary | ICD-10-CM

## 2019-05-20 NOTE — Therapy (Signed)
[place], [place], [address] Phone: [phone]   Fax:  [phone]  Physical Therapy Treatment  Patient Details  Name: Beverly [name] MRN: [id number] Date of Birth: July 06, 1950 Referring Provider (PT): Caffery   Encounter Date: 05/20/2019  PT End of Session - 05/20/19 1135    Visit Number  21    Date for PT Re-Evaluation  06/02/19    PT Start Time  1059    PT Stop Time  1135    PT Time Calculation (min)  36 min    Activity Tolerance  Patient tolerated treatment well    Behavior During Therapy  [facility] for tasks assessed/performed       Past Medical History:  Diagnosis Date  . Cancer [facility])    Skin cancer  . Hyperlipidemia   . Hypertension     Past Surgical History:  Procedure Laterality Date  . COLONOSCOPY    . SALPINGOOPHORECTOMY    . TOTAL HIP ARTHROPLASTY Left 09/06/2018   Procedure: TOTAL HIP ARTHROPLASTY;  Surgeon: Earlie Server, MD;  [facility];  Service: Orthopedics;  Laterality: Left;  . UTERINE ARTERY EMBOLIZATION      There were no vitals filed for this visit.  Subjective Assessment - 05/20/19 1101    Subjective  "Can you take it easy today, I got my second shot yesterday" Pt reports some mild dizziness and soreness in arm.    Currently in Pain?  No/denies                       [facility] Adult PT Treatment/Exercise - 05/20/19 0001      Knee/Hip Exercises: Aerobic   Recumbent Bike  6 min L0       Knee/Hip Exercises: Machines for Strengthening   Cybex Knee Extension  LLE 3x5     Cybex Knee Flexion  LLE 15lb 2x10     Cybex Leg Press  30lb 2x15      Knee/Hip Exercises: Standing   Forward Step Up  Left;10 reps;Hand Hold: 0;Step Height: 6";1 set    Other Standing Knee Exercises  Hip abd & ext red 2x10    Other Standing Knee Exercises  Controlled descents 4in LLE 2x10       Knee/Hip Exercises: Seated   Sit to Sand  2 sets;10 reps;without UE support;5  reps               PT Short Term Goals - 02/03/19 1435      PT SHORT TERM GOAL #1   Title  Ind with inital HEP    Status  Achieved        PT Long Term Goals - 04/23/19 1446      PT LONG TERM GOAL #1   Title  Patient to report decreased pain in her left hip by 50% or more with ADLs.    Status  Achieved      PT LONG TERM GOAL #2   Title  Patient able to ambulate with a normal gait pattern with pain <= 2/10.    Status  Achieved      PT LONG TERM GOAL #3   Title  Patient to demo 4+ to 5/5 BLE strength to ease ADLs.     Status  Partially Met      PT LONG TERM GOAL #4   Title  go up and down stairs reciprocally    Status  Achieved  Plan - 05/20/19 1136    Clinical Impression Statement  Increase fatigue reported today, she stated that she received  her second Covid shot yesterday. Less weight performed while on leg press. LLE weakness still present, visible quad shaking with extensions and controlled descents. Sit to stand was very taxing on pt holding weight.    Stability/Clinical Decision Making  Stable/Uncomplicated    Rehab Potential  Good    PT Frequency  2x / week    PT Duration  8 weeks    PT Treatment/Interventions  ADLs/Self Care Home Management;Electrical Stimulation;Moist Heat;Ultrasound;Cryotherapy;Therapeutic exercise;Neuromuscular re-education;Patient/family education;Manual techniques;Iontophoresis 54m/ml Dexamethasone    PT Next Visit Plan  functional balance and strengthening, manual work to Lt hip adductors. Continues to encourade at home activity       Patient will benefit from skilled therapeutic intervention in order to improve the following deficits and impairments:  Difficulty walking, Pain, Decreased activity tolerance, Decreased strength, Decreased range of motion, Impaired flexibility, Abnormal gait, Increased muscle spasms, Decreased mobility, Decreased endurance  Visit Diagnosis: Stiffness of left hip, not elsewhere  classified  Chronic bilateral low back pain, unspecified whether sciatica present  Pain in left hip  Difficulty in walking, not elsewhere classified     Problem List Patient Active Problem List   Diagnosis Date Noted  . S/P total hip arthroplasty 09/07/2018  . Degenerative joint disease of left hip 09/06/2018  . B12 deficiency 02/01/2018  . DDD (degenerative disc disease), lumbar 01/03/2018  . Hyponatremia 10/04/2017  . Thrombocytosis ([facility] 10/04/2017  . Chronic respiratory failure with hypoxia (HPi[place] 09/01/2017  . Dyslipidemia, goal LDL below 70 12/06/2016  . Aortic atherosclerosis (HL[place] 12/06/2016  . Diverticulosis 04/11/2016  . Essential hypertension 10/03/2013  . Current smoker 10/03/2013  . Esophageal reflux 10/03/2013  . Anxiety disorder 10/03/2013  . Allergic rhinitis 10/03/2013  . Insomnia 07/17/2013    [name] 05/20/2019, 11:42 AM  [address] [address]Phone: [phone]  Fax:  [phone] Name: Beverly ASHBAUGHMRN: [id number] of Birth: 708-11-1950

## 2019-05-22 ENCOUNTER — Ambulatory Visit: Payer: PPO | Admitting: Physical Therapy

## 2019-05-27 ENCOUNTER — Ambulatory Visit: Payer: PPO | Admitting: Physical Therapy

## 2019-05-27 ENCOUNTER — Other Ambulatory Visit: Payer: Self-pay

## 2019-05-27 ENCOUNTER — Encounter: Payer: Self-pay | Admitting: Physical Therapy

## 2019-05-27 DIAGNOSIS — G8929 Other chronic pain: Secondary | ICD-10-CM

## 2019-05-27 DIAGNOSIS — M25652 Stiffness of left hip, not elsewhere classified: Secondary | ICD-10-CM

## 2019-05-27 DIAGNOSIS — M25552 Pain in left hip: Secondary | ICD-10-CM

## 2019-05-27 NOTE — Therapy (Signed)
[place], [place], [address] Phone: [phone]   Fax:  [phone]  Physical Therapy Treatment  Patient Details  Name: Beverly [name] MRN: [id number] Date of Birth: 1950-05-03 Referring Provider (PT): Caffery   Encounter Date: 05/27/2019  PT End of Session - 05/27/19 1343    Visit Number  22    Date for PT Re-Evaluation  06/02/19    PT Start Time  1300    PT Stop Time  1345    PT Time Calculation (min)  45 min    Activity Tolerance  Patient tolerated treatment well    Behavior During Therapy  [facility] for tasks assessed/performed       Past Medical History:  Diagnosis Date  . Cancer [facility])    Skin cancer  . Hyperlipidemia   . Hypertension     Past Surgical History:  Procedure Laterality Date  . COLONOSCOPY    . SALPINGOOPHORECTOMY    . TOTAL HIP ARTHROPLASTY Left 09/06/2018   Procedure: TOTAL HIP ARTHROPLASTY;  Surgeon: Earlie Server, MD;  [facility];  Service: Orthopedics;  Laterality: Left;  . UTERINE ARTERY EMBOLIZATION      There were no vitals filed for this visit.  Subjective Assessment - 05/27/19 1305    Subjective  "No good" Pt reports increase low back pain that started Friday. She reports not being able to walk Sunday. She thinks it came from picking up her 50 lb dog Wednesday    Currently in Pain?  Yes    Pain Score  8    depends on when she gets up from sitting down   Pain Location  Back                       [facility] Adult PT Treatment/Exercise - 05/27/19 0001      Knee/Hip Exercises: Stretches   Passive Hamstring Stretch  Both;4 reps;10 seconds;20 seconds    Other Knee/Hip Stretches  Lower Trunk rotation 3 each 10 sec      Knee/Hip Exercises: Aerobic   Recumbent Bike  3 min L0     Nustep  L4 x 6 min       Knee/Hip Exercises: Machines for Strengthening   Cybex Knee Extension  10lb 2x10    Cybex Knee Flexion  25lb 2x15    Cybex Leg Press   30lb 2x15      Knee/Hip Exercises: Standing   Walking with Sports Cord  30# 4 way x 3 each      Knee/Hip Exercises: Supine   Bridges  Both;15 reps;Strengthening    Bridges with [employer]  Both;15 reps;Strengthening    Other Supine Knee/Hip Exercises  Bridge and march 2x10 each     Other Supine Knee/Hip Exercises  LE on [place] small bridges, K2C, small rotations                PT Short Term Goals - 02/03/19 1435      PT SHORT TERM GOAL #1   Title  Ind with inital HEP    Status  Achieved        PT Long Term Goals - 04/23/19 1446      PT LONG TERM GOAL #1   Title  Patient to report decreased pain in her left hip by 50% or more with ADLs.    Status  Achieved      PT LONG TERM GOAL #2   Title  Patient able to ambulate with a normal gait pattern with pain <= 2/10.    Status  Achieved      PT LONG TERM GOAL #3   Title  Patient to demo 4+ to 5/5 BLE strength to ease ADLs.     Status  Partially Met      PT LONG TERM GOAL #4   Title  go up and down stairs reciprocally    Status  Achieved            Plan - 05/27/19 1343    Clinical Impression Statement  Pt enters clinic reporting increase LBP. Elected to do supine interventions and some passive stretching. She reports decrease pain after supine activity. She did well with all machine exercises. Avoided contralateral stresses to lessen strain on back.    Stability/Clinical Decision Making  Stable/Uncomplicated    PT Treatment/Interventions  ADLs/Self Care Home Management;Electrical Stimulation;Moist Heat;Ultrasound;Cryotherapy;Therapeutic exercise;Neuromuscular re-education;Patient/family education;Manual techniques;Iontophoresis 19m/ml Dexamethasone    PT Next Visit Plan  functional balance and strengthening, manual work to Lt hip adductors. Continues to encourade at home activity    PT Home Exercise Plan  Added supine bridges       Patient will benefit from skilled therapeutic intervention in order to improve  the following deficits and impairments:  Difficulty walking, Pain, Decreased activity tolerance, Decreased strength, Decreased range of motion, Impaired flexibility, Abnormal gait, Increased muscle spasms, Decreased mobility, Decreased endurance  Visit Diagnosis: Pain in left hip  Chronic bilateral low back pain, unspecified whether sciatica present  Stiffness of left hip, not elsewhere classified     Problem List Patient Active Problem List   Diagnosis Date Noted  . S/P total hip arthroplasty 09/07/2018  . Degenerative joint disease of left hip 09/06/2018  . B12 deficiency 02/01/2018  . DDD (degenerative disc disease), lumbar 01/03/2018  . Hyponatremia 10/04/2017  . Thrombocytosis (HM[place] 10/04/2017  . Chronic respiratory failure with hypoxia (HWilson 09/01/2017  . Dyslipidemia, goal LDL below 70 12/06/2016  . Aortic atherosclerosis (HCircle 12/06/2016  . Diverticulosis 04/11/2016  . Essential hypertension 10/03/2013  . Current smoker 10/03/2013  . Esophageal reflux 10/03/2013  . Anxiety disorder 10/03/2013  . Allergic rhinitis 10/03/2013  . Insomnia 07/17/2013    [name] 05/27/2019, 1:46 PM  [place] [address]Phone: [phone]  Fax:  [phone] Name: Beverly ENSZMRN: [id number] of Birth: 707-Mar-1952

## 2019-06-02 ENCOUNTER — Encounter: Payer: Self-pay | Admitting: Physical Therapy

## 2019-06-02 ENCOUNTER — Other Ambulatory Visit: Payer: Self-pay

## 2019-06-02 ENCOUNTER — Ambulatory Visit: Payer: PPO | Admitting: Physical Therapy

## 2019-06-02 DIAGNOSIS — M25552 Pain in left hip: Secondary | ICD-10-CM

## 2019-06-02 DIAGNOSIS — G8929 Other chronic pain: Secondary | ICD-10-CM

## 2019-06-02 DIAGNOSIS — M25652 Stiffness of left hip, not elsewhere classified: Secondary | ICD-10-CM

## 2019-06-02 DIAGNOSIS — R262 Difficulty in walking, not elsewhere classified: Secondary | ICD-10-CM

## 2019-06-02 NOTE — Therapy (Signed)
[place], [place], [address] Phone: [phone]   Fax:  [phone]  Physical Therapy Treatment  Patient Details  Name: Beverly [name] MRN: [id number] Date of Birth: 17-May-1950 Referring Provider (PT): Caffery   Encounter Date: 06/02/2019  PT End of Session - 06/02/19 1556    Visit Number  23    Date for PT Re-Evaluation  06/02/19    PT Start Time  6962    PT Stop Time  1558    PT Time Calculation (min)  43 min       Past Medical History:  Diagnosis Date  . Cancer [facility])    Skin cancer  . Hyperlipidemia   . Hypertension     Past Surgical History:  Procedure Laterality Date  . COLONOSCOPY    . SALPINGOOPHORECTOMY    . TOTAL HIP ARTHROPLASTY Left 09/06/2018   Procedure: TOTAL HIP ARTHROPLASTY;  Surgeon: Earlie Server, MD;  [facility];  Service: Orthopedics;  Laterality: Left;  . UTERINE ARTERY EMBOLIZATION      There were no vitals filed for this visit.  Subjective Assessment - 06/02/19 1519    Subjective  "My back is so much better" Everything is good    Currently in Pain?  No/denies                       [facility] Adult PT Treatment/Exercise - 06/02/19 0001      Knee/Hip Exercises: Aerobic   Nustep  L4 x 7 min LE only       Knee/Hip Exercises: Machines for Strengthening   Cybex Knee Extension  10lb 2x15    Cybex Knee Flexion  25lb 2x15, LLE 15lb 2x10    Cybex Leg Press  30lb 2x15      Knee/Hip Exercises: Standing   Forward Step Up  Left;10 reps;Hand Hold: 0;Step Height: 6";2 sets    Walking with Sports Cord  30# 4 way x 3 each               PT Short Term Goals - 02/03/19 1435      PT SHORT TERM GOAL #1   Title  Ind with inital HEP    Status  Achieved        PT Long Term Goals - 04/23/19 1446      PT LONG TERM GOAL #1   Title  Patient to report decreased pain in her left hip by 50% or more with ADLs.    Status  Achieved      PT  LONG TERM GOAL #2   Title  Patient able to ambulate with a normal gait pattern with pain <= 2/10.    Status  Achieved      PT LONG TERM GOAL #3   Title  Patient to demo 4+ to 5/5 BLE strength to ease ADLs.     Status  Partially Met      PT LONG TERM GOAL #4   Title  go up and down stairs reciprocally    Status  Achieved            Plan - 06/02/19 1556    Clinical Impression Statement  Pt is progressing well with her mobility.ess limping with gait. Less time needed to start gait once standing. She reports that her back feel alit better. She did well with all exercises interventions. D/C next visit.    Stability/Clinical Decision Making  Stable/Uncomplicated  Rehab Potential  Good    PT Frequency  2x / week    PT Duration  8 weeks    PT Treatment/Interventions  ADLs/Self Care Home Management;Electrical Stimulation;Moist Heat;Ultrasound;Cryotherapy;Therapeutic exercise;Neuromuscular re-education;Patient/family education;Manual techniques;Iontophoresis 2m/ml Dexamethasone    PT Next Visit Plan  D/C PT next visit       Patient will benefit from skilled therapeutic intervention in order to improve the following deficits and impairments:  Difficulty walking, Pain, Decreased activity tolerance, Decreased strength, Decreased range of motion, Impaired flexibility, Abnormal gait, Increased muscle spasms, Decreased mobility, Decreased endurance  Visit Diagnosis: Pain in left hip  Chronic bilateral low back pain, unspecified whether sciatica present  Stiffness of left hip, not elsewhere classified  Difficulty in walking, not elsewhere classified     Problem List Patient Active Problem List   Diagnosis Date Noted  . S/P total hip arthroplasty 09/07/2018  . Degenerative joint disease of left hip 09/06/2018  . B12 deficiency 02/01/2018  . DDD (degenerative disc disease), lumbar 01/03/2018  . Hyponatremia 10/04/2017  . Thrombocytosis (HC[place] 10/04/2017  . Chronic respiratory  failure with hypoxia (HH[place] 09/01/2017  . Dyslipidemia, goal LDL below 70 12/06/2016  . Aortic atherosclerosis (HDonip[place] 12/06/2016  . Diverticulosis 04/11/2016  . Essential hypertension 10/03/2013  . Current smoker 10/03/2013  . Esophageal reflux 10/03/2013  . Anxiety disorder 10/03/2013  . Allergic rhinitis 10/03/2013  . Insomnia 07/17/2013    [name] 06/02/2019, 3:58 PM  [place] [address]Phone: [phone]  Fax:  [phone] Name: Beverly TRAWICKMRN: [id number] of Birth: 7May 07, 1952

## 2019-06-05 ENCOUNTER — Encounter: Payer: Self-pay | Admitting: Physical Therapy

## 2019-06-05 ENCOUNTER — Other Ambulatory Visit: Payer: Self-pay

## 2019-06-05 ENCOUNTER — Ambulatory Visit: Payer: PPO | Admitting: Physical Therapy

## 2019-06-05 DIAGNOSIS — M25552 Pain in left hip: Secondary | ICD-10-CM

## 2019-06-05 DIAGNOSIS — R262 Difficulty in walking, not elsewhere classified: Secondary | ICD-10-CM

## 2019-06-05 DIAGNOSIS — M25652 Stiffness of left hip, not elsewhere classified: Secondary | ICD-10-CM

## 2019-06-05 DIAGNOSIS — G8929 Other chronic pain: Secondary | ICD-10-CM

## 2019-06-05 NOTE — Therapy (Signed)
[place], [place], [address] Phone: [phone]   Fax:  [phone]  Physical Therapy Treatment  Patient Details  Name: Beverly [name] MRN: [id number] Date of Birth: [birth year] Referring Provider (PT): Caffery   Encounter Date: 06/05/2019  PT End of Session - 06/05/19 1504    Visit Number  24    Date for PT Re-Evaluation  06/02/19    PT Start Time  1430    PT Stop Time  1505    PT Time Calculation (min)  35 min       Past Medical History:  Diagnosis Date  . Cancer [facility])    Skin cancer  . Hyperlipidemia   . Hypertension     Past Surgical History:  Procedure Laterality Date  . COLONOSCOPY    . SALPINGOOPHORECTOMY    . TOTAL HIP ARTHROPLASTY Left 09/06/2018   Procedure: TOTAL HIP ARTHROPLASTY;  Surgeon: Earlie Server, MD;  Location: [facility];  Service: Orthopedics;  Laterality: Left;  . UTERINE ARTERY EMBOLIZATION      There were no vitals filed for this visit.  Subjective Assessment - 06/05/19 1431    Subjective  "Today is my last day"    Currently in Pain?  No/denies         [facility] PT Assessment - 06/05/19 0001      Assessment   Medical Diagnosis  s/p left THA    Referring Provider (PT)  Caffery      Strength   Left Hip Flexion  4+/5    Left Hip Extension  4+/5    Left Hip External Rotation  4+/5    Left Hip Internal Rotation  4+/5                   [facility] Adult PT Treatment/Exercise - 06/05/19 0001      Ambulation/Gait   Stairs  Yes    Stairs Assistance  6: Modified independent (Device/Increase time)    Stair Management Technique  No rails;One rail Right;Alternating pattern    Number of Stairs  48    Height of Stairs  6      Knee/Hip Exercises: Aerobic   Recumbent Bike  3 min L0     Nustep  L4 x 6 min LE only       Knee/Hip Exercises: Machines for Strengthening   Cybex Knee Extension  10lb 2x10, LLE 5lb 2x5     Cybex Knee Flexion  25lb 2x15, LLE  15lb 2x10    Cybex Leg Press  30lb 2x15, LLE 20lb 2x5       Knee/Hip Exercises: Seated   Ball Squeeze  2x10     Abduction/Adduction   3 sets;10 reps;Other (comment);Right;AROM               PT Short Term Goals - 02/03/19 1435      PT SHORT TERM GOAL #1   Title  Ind with inital HEP    Status  Achieved        PT Long Term Goals - 06/05/19 1509      PT LONG TERM GOAL #3   Title  Patient to demo 4+ to 5/5 BLE strength to ease ADLs.     Status  Achieved            Plan - 06/05/19 1506    Clinical Impression Statement  All goals are mt. Pt is pleased with her current functional status and reports no  limitation at home. Some stairs compleated without UE assist. Still some struggle with SL extentions due to fatigue and weakness.    Stability/Clinical Decision Making  Stable/Uncomplicated    Rehab Potential  Good    PT Frequency  2x / week    PT Duration  8 weeks    PT Treatment/Interventions  ADLs/Self Care Home Management;Electrical Stimulation;Moist Heat;Ultrasound;Cryotherapy;Therapeutic exercise;Neuromuscular re-education;Patient/family education;Manual techniques;Iontophoresis 51m/ml Dexamethasone    PT Next Visit Plan  D/C       Patient will benefit from skilled therapeutic intervention in order to improve the following deficits and impairments:  Difficulty walking, Pain, Decreased activity tolerance, Decreased strength, Decreased range of motion, Impaired flexibility, Abnormal gait, Increased muscle spasms, Decreased mobility, Decreased endurance  Visit Diagnosis: Stiffness of left hip, not elsewhere classified  Difficulty in walking, not elsewhere classified  Chronic bilateral low back pain, unspecified whether sciatica present  Pain in left hip     Problem List Patient Active Problem List   Diagnosis Date Noted  . S/P total hip arthroplasty 09/07/2018  . Degenerative joint disease of left hip 09/06/2018  . B12 deficiency 02/01/2018  . DDD  (degenerative disc disease), lumbar 01/03/2018  . Hyponatremia 10/04/2017  . Thrombocytosis ([facility] 10/04/2017  . Chronic respiratory failure with hypoxia ([facility] 09/01/2017  . Dyslipidemia, goal LDL below 70 12/06/2016  . Aortic atherosclerosis ([facility] 12/06/2016  . Diverticulosis 04/11/2016  . Essential hypertension 10/03/2013  . Current smoker 10/03/2013  . Esophageal reflux 10/03/2013  . Anxiety disorder 10/03/2013  . Allergic rhinitis 10/03/2013  . Insomnia 07/17/2013   PHYSICAL THERAPY DISCHARGE SUMMARY  Visits from Start of Care: 24   Plan: Patient agrees to discharge.  Patient goals were met. Patient is being discharged due to meeting the stated rehab goals.  ?????       [name] 06/05/2019, 3:09 PM  [address] [address]Phone: [phone]  Fax:  [phone] Name: BODELL FASCHINGMRN: [id number] of Birth: 701-20-1952

## 2019-06-11 DIAGNOSIS — Z20822 Contact with and (suspected) exposure to covid-19: Secondary | ICD-10-CM | POA: Diagnosis not present

## 2019-06-27 DIAGNOSIS — R918 Other nonspecific abnormal finding of lung field: Secondary | ICD-10-CM | POA: Diagnosis not present

## 2019-06-27 DIAGNOSIS — Z1159 Encounter for screening for other viral diseases: Secondary | ICD-10-CM | POA: Diagnosis not present

## 2019-06-27 DIAGNOSIS — N289 Disorder of kidney and ureter, unspecified: Secondary | ICD-10-CM | POA: Diagnosis not present

## 2019-06-27 DIAGNOSIS — I10 Essential (primary) hypertension: Secondary | ICD-10-CM | POA: Diagnosis not present

## 2019-06-27 DIAGNOSIS — F17200 Nicotine dependence, unspecified, uncomplicated: Secondary | ICD-10-CM | POA: Diagnosis not present

## 2019-06-27 DIAGNOSIS — G4700 Insomnia, unspecified: Secondary | ICD-10-CM | POA: Diagnosis not present

## 2019-07-02 DIAGNOSIS — Z1231 Encounter for screening mammogram for malignant neoplasm of breast: Secondary | ICD-10-CM | POA: Diagnosis not present

## 2019-07-11 DIAGNOSIS — F17210 Nicotine dependence, cigarettes, uncomplicated: Secondary | ICD-10-CM | POA: Diagnosis not present

## 2019-08-12 DIAGNOSIS — E559 Vitamin D deficiency, unspecified: Secondary | ICD-10-CM | POA: Diagnosis not present

## 2019-09-20 DIAGNOSIS — K5732 Diverticulitis of large intestine without perforation or abscess without bleeding: Secondary | ICD-10-CM | POA: Diagnosis not present

## 2019-09-20 DIAGNOSIS — F17210 Nicotine dependence, cigarettes, uncomplicated: Secondary | ICD-10-CM | POA: Diagnosis not present

## 2019-09-20 DIAGNOSIS — Z7952 Long term (current) use of systemic steroids: Secondary | ICD-10-CM | POA: Diagnosis not present

## 2019-09-20 DIAGNOSIS — Z7982 Long term (current) use of aspirin: Secondary | ICD-10-CM | POA: Diagnosis not present

## 2019-09-20 DIAGNOSIS — E785 Hyperlipidemia, unspecified: Secondary | ICD-10-CM | POA: Diagnosis not present

## 2019-09-20 DIAGNOSIS — K769 Liver disease, unspecified: Secondary | ICD-10-CM | POA: Diagnosis not present

## 2019-09-20 DIAGNOSIS — Z79899 Other long term (current) drug therapy: Secondary | ICD-10-CM | POA: Diagnosis not present

## 2019-09-20 DIAGNOSIS — I10 Essential (primary) hypertension: Secondary | ICD-10-CM | POA: Diagnosis not present

## 2020-03-17 ENCOUNTER — Ambulatory Visit: Payer: PPO | Admitting: Family Medicine

## 2020-03-17 ENCOUNTER — Other Ambulatory Visit: Payer: Self-pay

## 2020-03-17 NOTE — Progress Notes (Deleted)
[facility] [address] [place] [address] Phone: [phone] Subjective:    I'm seeing this patient by the request  of:  Worthy Rancher, MD  CC:   [id number]  Beverly [name] is a 70 y.o. female coming in with complaint of hip pain. History of replacement 1 year ago. Patient states        Past Medical History:  Diagnosis Date  . Cancer [facility])    Skin cancer  . Hyperlipidemia   . Hypertension    Past Surgical History:  Procedure Laterality Date  . COLONOSCOPY    . SALPINGOOPHORECTOMY    . TOTAL HIP ARTHROPLASTY Left 09/06/2018   Procedure: TOTAL HIP ARTHROPLASTY;  Surgeon: Frederico Hamman, MD;  Location: [facility];  Service: Orthopedics;  Laterality: Left;  . UTERINE ARTERY EMBOLIZATION     Social History   Socioeconomic History  . Marital status: Married    Spouse name: Not on file  . Number of children: 1  . Years of education: Not on file  . Highest education level: Not on file  Occupational History  . Not on file  Tobacco Use  . Smoking status: Current Every Day Smoker    Packs/day: 0.50    Years: 40.00    Pack years: 20.00    Types: Cigarettes  . Smokeless tobacco: Never Used  Vaping Use  . Vaping Use: Never used  Substance and Sexual Activity  . Alcohol use: Yes    Comment: occ  . Drug use: Never  . Sexual activity: Not on file  Other Topics Concern  . Not on file  Social History Narrative  . Not on file   Social Determinants of Health   Financial Resource Strain: Not on file  Food Insecurity: Not on file  Transportation Needs: Not on file  Physical Activity: Not on file  Stress: Not on file  Social Connections: Not on file   No Known Allergies Family History  Problem Relation Age of Onset  . Heart attack Father     Current Outpatient Medications (Endocrine & Metabolic):  .  alendronate (FOSAMAX) 70 MG tablet, Take 70 mg by mouth once a week.   Current Outpatient Medications  (Cardiovascular):  .  atorvastatin (LIPITOR) 20 MG tablet, Take 20 mg by mouth daily.  Marland Kitchen  ezetimibe (ZETIA) 10 MG tablet, Take 10 mg by mouth daily.  .  fenofibrate 160 MG tablet, Take 160 mg by mouth daily.  Marland Kitchen  losartan (COZAAR) 100 MG tablet, Take 100 mg by mouth daily.  .  metoprolol succinate (TOPROL-XL) 100 MG 24 hr tablet, Take 100 mg by mouth daily.   Current Outpatient Medications (Respiratory):  Marland Kitchen  ADVAIR HFA 45-21 MCG/ACT inhaler, Inhale 2 puffs into the lungs daily as needed (shortness of breath or wheezing).   Current Outpatient Medications (Analgesics):  .  acetaminophen (TYLENOL) 500 MG tablet, Take 500 mg by mouth every 6 (six) hours as needed for moderate pain.  .  celecoxib (CELEBREX) 200 MG capsule, Take 200 mg by mouth daily as needed for moderate pain.  Marland Kitchen  oxyCODONE (OXY IR/ROXICODONE) 5 MG immediate release tablet, Take one tab po q4-6hrs prn pain, may need 1-2 first couple weeks .  traMADol (ULTRAM) 50 MG tablet, Take 50 mg by mouth every 6 (six) hours as needed for moderate pain.   Current Outpatient Medications (Hematological):  .  apixaban (ELIQUIS) 2.5 MG TABS tablet, Take 1 tablet (2.5 mg total) by mouth 2 (two) times  daily. .  vitamin B-12 (CYANOCOBALAMIN) 1000 MCG tablet, Take 1,000 mcg by mouth daily.  Current Outpatient Medications (Other):  .  baclofen (LIORESAL) 10 MG tablet, Take 1 tablet (10 mg total) by mouth 3 (three) times daily as needed for muscle spasms. .  Cholecalciferol (VITAMIN D3) 50 MCG (2000 UT) TABS, Take 2,000 Units by mouth daily.  .  hydroxypropyl methylcellulose / hypromellose (ISOPTO TEARS / GONIOVISC) 2.5 % ophthalmic solution, Place 1 drop into both eyes 2 (two) times daily as needed for dry eyes. Marland Kitchen  venlafaxine XR (EFFEXOR-XR) 37.5 MG 24 hr capsule, Take 37.5 mg by mouth daily with breakfast.    Reviewed prior external information including notes and imaging from  primary care provider As well as notes that were available from care  everywhere and other healthcare systems.  Past medical history, social, surgical and family history all reviewed in electronic medical record.  No pertanent information unless stated regarding to the chief complaint.   Review of Systems:  No headache, visual changes, nausea, vomiting, diarrhea, constipation, dizziness, abdominal pain, skin rash, fevers, chills, night sweats, weight loss, swollen lymph nodes, body aches, joint swelling, chest pain, shortness of breath, mood changes. POSITIVE muscle aches  Objective  There were no vitals taken for this visit.   General: No apparent distress alert and oriented x3 mood and affect normal, dressed appropriately.  HEENT: Pupils equal, extraocular movements intact  Respiratory: Patient's speak in full sentences and does not appear short of breath  Cardiovascular: No lower extremity edema, non tender, no erythema  Neuro: Cranial nerves II through XII are intact, neurovascularly intact in all extremities with 2+ DTRs and 2+ pulses.  Gait normal with good balance and coordination.  MSK:  Non tender with full range of motion and good stability and symmetric strength and tone of shoulders, elbows, wrist, hip, knee and ankles bilaterally.     Impression and Recommendations:     The above documentation has been reviewed and is accurate and complete Lyndal Pulley, DO

## 2020-03-19 DIAGNOSIS — U071 COVID-19: Secondary | ICD-10-CM | POA: Diagnosis not present

## 2020-04-04 NOTE — Progress Notes (Signed)
[place] Phone: [phone] Subjective:   Fontaine No, am serving as a scribe for Dr. Hulan Saas. This visit occurred during the SARS-CoV-2 public health emergency.  Safety protocols were in place, including screening questions prior to the visit, additional usage of staff PPE, and extensive cleaning of exam room while observing appropriate contact time as indicated for disinfecting solutions.   I'm seeing this patient by the request  of:  Thomes Dinning, MD  CC:   [id number]  Beverly [name] is a 70 y.o. female coming in with complaint of left hip pain. Patient states that she had replacement June 2019. Patient states that she does not have all of her strength and balance like she use to have in that hip. No pain in that area. Others note that she walks slower.   Onset-  Location Duration-  Character- Aggravating factors- Reliving factors-  Therapies tried-  Severity-   Last physical therapy was 05/2019 Total hip replacement 09/06/2018 NEED XRAY  Past Medical History:  Diagnosis Date  . Cancer [facility])    Skin cancer  . Hyperlipidemia   . Hypertension    Past Surgical History:  Procedure Laterality Date  . COLONOSCOPY    . SALPINGOOPHORECTOMY    . TOTAL HIP ARTHROPLASTY Left 09/06/2018   Procedure: TOTAL HIP ARTHROPLASTY;  Surgeon: Earlie Server, MD;  Location: [facility];  Service: Orthopedics;  Laterality: Left;  . UTERINE ARTERY EMBOLIZATION     Social History   Socioeconomic History  . Marital status: Married    Spouse name: Not on file  . Number of children: 1  . Years of education: Not on file  . Highest education level: Not on file  Occupational History  . Not on file  Tobacco Use  . Smoking status: Current Every Day Smoker    Packs/day: 0.50    Years: 40.00    Pack years: 20.00    Types: Cigarettes  . Smokeless tobacco: Never Used  Vaping Use  . Vaping Use: Never used   Substance and Sexual Activity  . Alcohol use: Yes    Comment: occ  . Drug use: Never  . Sexual activity: Not on file  Other Topics Concern  . Not on file  Social History Narrative  . Not on file   Social Determinants of Health   Financial Resource Strain: Not on file  Food Insecurity: Not on file  Transportation Needs: Not on file  Physical Activity: Not on file  Stress: Not on file  Social Connections: Not on file   No Known Allergies Family History  Problem Relation Age of Onset  . Heart attack Father     Current Outpatient Medications (Endocrine & Metabolic):  .  alendronate (FOSAMAX) 70 MG tablet, Take 70 mg by mouth once a week.   Current Outpatient Medications (Cardiovascular):  .  atorvastatin (LIPITOR) 20 MG tablet, Take 20 mg by mouth daily.  Marland Kitchen  ezetimibe (ZETIA) 10 MG tablet, Take 10 mg by mouth daily.  .  fenofibrate 160 MG tablet, Take 160 mg by mouth daily.  Marland Kitchen  losartan (COZAAR) 100 MG tablet, Take 100 mg by mouth daily.  .  metoprolol succinate (TOPROL-XL) 100 MG 24 hr tablet, Take 100 mg by mouth daily.   Current Outpatient Medications (Respiratory):  Marland Kitchen  ADVAIR HFA 45-21 MCG/ACT inhaler, Inhale 2 puffs into the lungs daily as needed (shortness of breath or wheezing).   Current Outpatient Medications (  Analgesics):  .  acetaminophen (TYLENOL) 500 MG tablet, Take 500 mg by mouth every 6 (six) hours as needed for moderate pain.  .  celecoxib (CELEBREX) 200 MG capsule, Take 200 mg by mouth daily as needed for moderate pain.  Marland Kitchen  oxyCODONE (OXY IR/ROXICODONE) 5 MG immediate release tablet, Take one tab po q4-6hrs prn pain, may need 1-2 first couple weeks .  traMADol (ULTRAM) 50 MG tablet, Take 50 mg by mouth every 6 (six) hours as needed for moderate pain.   Current Outpatient Medications (Hematological):  .  apixaban (ELIQUIS) 2.5 MG TABS tablet, Take 1 tablet (2.5 mg total) by mouth 2 (two) times daily. .  vitamin B-12 (CYANOCOBALAMIN) 1000 MCG tablet, Take  1,000 mcg by mouth daily.  Current Outpatient Medications (Other):  .  baclofen (LIORESAL) 10 MG tablet, Take 1 tablet (10 mg total) by mouth 3 (three) times daily as needed for muscle spasms. .  Cholecalciferol (VITAMIN D3) 50 MCG (2000 UT) TABS, Take 2,000 Units by mouth daily.  .  hydroxypropyl methylcellulose / hypromellose (ISOPTO TEARS / GONIOVISC) 2.5 % ophthalmic solution, Place 1 drop into both eyes 2 (two) times daily as needed for dry eyes. Marland Kitchen  venlafaxine XR (EFFEXOR-XR) 37.5 MG 24 hr capsule, Take 37.5 mg by mouth daily with breakfast.    Reviewed prior external information including notes and imaging from  primary care provider As well as notes that were available from care everywhere and other healthcare systems.  Past medical history, social, surgical and family history all reviewed in electronic medical record.  No pertanent information unless stated regarding to the chief complaint.   Review of Systems:  No headache, visual changes, nausea, vomiting, diarrhea, constipation, dizziness, abdominal pain, skin rash, fevers, chills, night sweats, weight loss, swollen lymph nodes, b, joint swelling, chest pain, shortness of breath, mood changes. POSITIVE muscle aches, body aches  Objective  Blood pressure (!) 142/100, pulse 85, height 5\' 2"  (1.575 m), weight 135 lb (61.2 kg), SpO2 99 %.   General: No apparent distress alert and oriented x3 mood and affect normal, dressed appropriately.  HEENT: Pupils equal, extraocular movements intact  Respiratory: Patient's speak in full sentences and does not appear short of breath  Cardiovascular: No lower extremity edema, non tender, no erythema  Gait patient does have an antalgic gait.  Patient does not have a true foot drop but does have a shorter stride on the left foot.  Patient does have a very mild instability noted of the left leg comparatively.  No atrophy noted.  Patient has mild weakness with hip flexion compared to the contralateral  side.  Negative straight leg test.  Patient is minorly tender in the posterior aspect around the left sacroiliac joint in the gluteal area and the greater trochanteric area.  Patient is able to do [place].  Negative fulcrum test of the leg.  No significant atrophy of the thigh noted.    Impression and Recommendations:     The above documentation has been reviewed and is accurate and complete Lyndal Pulley, DO

## 2020-04-05 ENCOUNTER — Encounter: Payer: Self-pay | Admitting: Family Medicine

## 2020-04-05 ENCOUNTER — Ambulatory Visit (INDEPENDENT_AMBULATORY_CARE_PROVIDER_SITE_OTHER): Payer: Medicare HMO

## 2020-04-05 ENCOUNTER — Other Ambulatory Visit: Payer: Self-pay

## 2020-04-05 ENCOUNTER — Ambulatory Visit (INDEPENDENT_AMBULATORY_CARE_PROVIDER_SITE_OTHER): Payer: Medicare HMO | Admitting: Family Medicine

## 2020-04-05 VITALS — BP 142/100 | HR 85 | Ht 62.0 in | Wt 135.0 lb

## 2020-04-05 DIAGNOSIS — R2689 Other abnormalities of gait and mobility: Secondary | ICD-10-CM

## 2020-04-05 DIAGNOSIS — M25552 Pain in left hip: Secondary | ICD-10-CM | POA: Diagnosis not present

## 2020-04-05 DIAGNOSIS — G8929 Other chronic pain: Secondary | ICD-10-CM

## 2020-04-05 DIAGNOSIS — R29898 Other symptoms and signs involving the musculoskeletal system: Secondary | ICD-10-CM | POA: Insufficient documentation

## 2020-04-05 DIAGNOSIS — M545 Low back pain, unspecified: Secondary | ICD-10-CM

## 2020-04-05 DIAGNOSIS — Z96642 Presence of left artificial hip joint: Secondary | ICD-10-CM | POA: Diagnosis not present

## 2020-04-05 NOTE — Assessment & Plan Note (Signed)
Patient states that she never fully regained function after the hip replacement.  Has done formal physical therapy a significant amount of would like to try it again.  We will do for more balance and coordination.  X-rays of the hip and lumbar spine ordered today.  Patient is on multiple different medications and wants to hold on any significant for now.  May need nerve conduction study or further advanced imaging if this continues.  Follow-up with me again 6 weeks

## 2020-04-05 NOTE — Patient Instructions (Signed)
Xrays today Add extra insole to left shoe PT [place] will call you-balance  2000IU of Vit D Daily See me in 5-6 weeks, if not better will consider labs, EMG, and potential MRI

## 2020-04-05 NOTE — Assessment & Plan Note (Signed)
Patient does have some weakness of the lower extremity on the left side. Patient does not walk with a true foot drop but does notice some weakness. Patient does have degenerative disc disease of the lumbar spine and lumbar radiculopathy is within the potential differential. Patient has many different comorbidities and do not want to add other medications at this time but will try formal physical therapy again for balance and coordination. If patient continues to have difficulty I would like to consider nerve conduction test of the left lower extremity as well with potential MRI of either the lumbar or hip area. X-rays ordered today. Follow-up again in 5 to 6 weeks

## 2020-04-08 ENCOUNTER — Ambulatory Visit: Payer: Medicare HMO | Admitting: Physical Therapy

## 2020-04-12 ENCOUNTER — Ambulatory Visit: Payer: Medicare HMO | Attending: Family Medicine | Admitting: Physical Therapy

## 2020-04-12 ENCOUNTER — Encounter: Payer: Self-pay | Admitting: Physical Therapy

## 2020-04-12 ENCOUNTER — Other Ambulatory Visit: Payer: Self-pay

## 2020-04-12 DIAGNOSIS — R262 Difficulty in walking, not elsewhere classified: Secondary | ICD-10-CM | POA: Diagnosis not present

## 2020-04-12 DIAGNOSIS — M6281 Muscle weakness (generalized): Secondary | ICD-10-CM | POA: Diagnosis not present

## 2020-04-12 DIAGNOSIS — R2681 Unsteadiness on feet: Secondary | ICD-10-CM | POA: Diagnosis not present

## 2020-04-12 DIAGNOSIS — M25652 Stiffness of left hip, not elsewhere classified: Secondary | ICD-10-CM | POA: Diagnosis not present

## 2020-04-12 DIAGNOSIS — M25552 Pain in left hip: Secondary | ICD-10-CM | POA: Insufficient documentation

## 2020-04-12 NOTE — Therapy (Signed)
[place]. [place], [place], [address] Phone: [phone]   Fax:  [phone]  Physical Therapy Evaluation  Patient Details  Name: Beverly [name] MRN: [id number] Date of Birth: [birth year] Referring Provider (PT): Creig Hines   Encounter Date: 04/12/2020   PT End of Session - 04/12/20 1431    Visit Number 1    Date for PT Re-Evaluation 06/10/20    Authorization Type Aetna Medicare    PT Start Time 1400    PT Stop Time 1440    PT Time Calculation (min) 40 min    Activity Tolerance Patient tolerated treatment well    Behavior During Therapy [facility] for tasks assessed/performed           Past Medical History:  Diagnosis Date  . Cancer [facility])    Skin cancer  . Hyperlipidemia   . Hypertension     Past Surgical History:  Procedure Laterality Date  . COLONOSCOPY    . SALPINGOOPHORECTOMY    . TOTAL HIP ARTHROPLASTY Left 09/06/2018   Procedure: TOTAL HIP ARTHROPLASTY;  Surgeon: Earlie Server, MD;  Location: [facility];  Service: Orthopedics;  Laterality: Left;  . UTERINE ARTERY EMBOLIZATION      There were no vitals filed for this visit.    Subjective Assessment - 04/12/20 1405    Subjective Patient reports that she had long standing left hip pain, she underwent a left THA 09/06/2018.  She reports that she has struggled with ROM, strength, balance and function.  She has had 1 fall recently.  She reports that she has weakness of the hip and unsteadiness, reports that she struggles on stairs does them one at a time.    Limitations Lifting;Walking;House hold activities    Patient Stated Goals "be normal again" "feel stonger" go up and down stairs better, get up from the floor better and easier    Currently in Pain? Yes    Pain Score 2     Pain Location Hip    Pain Orientation Left;Lateral    Pain Descriptors / Indicators Sore    Pain Type Chronic pain    Pain Radiating Towards denies    Pain Onset More than a month  ago    Pain Frequency Intermittent    Aggravating Factors  stairs    Pain Relieving Factors rest, no pain    Effect of Pain on Daily Activities difficulty with stairs, walking, getting up from the floor              [facility] PT Assessment - 04/12/20 0001      Assessment   Medical Diagnosis difficulty walking, weakness, left hip pain    Referring Provider (PT) Z. Smith    Onset Date/Surgical Date 04/05/20    Prior Therapy we saw her in early 2021, she did not have much PT after her suergery      Precautions   Precautions None      Balance Screen   Has the patient fallen in the past 6 months Yes    How many times? 1    Has the patient had a decrease in activity level because of a fear of falling?  Yes    Is the patient reluctant to leave their home because of a fear of falling?  No      Home Environment   Additional Comments has stairs at home, I Try at times to do some yardwork and housework      Prior  Function   Level of Independence Independent    Vocation Retired    Leisure does some floor exercises 2-3 x/week      Posture/Postural Control   Posture Comments decreased lordosis      ROM / Strength   AROM / PROM / Strength AROM;Strength      AROM   AROM Assessment Site Hip    Right/Left Hip Left    Left Hip Flexion 65    Left Hip ABduction 10      Strength   Overall Strength Comments right hip 4-/5, right knee 4-/5    Strength Assessment Site Hip;Knee    Right/Left Hip Left    Left Hip Flexion 3+/5    Left Hip Extension 3+/5    Left Hip ABduction 3+/5    Right/Left Knee Left    Left Knee Flexion 4-/5    Left Knee Extension 3+/5      Flexibility   Soft Tissue Assessment /Muscle Length yes    Hamstrings very tight some pain posterior knee    Quadriceps very tgiht, tight hip flexors and very tight adductors    Piriformis tight      Palpation   Palpation comment very tight in the lumbar mms, tender in the left lateral hip      Transfers   Comments uses  hands to get up      Ambulation/Gait   Gait Comments no device, slight circumduction left leg, antalgic on the left, stairs one at a time up and down, stiff in the trunk, slight forward flexed trunk, small steps      Standardized Balance Assessment   Standardized Balance Assessment Berg Balance Test      Berg Balance Test   Sit to Stand Able to stand  independently using hands    Standing Unsupported Able to stand safely 2 minutes    Sitting with Back Unsupported but Feet Supported on Floor or Stool Able to sit safely and securely 2 minutes    Stand to Sit Controls descent by using hands    Transfers Able to transfer safely, definite need of hands    Standing Unsupported with Eyes Closed Able to stand 10 seconds with supervision    Standing Unsupported with Feet Together Able to place feet together independently and stand for 1 minute with supervision    From Standing, Reach Forward with Outstretched Arm Can reach forward >12 cm safely (5")    From Standing Position, Pick up Object from Floor Able to pick up shoe safely and easily    From Standing Position, Turn to Look Behind Over each Shoulder Looks behind one side only/other side shows less weight shift    Turn 360 Degrees Able to turn 360 degrees safely one side only in 4 seconds or less    Standing Unsupported, Alternately Place Feet on Step/Stool Able to stand independently and complete 8 steps >20 seconds    Standing Unsupported, One Foot in Front Able to take small step independently and hold 30 seconds    Standing on One Leg Able to lift leg independently and hold equal to or more than 3 seconds    Total Score 43                      Objective measurements completed on examination: See above findings.                 PT Short Term Goals - 04/12/20 1527  PT SHORT TERM GOAL #1   Title Ind with inital HEP    Time 2    Period Weeks    Status New             PT Long Term Goals - 04/12/20  1529      PT LONG TERM GOAL #1   Title Patient to report decreased pain in her left hip by 50% or more with ADLs.    Time 8    Period Weeks    Status New      PT LONG TERM GOAL #2   Title Patient able to ambulate with a normal gait pattern with pain <= 2/10.    Time 8    Period Weeks    Status New      PT LONG TERM GOAL #3   Title Patient to demo 4+ to 5/5 BLE strength to ease ADLs.     Time 8    Period Weeks    Status New      PT LONG TERM GOAL #4   Title go up and down stairs reciprocally    Time 8    Period Weeks    Status New      PT LONG TERM GOAL #5   Title increase Berg balance score to 49/56    Time 8    Period Weeks    Status New                  Plan - 04/12/20 1431    Clinical Impression Statement Patient had a left THA in June 2020, she reports not having much PT at that time, we did see her about 5 months after the surgery and she reports never really getting "it all back".  She reports difficulty getting up from the floor, difficulty walking, difficulty on stairs, feeling weak and unsteady on her feet, she reports one fall, she has balance issues scoring a 43/56 on the Berg, she is very tight in the mms of the left hip.  Tender lateral left hip.    Stability/Clinical Decision Making Stable/Uncomplicated    Clinical Decision Making Low    Rehab Potential Good    PT Frequency 2x / week    PT Duration 8 weeks    PT Treatment/Interventions ADLs/Self Care Home Management;Electrical Stimulation;Iontophoresis 4mg /ml Dexamethasone;Moist Heat;Gait training;Neuromuscular re-education;Balance training;Therapeutic exercise;Therapeutic activities;Functional mobility training;Stair training;Patient/family education;Manual techniques    PT Next Visit Plan get her moving, work on strength, ROM and balance    Consulted and Agree with Plan of Care Patient           Patient will benefit from skilled therapeutic intervention in order to improve the following  deficits and impairments:  Abnormal gait,Decreased range of motion,Difficulty walking,Increased muscle spasms,Decreased endurance,Cardiopulmonary status limiting activity,Decreased activity tolerance,Pain,Improper body mechanics,Impaired flexibility,Decreased balance,Decreased mobility,Decreased strength  Visit Diagnosis: Stiffness of left hip, not elsewhere classified - Plan: PT plan of care cert/re-cert  Difficulty in walking, not elsewhere classified - Plan: PT plan of care cert/re-cert  Pain in left hip - Plan: PT plan of care cert/re-cert  Muscle weakness (generalized) - Plan: PT plan of care cert/re-cert  Unsteadiness on feet - Plan: PT plan of care cert/re-cert     Problem List Patient Active Problem List   Diagnosis Date Noted  . Left leg weakness 04/05/2020  . S/P total hip arthroplasty 09/07/2018  . Degenerative joint disease of left hip 09/06/2018  . B12 deficiency 02/01/2018  . DDD (degenerative disc disease), lumbar  01/03/2018  . Hyponatremia 10/04/2017  . Thrombocytosis 10/04/2017  . Chronic respiratory failure with hypoxia ([place]) 09/01/2017  . Dyslipidemia, goal LDL below 70 12/06/2016  . Aortic atherosclerosis ([place]) 12/06/2016  . Diverticulosis 04/11/2016  . Essential hypertension 10/03/2013  . Current smoker 10/03/2013  . Esophageal reflux 10/03/2013  . Anxiety disorder 10/03/2013  . Allergic rhinitis 10/03/2013  . Insomnia 07/17/2013    Sumner Boast., PT 04/12/2020, 3:32 PM  [place]. [place], [place], [address] Phone: [phone]   Fax:  [phone]  Name: Beverly [name] MRN: [id number] Date of Birth: 1950/04/26

## 2020-04-20 ENCOUNTER — Encounter: Payer: Self-pay | Admitting: Physical Therapy

## 2020-04-20 ENCOUNTER — Ambulatory Visit: Payer: Medicare HMO | Attending: Family Medicine | Admitting: Physical Therapy

## 2020-04-20 ENCOUNTER — Other Ambulatory Visit: Payer: Self-pay

## 2020-04-20 DIAGNOSIS — R262 Difficulty in walking, not elsewhere classified: Secondary | ICD-10-CM | POA: Diagnosis not present

## 2020-04-20 DIAGNOSIS — M6281 Muscle weakness (generalized): Secondary | ICD-10-CM | POA: Insufficient documentation

## 2020-04-20 DIAGNOSIS — M25552 Pain in left hip: Secondary | ICD-10-CM | POA: Diagnosis not present

## 2020-04-20 DIAGNOSIS — M25652 Stiffness of left hip, not elsewhere classified: Secondary | ICD-10-CM | POA: Insufficient documentation

## 2020-04-20 NOTE — Therapy (Signed)
[place]. [place], [place], [address] Phone: [phone]   Fax:  [phone]  Physical Therapy Treatment  Patient Details  Name: Beverly [name] MRN: [id number] Date of Birth: 06-Apr-1950 Referring Provider (PT): Creig Hines   Encounter Date: 04/20/2020   PT End of Session - 04/20/20 1511    Visit Number 2    Date for PT Re-Evaluation 06/10/20    Authorization Type Aetna Medicare    PT Start Time 1430    PT Stop Time 1512    PT Time Calculation (min) 42 min    Activity Tolerance Patient tolerated treatment well    Behavior During Therapy [facility] for tasks assessed/performed           Past Medical History:  Diagnosis Date  . Cancer [facility])    Skin cancer  . Hyperlipidemia   . Hypertension     Past Surgical History:  Procedure Laterality Date  . COLONOSCOPY    . SALPINGOOPHORECTOMY    . TOTAL HIP ARTHROPLASTY Left 09/06/2018   Procedure: TOTAL HIP ARTHROPLASTY;  Surgeon: Earlie Server, MD;  Location: [facility];  Service: Orthopedics;  Laterality: Left;  . UTERINE ARTERY EMBOLIZATION      There were no vitals filed for this visit.   Subjective Assessment - 04/20/20 1434    Subjective Pt reports needing balance and muscle mass    Currently in Pain? --   L hip gets sore from time to time                            [facility] Adult PT Treatment/Exercise - 04/20/20 0001      High Level Balance   High Level Balance Activities Side stepping;Backward walking    High Level Balance Comments side step over weighted bar, side step on aires      Exercises   Exercises Knee/Hip      Knee/Hip Exercises: Aerobic   Recumbent Bike L1.6 x 3 min    Nustep L5 x 5 min      Knee/Hip Exercises: Machines for Strengthening   Cybex Knee Extension 5lb 2x10    Cybex Knee Flexion 20lb 2x10    Cybex Leg Press 20lb 2x10      Knee/Hip Exercises: Standing   Lateral Step Up Both;1 set;5 sets;Hand Hold: 0   onto  airex   Walking with Sports Cord 20lb side step x5 each                    PT Short Term Goals - 04/20/20 1512      PT SHORT TERM GOAL #1   Title Ind with inital HEP    Status Partially Met             PT Long Term Goals - 04/12/20 1529      PT LONG TERM GOAL #1   Title Patient to report decreased pain in her left hip by 50% or more with ADLs.    Time 8    Period Weeks    Status New      PT LONG TERM GOAL #2   Title Patient able to ambulate with a normal gait pattern with pain <= 2/10.    Time 8    Period Weeks    Status New      PT LONG TERM GOAL #3   Title Patient to demo 4+ to 5/5 BLE strength to ease ADLs.  Time 8    Period Weeks    Status New      PT LONG TERM GOAL #4   Title go up and down stairs reciprocally    Time 8    Period Weeks    Status New      PT LONG TERM GOAL #5   Title increase Berg balance score to 49/56    Time 8    Period Weeks    Status New                 Plan - 04/20/20 1512    Clinical Impression Statement Pt did well with an introduction to LE strengthening. She completed all machine level interventions but with some fatigue. Some instability noted with all balance interventions. Decrease stp length with LLE with backwards walking. Visible shaking noted on non compliant surfaces.    Stability/Clinical Decision Making Stable/Uncomplicated    Rehab Potential Good    PT Frequency 2x / week    PT Duration 8 weeks    PT Treatment/Interventions ADLs/Self Care Home Management;Electrical Stimulation;Iontophoresis 26m/ml Dexamethasone;Moist Heat;Gait training;Neuromuscular re-education;Balance training;Therapeutic exercise;Therapeutic activities;Functional mobility training;Stair training;Patient/family education;Manual techniques    PT Next Visit Plan get her moving, work on strength, ROM and balance           Patient will benefit from skilled therapeutic intervention in order to improve the following deficits and  impairments:  Abnormal gait,Decreased range of motion,Difficulty walking,Increased muscle spasms,Decreased endurance,Cardiopulmonary status limiting activity,Decreased activity tolerance,Pain,Improper body mechanics,Impaired flexibility,Decreased balance,Decreased mobility,Decreased strength  Visit Diagnosis: Pain in left hip  Muscle weakness (generalized)  Difficulty in walking, not elsewhere classified  Stiffness of left hip, not elsewhere classified     Problem List Patient Active Problem List   Diagnosis Date Noted  . Left leg weakness 04/05/2020  . S/P total hip arthroplasty 09/07/2018  . Degenerative joint disease of left hip 09/06/2018  . B12 deficiency 02/01/2018  . DDD (degenerative disc disease), lumbar 01/03/2018  . Hyponatremia 10/04/2017  . Thrombocytosis 10/04/2017  . Chronic respiratory failure with [place] 09/01/2017  . Dyslipidemia, goal LDL below 70 12/06/2016  . Aortic atherosclerosis ([place] 12/06/2016  . Diverticulosis 04/11/2016  . Essential hypertension 10/03/2013  . Current smoker 10/03/2013  . Esophageal reflux 10/03/2013  . Anxiety disorder 10/03/2013  . Allergic rhinitis 10/03/2013  . Insomnia 07/17/2013    RScot Jun2/10/2020, 3:17 PM  [place] [address]Phone: [phone]  Fax:  [phone] Name: BFREDDI FORSTERMRN: [id number] of Birth: 712-Mar-1952

## 2020-04-22 ENCOUNTER — Ambulatory Visit: Payer: Medicare HMO | Admitting: Physical Therapy

## 2020-04-28 ENCOUNTER — Ambulatory Visit: Payer: Medicare HMO | Admitting: Physical Therapy

## 2020-04-28 ENCOUNTER — Other Ambulatory Visit: Payer: Self-pay

## 2020-04-28 ENCOUNTER — Encounter: Payer: Self-pay | Admitting: Physical Therapy

## 2020-04-28 DIAGNOSIS — R262 Difficulty in walking, not elsewhere classified: Secondary | ICD-10-CM

## 2020-04-28 DIAGNOSIS — M25552 Pain in left hip: Secondary | ICD-10-CM | POA: Diagnosis not present

## 2020-04-28 DIAGNOSIS — M6281 Muscle weakness (generalized): Secondary | ICD-10-CM | POA: Diagnosis not present

## 2020-04-28 DIAGNOSIS — M25652 Stiffness of left hip, not elsewhere classified: Secondary | ICD-10-CM

## 2020-04-28 NOTE — Therapy (Signed)
[place]. [place], [place], [address] Phone: [phone]   Fax:  [phone]  Physical Therapy Treatment  Patient Details  Name: Beverly [name] MRN: [id number] Date of Birth: 08/29/1950 Referring Provider (PT): Creig Hines   Encounter Date: 04/28/2020   PT End of Session - 04/28/20 1508    Visit Number 3    Date for PT Re-Evaluation 06/10/20    Authorization Type Aetna Medicare    PT Start Time 7681    PT Stop Time 1510    PT Time Calculation (min) 42 min    Activity Tolerance Patient tolerated treatment well    Behavior During Therapy [facility] for tasks assessed/performed           Past Medical History:  Diagnosis Date  . Cancer [facility])    Skin cancer  . Hyperlipidemia   . Hypertension     Past Surgical History:  Procedure Laterality Date  . COLONOSCOPY    . SALPINGOOPHORECTOMY    . TOTAL HIP ARTHROPLASTY Left 09/06/2018   Procedure: TOTAL HIP ARTHROPLASTY;  Surgeon: Earlie Server, MD;  Location: [facility];  Service: Orthopedics;  Laterality: Left;  . UTERINE ARTERY EMBOLIZATION      There were no vitals filed for this visit.   Subjective Assessment - 04/28/20 1430    Subjective "Feeling good"    Currently in Pain? No/denies                             [facility] Adult PT Treatment/Exercise - 04/28/20 0001      High Level Balance   High Level Balance Activities Side stepping;Tandem walking   in bars   High Level Balance Comments side stepping on balace beam      Knee/Hip Exercises: Aerobic   Nustep L5 x 6 min LE only      Knee/Hip Exercises: Standing   Hip Flexion Stengthening;Both;2 sets;Knee bent;10 reps    Hip ADduction Strengthening;Both;2 sets;10 reps    Hip ADduction Limitations 2    Hip Extension Both;10 reps;Knee bent;Stengthening    Forward Step Up Both;Step Height: 6";5 reps;2 sets;Hand Hold: 0      Knee/Hip Exercises: Seated   Sit to Sand 2 sets;10 reps;without UE  support   LE on airex                   PT Short Term Goals - 04/20/20 1512      PT SHORT TERM GOAL #1   Title Ind with inital HEP    Status Partially Met             PT Long Term Goals - 04/28/20 1508      PT LONG TERM GOAL #1   Title Patient to report decreased pain in her left hip by 50% or more with ADLs.    Status On-going      PT LONG TERM GOAL #2   Title Patient able to ambulate with a normal gait pattern with pain <= 2/10.    Status On-going                 Plan - 04/28/20 1508    Clinical Impression Statement All interventions complete. Bilat Le weakness present with resisted gait. Tactile cues to maintain correct posture with standing hip interventions. Some instability noted with step ups. Cues for anterior wt shifts needed what sit to stands. She was able to progress to  some reps without UE use with tandem walking.    Stability/Clinical Decision Making Stable/Uncomplicated    Rehab Potential Good    PT Frequency 2x / week    PT Duration 8 weeks    PT Treatment/Interventions ADLs/Self Care Home Management;Electrical Stimulation;Iontophoresis 62m/ml Dexamethasone;Moist Heat;Gait training;Neuromuscular re-education;Balance training;Therapeutic exercise;Therapeutic activities;Functional mobility training;Stair training;Patient/family education;Manual techniques    PT Next Visit Plan get her moving, work on strength, ROM and balance           Patient will benefit from skilled therapeutic intervention in order to improve the following deficits and impairments:  Abnormal gait,Decreased range of motion,Difficulty walking,Increased muscle spasms,Decreased endurance,Cardiopulmonary status limiting activity,Decreased activity tolerance,Pain,Improper body mechanics,Impaired flexibility,Decreased balance,Decreased mobility,Decreased strength  Visit Diagnosis: Difficulty in walking, not elsewhere classified  Muscle weakness (generalized)  Pain in left  hip  Stiffness of left hip, not elsewhere classified     Problem List Patient Active Problem List   Diagnosis Date Noted  . Left leg weakness 04/05/2020  . S/P total hip arthroplasty 09/07/2018  . Degenerative joint disease of left hip 09/06/2018  . B12 deficiency 02/01/2018  . DDD (degenerative disc disease), lumbar 01/03/2018  . Hyponatremia 10/04/2017  . Thrombocytosis 10/04/2017  . Chronic respiratory failure with hypoxia ([place] 09/01/2017  . Dyslipidemia, goal LDL below 70 12/06/2016  . Aortic atherosclerosis ([place] 12/06/2016  . Diverticulosis 04/11/2016  . Essential hypertension 10/03/2013  . Current smoker 10/03/2013  . Esophageal reflux 10/03/2013  . Anxiety disorder 10/03/2013  . Allergic rhinitis 10/03/2013  . Insomnia 07/17/2013    [name] 04/28/2020, 3:11 PM  [place] [address]Phone: [phone]  Fax:  [phone] Name: Beverly [name]: [id number] of Birth: [birth year]

## 2020-04-29 ENCOUNTER — Encounter: Payer: Self-pay | Admitting: Physical Therapy

## 2020-04-29 ENCOUNTER — Ambulatory Visit: Payer: Medicare HMO | Admitting: Physical Therapy

## 2020-04-29 DIAGNOSIS — M6281 Muscle weakness (generalized): Secondary | ICD-10-CM | POA: Diagnosis not present

## 2020-04-29 DIAGNOSIS — M25552 Pain in left hip: Secondary | ICD-10-CM

## 2020-04-29 DIAGNOSIS — R262 Difficulty in walking, not elsewhere classified: Secondary | ICD-10-CM

## 2020-04-29 DIAGNOSIS — M25652 Stiffness of left hip, not elsewhere classified: Secondary | ICD-10-CM | POA: Diagnosis not present

## 2020-04-29 NOTE — Therapy (Signed)
[place]. [place], [place], [address] Phone: [phone]   Fax:  [phone]  Physical Therapy Treatment  Patient Details  Name: TOLEEN LACHAPELLE MRN: [id number] Date of Birth: May 21, 1950 Referring Provider (PT): Creig Hines   Encounter Date: 04/29/2020   PT End of Session - 04/29/20 1424    Visit Number 4    Date for PT Re-Evaluation 06/10/20    Authorization Type Aetna Medicare    PT Start Time 3785    PT Stop Time 1429    PT Time Calculation (min) 36 min    Activity Tolerance Patient tolerated treatment well    Behavior During Therapy [facility] for tasks assessed/performed           Past Medical History:  Diagnosis Date  . Cancer [facility])    Skin cancer  . Hyperlipidemia   . Hypertension     Past Surgical History:  Procedure Laterality Date  . COLONOSCOPY    . SALPINGOOPHORECTOMY    . TOTAL HIP ARTHROPLASTY Left 09/06/2018   Procedure: TOTAL HIP ARTHROPLASTY;  Surgeon: Earlie Server, MD;  Location[facility];  Service: Orthopedics;  Laterality: Left;  . UTERINE ARTERY EMBOLIZATION      There were no vitals filed for this visit.   Subjective Assessment - 04/29/20 1356    Subjective "Good"    Currently in Pain? No/denies                             [facility] Adult PT Treatment/Exercise - 04/29/20 0001      Knee/Hip Exercises: Aerobic   Nustep L4 x 6 min LE only      Knee/Hip Exercises: Machines for Strengthening   Cybex Knee Extension 5lb 2x10    Cybex Knee Flexion 25lb 2x10      Knee/Hip Exercises: Standing   Forward Step Up Both;Step Height: 6";5 reps;2 sets;Hand Hold: 0    Walking with Sports Cord 30lb 4 way x3    Other Standing Knee Exercises Hip abd & ext red 2x10      Knee/Hip Exercises: Seated   Sit to Sand 2 sets;10 reps;without UE support   LE on airex                   PT Short Term Goals - 04/20/20 1512      PT SHORT TERM GOAL #1   Title Ind with inital  HEP    Status Partially Met             PT Long Term Goals - 04/28/20 1508      PT LONG TERM GOAL #1   Title Patient to report decreased pain in her left hip by 50% or more with ADLs.    Status On-going      PT LONG TERM GOAL #2   Title Patient able to ambulate with a normal gait pattern with pain <= 2/10.    Status On-going                 Plan - 04/29/20 1425    Clinical Impression Statement Pt ~ 8 minutes late for today's session. Some instability with resisted gait requiring min assist to correct. Frequent cues to take bigger step with step ups, LE would often clip the box when stepping up and stepping down. Bilat hip weakness present with standing hip abd and ext.    Stability/Clinical Decision Making Stable/Uncomplicated  Rehab Potential Good    PT Frequency 2x / week    PT Duration 8 weeks    PT Treatment/Interventions ADLs/Self Care Home Management;Electrical Stimulation;Iontophoresis 87m/ml Dexamethasone;Moist Heat;Gait training;Neuromuscular re-education;Balance training;Therapeutic exercise;Therapeutic activities;Functional mobility training;Stair training;Patient/family education;Manual techniques    PT Next Visit Plan get her moving, work on strength, ROM and balance           Patient will benefit from skilled therapeutic intervention in order to improve the following deficits and impairments:  Abnormal gait,Decreased range of motion,Difficulty walking,Increased muscle spasms,Decreased endurance,Cardiopulmonary status limiting activity,Decreased activity tolerance,Pain,Improper body mechanics,Impaired flexibility,Decreased balance,Decreased mobility,Decreased strength  Visit Diagnosis: Pain in left hip  Muscle weakness (generalized)  Difficulty in walking, not elsewhere classified     Problem List Patient Active Problem List   Diagnosis Date Noted  . Left leg weakness 04/05/2020  . S/P total hip arthroplasty 09/07/2018  . Degenerative joint  disease of left hip 09/06/2018  . B12 deficiency 02/01/2018  . DDD (degenerative disc disease), lumbar 01/03/2018  . Hyponatremia 10/04/2017  . Thrombocytosis 10/04/2017  . Chronic respiratory failure with hypoxia ([place] 09/01/2017  . Dyslipidemia, goal LDL below 70 12/06/2016  . Aortic atherosclerosis ([place] 12/06/2016  . Diverticulosis 04/11/2016  . Essential hypertension 10/03/2013  . Current smoker 10/03/2013  . Esophageal reflux 10/03/2013  . Anxiety disorder 10/03/2013  . Allergic rhinitis 10/03/2013  . Insomnia 07/17/2013    [name] 04/29/2020, 2:29 PM  [place] [address]Phone: [phone]  Fax:  [phone] Name: BDIVINE IMBERMRN: [id number] of Birth: 7September 28, 1952

## 2020-05-05 ENCOUNTER — Other Ambulatory Visit: Payer: Self-pay

## 2020-05-05 ENCOUNTER — Ambulatory Visit: Payer: Medicare HMO | Admitting: Physical Therapy

## 2020-05-05 ENCOUNTER — Encounter: Payer: Self-pay | Admitting: Physical Therapy

## 2020-05-05 DIAGNOSIS — R262 Difficulty in walking, not elsewhere classified: Secondary | ICD-10-CM

## 2020-05-05 DIAGNOSIS — M25552 Pain in left hip: Secondary | ICD-10-CM

## 2020-05-05 DIAGNOSIS — M6281 Muscle weakness (generalized): Secondary | ICD-10-CM | POA: Diagnosis not present

## 2020-05-05 DIAGNOSIS — M25652 Stiffness of left hip, not elsewhere classified: Secondary | ICD-10-CM | POA: Diagnosis not present

## 2020-05-05 NOTE — Therapy (Signed)
[place] [place], [place], [address] Phone: [phone]   Fax:  [phone]  Physical Therapy Treatment  Patient Details  Name: Beverly [name] MRN: [id number] Date of Birth: 01-30-1951 Referring Provider (PT): Creig Hines   Encounter Date: 05/05/2020   PT End of Session - 05/05/20 1509    Visit Number 5    Date for PT Re-Evaluation 06/10/20    Authorization Type Aetna Medicare    PT Start Time 1430    PT Stop Time 1511    PT Time Calculation (min) 41 min    Activity Tolerance Patient tolerated treatment well    Behavior During Therapy [facility] for tasks assessed/performed           Past Medical History:  Diagnosis Date  . Cancer [facility])    Skin cancer  . Hyperlipidemia   . Hypertension     Past Surgical History:  Procedure Laterality Date  . COLONOSCOPY    . SALPINGOOPHORECTOMY    . TOTAL HIP ARTHROPLASTY Left 09/06/2018   Procedure: TOTAL HIP ARTHROPLASTY;  Surgeon: Earlie Server, MD;  Location: [facility];  Service: Orthopedics;  Laterality: Left;  . UTERINE ARTERY EMBOLIZATION      There were no vitals filed for this visit.   Subjective Assessment - 05/05/20 1429    Subjective Signed up for silver sneakers after last session. went and worked out yesterday. Feeling pretty good today    Currently in Pain? No/denies                             [facility] Adult PT Treatment/Exercise - 05/05/20 0001      High Level Balance   High Level Balance Comments side step on and off airex      Knee/Hip Exercises: Aerobic   Recumbent Bike L1.6 x 3 min    Nustep L4 x 5 min LE only      Knee/Hip Exercises: Machines for Strengthening   Cybex Knee Flexion 25lb 2x10      Knee/Hip Exercises: Standing   Forward Step Up Both;Step Height: 6";5 reps;2 sets;Hand Hold: 0    Walking with Sports Cord 30lb 4 way x3    Other Standing Knee Exercises Hip abd & ext red 2x10    Other Standing Knee Exercises Red  x band side step                    PT Short Term Goals - 04/20/20 1512      PT SHORT TERM GOAL #1   Title Ind with inital HEP    Status Partially Met             PT Long Term Goals - 05/05/20 1450      PT LONG TERM GOAL #1   Title Patient to report decreased pain in her left hip by 50% or more with ADLs.    Status Partially Met      PT LONG TERM GOAL #2   Title Patient able to ambulate with a normal gait pattern with pain <= 2/10.    Status Partially Met      PT LONG TERM GOAL #3   Title Patient to demo 4+ to 5/5 BLE strength to ease ADLs.     Status On-going      PT LONG TERM GOAL #4   Title go up and down stairs reciprocally    Status Partially Met  Plan - 05/05/20 1509    Clinical Impression Statement Pt did well and is progressing towards goals. She had better control with resisted gait in all directions. Again feet would occasionally clip 6 in box with step ups. Increase reps tolerated with leg curls and extensions. LE weakness present with standing hip exercises. No LOB with airex side steps.    Rehab Potential Good    PT Frequency 2x / week    PT Duration 8 weeks    PT Treatment/Interventions ADLs/Self Care Home Management;Electrical Stimulation;Iontophoresis 36m/ml Dexamethasone;Moist Heat;Gait training;Neuromuscular re-education;Balance training;Therapeutic exercise;Therapeutic activities;Functional mobility training;Stair training;Patient/family education;Manual techniques    PT Next Visit Plan get her moving, work on strength, ROM and balance           Patient will benefit from skilled therapeutic intervention in order to improve the following deficits and impairments:  Abnormal gait,Decreased range of motion,Difficulty walking,Increased muscle spasms,Decreased endurance,Cardiopulmonary status limiting activity,Decreased activity tolerance,Pain,Improper body mechanics,Impaired flexibility,Decreased balance,Decreased  mobility,Decreased strength  Visit Diagnosis: Pain in left hip  Muscle weakness (generalized)  Difficulty in walking, not elsewhere classified     Problem List Patient Active Problem List   Diagnosis Date Noted  . Left leg weakness 04/05/2020  . S/P total hip arthroplasty 09/07/2018  . Degenerative joint disease of left hip 09/06/2018  . B12 deficiency 02/01/2018  . DDD (degenerative disc disease), lumbar 01/03/2018  . Hyponatremia 10/04/2017  . Thrombocytosis 10/04/2017  . Chronic respiratory failure with hypoxia ([place] 09/01/2017  . Dyslipidemia, goal LDL below 70 12/06/2016  . Aortic atherosclerosis ([place] 12/06/2016  . Diverticulosis 04/11/2016  . Essential hypertension 10/03/2013  . Current smoker 10/03/2013  . Esophageal reflux 10/03/2013  . Anxiety disorder 10/03/2013  . Allergic rhinitis 10/03/2013  . Insomnia 07/17/2013    [name] 05/05/2020, 3:11 PM  [place] [address]Phone: [phone]  Fax:  [phone] Name: Beverly DUFFYMRN: [id number] of Birth: 7Jun 08, 1952

## 2020-05-06 ENCOUNTER — Ambulatory Visit: Payer: Medicare HMO | Admitting: Physical Therapy

## 2020-05-06 ENCOUNTER — Encounter: Payer: Self-pay | Admitting: Physical Therapy

## 2020-05-06 DIAGNOSIS — M818 Other osteoporosis without current pathological fracture: Secondary | ICD-10-CM | POA: Diagnosis not present

## 2020-05-06 DIAGNOSIS — M25552 Pain in left hip: Secondary | ICD-10-CM

## 2020-05-06 DIAGNOSIS — M6281 Muscle weakness (generalized): Secondary | ICD-10-CM

## 2020-05-06 DIAGNOSIS — Z124 Encounter for screening for malignant neoplasm of cervix: Secondary | ICD-10-CM | POA: Diagnosis not present

## 2020-05-06 DIAGNOSIS — Z6825 Body mass index (BMI) 25.0-25.9, adult: Secondary | ICD-10-CM | POA: Diagnosis not present

## 2020-05-06 DIAGNOSIS — R262 Difficulty in walking, not elsewhere classified: Secondary | ICD-10-CM

## 2020-05-06 DIAGNOSIS — M810 Age-related osteoporosis without current pathological fracture: Secondary | ICD-10-CM | POA: Diagnosis not present

## 2020-05-06 DIAGNOSIS — M25652 Stiffness of left hip, not elsewhere classified: Secondary | ICD-10-CM | POA: Diagnosis not present

## 2020-05-06 NOTE — Therapy (Signed)
[place]. [place], [place], [address] Phone: [phone]   Fax:  [phone]  Physical Therapy Treatment  Patient Details  Name: Beverly [name] MRN: [id number] Date of Birth: [birth year] Referring Provider (PT): Creig Hines   Encounter Date: 05/06/2020   PT End of Session - 05/06/20 1510    Visit Number 6    Date for PT Re-Evaluation 06/10/20    Authorization Type Aetna Medicare    PT Start Time 1430    PT Stop Time 1511    PT Time Calculation (min) 41 min    Activity Tolerance Patient tolerated treatment well    Behavior During Therapy [facility] for tasks assessed/performed           Past Medical History:  Diagnosis Date  . Cancer [facility])    Skin cancer  . Hyperlipidemia   . Hypertension     Past Surgical History:  Procedure Laterality Date  . COLONOSCOPY    . SALPINGOOPHORECTOMY    . TOTAL HIP ARTHROPLASTY Left 09/06/2018   Procedure: TOTAL HIP ARTHROPLASTY;  Surgeon: Earlie Server, MD;  [facility];  Service: Orthopedics;  Laterality: Left;  . UTERINE ARTERY EMBOLIZATION      There were no vitals filed for this visit.   Subjective Assessment - 05/06/20 1431    Subjective doing ok    Currently in Pain? No/denies                             [facility] Adult PT Treatment/Exercise - 05/06/20 0001      Knee/Hip Exercises: Stretches   Passive Hamstring Stretch Left;3 reps;10 seconds    ITB Stretch Left;3 reps;10 seconds    Other Knee/Hip Stretches Single K2C stretch      Knee/Hip Exercises: Aerobic   Recumbent Bike L1.3 x 5 min    Nustep L4 x 4 min LE only      Knee/Hip Exercises: Machines for Strengthening   Cybex Knee Extension 5lb 3x10    Cybex Knee Flexion 20lb 3x10      Knee/Hip Exercises: Standing   Lateral Step Up Both;1 set;10 reps;Hand Hold: 0;Step Height: 4"    Forward Step Up Both;Step Height: 6";2 sets;Hand Hold: 0;10 reps    Walking with Sports Cord 10lb  resisted side step over dowl rod x10 each      Knee/Hip Exercises: Supine   Bridges Strengthening;10 reps;Both;2 sets    Other Supine Knee/Hip Exercises LE on Pball bridges                    PT Short Term Goals - 04/20/20 1512      PT SHORT TERM GOAL #1   Title Ind with inital HEP    Status Partially Met             PT Long Term Goals - 05/05/20 1450      PT LONG TERM GOAL #1   Title Patient to report decreased pain in her left hip by 50% or more with ADLs.    Status Partially Met      PT LONG TERM GOAL #2   Title Patient able to ambulate with a normal gait pattern with pain <= 2/10.    Status Partially Met      PT LONG TERM GOAL #3   Title Patient to demo 4+ to 5/5 BLE strength to ease ADLs.     Status On-going  PT LONG TERM GOAL #4   Title go up and down stairs reciprocally    Status Partially Met                 Plan - 05/06/20 1512    Clinical Impression Statement Feet continues to clip box with step ups, but pt can correct with cues. She did report some anterior L hip pain with NuStep warm up today. Some difficulty with resisted side steps over dowel rod so lower weight selected. L HS, piriformis, and ITB tightness noted with passive stretching.    Stability/Clinical Decision Making Stable/Uncomplicated    Rehab Potential Good    PT Frequency 2x / week    PT Duration 8 weeks    PT Treatment/Interventions ADLs/Self Care Home Management;Electrical Stimulation;Iontophoresis 34m/ml Dexamethasone;Moist Heat;Gait training;Neuromuscular re-education;Balance training;Therapeutic exercise;Therapeutic activities;Functional mobility training;Stair training;Patient/family education;Manual techniques    PT Next Visit Plan get her moving, work on strength, ROM and balance           Patient will benefit from skilled therapeutic intervention in order to improve the following deficits and impairments:  Abnormal gait,Decreased range of motion,Difficulty  walking,Increased muscle spasms,Decreased endurance,Cardiopulmonary status limiting activity,Decreased activity tolerance,Pain,Improper body mechanics,Impaired flexibility,Decreased balance,Decreased mobility,Decreased strength  Visit Diagnosis: Muscle weakness (generalized)  Pain in left hip  Difficulty in walking, not elsewhere classified     Problem List Patient Active Problem List   Diagnosis Date Noted  . Left leg weakness 04/05/2020  . S/P total hip arthroplasty 09/07/2018  . Degenerative joint disease of left hip 09/06/2018  . B12 deficiency 02/01/2018  . DDD (degenerative disc disease), lumbar 01/03/2018  . Hyponatremia 10/04/2017  . Thrombocytosis 10/04/2017  . Chronic respiratory failure with hypoxia ([place] 09/01/2017  . Dyslipidemia, goal LDL below 70 12/06/2016  . Aortic atherosclerosis ([facility] 12/06/2016  . Diverticulosis 04/11/2016  . Essential hypertension 10/03/2013  . Current smoker 10/03/2013  . Esophageal reflux 10/03/2013  . Anxiety disorder 10/03/2013  . Allergic rhinitis 10/03/2013  . Insomnia 07/17/2013    [name] 05/06/2020, 3:14 PM  [place] [address]Phone: [phone]  Fax:  [phone] Name: BFERNANDE TREIBERMRN: [id number] of Birth: [birth year]

## 2020-05-10 NOTE — Progress Notes (Signed)
[place] [address] [place] Phone: [phone] Subjective:   I Beverly [name] am serving as a Education administrator for Dr. Hulan Saas.  This visit occurred during the SARS-CoV-2 public health emergency.  Safety protocols were in place, including screening questions prior to the visit, additional usage of staff PPE, and extensive cleaning of exam room while observing appropriate contact time as indicated for disinfecting solutions.   I'm seeing this patient by the request  of:  Thomes Dinning, MD  CC: Left hip pain, low back pain and left leg weakness follow-up  VOZ:[id number]  Beverly [name] is a 70 y.o. female coming in with complaint of left hip pain neck pain and left leg weakness.  Patient felt like she never regained full function after her hip replacement.  Restarted formal physical therapy for balance and coordination.  X-rays of patient's left hip were taken at last exam showing no loosening or abnormality noted.  Patient is low back exam also having very mild scoliosis and mild degenerative disc disease.  Did have some endplate hypertrophic changes noted and some facet arthropathy. Patient states she is feeling good but does not feel like she is making progress. Believes her balance is off and her left leg has lost muscle mass. States she has been going to PT and signed up for silver sneakers.        Past Medical History:  Diagnosis Date  . Cancer [facility])    Skin cancer  . Hyperlipidemia   . Hypertension    Past Surgical History:  Procedure Laterality Date  . COLONOSCOPY    . SALPINGOOPHORECTOMY    . TOTAL HIP ARTHROPLASTY Left 09/06/2018   Procedure: TOTAL HIP ARTHROPLASTY;  Surgeon: Earlie Server, MD;  [facility];  Service: Orthopedics;  Laterality: Left;  . UTERINE ARTERY EMBOLIZATION     Social History   Socioeconomic History  . Marital status: Married    Spouse name: Not on file  . Number of children: 1  .  Years of education: Not on file  . Highest education level: Not on file  Occupational History  . Not on file  Tobacco Use  . Smoking status: Current Every Day Smoker    Packs/day: 0.50    Years: 40.00    Pack years: 20.00    Types: Cigarettes  . Smokeless tobacco: Never Used  Vaping Use  . Vaping Use: Never used  Substance and Sexual Activity  . Alcohol use: Yes    Comment: occ  . Drug use: Never  . Sexual activity: Not on file  Other Topics Concern  . Not on file  Social History Narrative  . Not on file   Social Determinants of Health   Financial Resource Strain: Not on file  Food Insecurity: Not on file  Transportation Needs: Not on file  Physical Activity: Not on file  Stress: Not on file  Social Connections: Not on file   No Known Allergies Family History  Problem Relation Age of Onset  . Heart attack Father     Current Outpatient Medications (Endocrine & Metabolic):  .  alendronate (FOSAMAX) 70 MG tablet, Take 70 mg by mouth once a week.   Current Outpatient Medications (Cardiovascular):  .  atorvastatin (LIPITOR) 20 MG tablet, Take 20 mg by mouth daily.  Marland Kitchen  ezetimibe (ZETIA) 10 MG tablet, Take 10 mg by mouth daily.  .  fenofibrate 160 MG tablet, Take 160 mg by mouth daily.  Marland Kitchen  losartan (COZAAR) 100 MG tablet, Take 100 mg by mouth daily.  .  metoprolol succinate (TOPROL-XL) 100 MG 24 hr tablet, Take 100 mg by mouth daily.   Current Outpatient Medications (Respiratory):  Marland Kitchen  ADVAIR HFA 45-21 MCG/ACT inhaler, Inhale 2 puffs into the lungs daily as needed (shortness of breath or wheezing).   Current Outpatient Medications (Analgesics):  .  acetaminophen (TYLENOL) 500 MG tablet, Take 500 mg by mouth every 6 (six) hours as needed for moderate pain.  .  celecoxib (CELEBREX) 200 MG capsule, Take 200 mg by mouth daily as needed for moderate pain.  Marland Kitchen  oxyCODONE (OXY IR/ROXICODONE) 5 MG immediate release tablet, Take one tab po q4-6hrs prn pain, may need 1-2 first  couple weeks .  traMADol (ULTRAM) 50 MG tablet, Take 50 mg by mouth every 6 (six) hours as needed for moderate pain.   Current Outpatient Medications (Hematological):  .  apixaban (ELIQUIS) 2.5 MG TABS tablet, Take 1 tablet (2.5 mg total) by mouth 2 (two) times daily. .  vitamin B-12 (CYANOCOBALAMIN) 1000 MCG tablet, Take 1,000 mcg by mouth daily.  Current Outpatient Medications (Other):  .  baclofen (LIORESAL) 10 MG tablet, Take 1 tablet (10 mg total) by mouth 3 (three) times daily as needed for muscle spasms. .  Cholecalciferol (VITAMIN D3) 50 MCG (2000 UT) TABS, Take 2,000 Units by mouth daily.  .  hydroxypropyl methylcellulose / hypromellose (ISOPTO TEARS / GONIOVISC) 2.5 % ophthalmic solution, Place 1 drop into both eyes 2 (two) times daily as needed for dry eyes. Marland Kitchen  venlafaxine XR (EFFEXOR-XR) 37.5 MG 24 hr capsule, Take 37.5 mg by mouth daily with breakfast.    Reviewed prior external information including notes and imaging from  primary care provider As well as notes that were available from care everywhere and other healthcare systems.  Past medical history, social, surgical and family history all reviewed in electronic medical record.  No pertanent information unless stated regarding to the chief complaint.   Review of Systems:  No headache, visual changes, nausea, vomiting, diarrhea, constipation, dizziness, abdominal pain, skin rash, fevers, chills, night sweats, weight loss, swollen lymph nodes, body aches, joint swelling, chest pain, shortness of breath, mood changes. POSITIVE muscle aches more concerned of the muscle weakness.  Objective  Blood pressure 130/80, pulse 87, height 5\' 2"  (1.575 m), weight 135 lb (61.2 kg), SpO2 99 %.   General: No apparent distress alert and oriented x3 mood and affect normal, dressed appropriately.  Minorly anxious HEENT: Pupils equal, extraocular movements intact  Respiratory: Patient's speak in full sentences and does not appear short of  breath  Cardiovascular: No lower extremity edema, non tender, no erythema  Gait mildly antalgic Patient's left leg does have some mild atrophy compared to the contralateral side.  Patient does have some mild tightness with FABER test on the left.  Patient does have a negative straight leg test but does have tenderness noted in the lower lumbar spine left greater than right.  Limited range of motion of the back noted.  Neurovascularly intact distally but patient does seem to have 4-5 strength of dorsiflexion of the foot compared to the contralateral side   Impression and Recommendations:     The above documentation has been reviewed and is accurate and complete Lyndal Pulley, DO

## 2020-05-11 ENCOUNTER — Other Ambulatory Visit: Payer: Self-pay

## 2020-05-11 ENCOUNTER — Encounter: Payer: Self-pay | Admitting: Family Medicine

## 2020-05-11 ENCOUNTER — Ambulatory Visit (INDEPENDENT_AMBULATORY_CARE_PROVIDER_SITE_OTHER): Payer: Medicare HMO | Admitting: Family Medicine

## 2020-05-11 VITALS — BP 130/80 | HR 87 | Ht 62.0 in | Wt 135.0 lb

## 2020-05-11 DIAGNOSIS — M545 Low back pain, unspecified: Secondary | ICD-10-CM

## 2020-05-11 DIAGNOSIS — Z96642 Presence of left artificial hip joint: Secondary | ICD-10-CM | POA: Diagnosis not present

## 2020-05-11 DIAGNOSIS — G8929 Other chronic pain: Secondary | ICD-10-CM

## 2020-05-11 DIAGNOSIS — R29898 Other symptoms and signs involving the musculoskeletal system: Secondary | ICD-10-CM

## 2020-05-11 DIAGNOSIS — M79605 Pain in left leg: Secondary | ICD-10-CM

## 2020-05-11 DIAGNOSIS — M5136 Other intervertebral disc degeneration, lumbar region: Secondary | ICD-10-CM | POA: Diagnosis not present

## 2020-05-11 NOTE — Assessment & Plan Note (Signed)
Patient continues to have weakness of the left leg.  Patient does have weakness noted.  This could be secondary to her hip replacements and we will get a bone scan to make sure there is no loosening.  Patient does not have instability just does not feel confident.  In addition to this I would like to get an MRI of the lumbar spine to further evaluate for any nerve root impingement that could be contributing as well.  Known degenerative disc disease of the lumbar spine noted.  In addition to this patient does have the weakness noted with some atrophy and I do think a nerve conduction study could be beneficial which will be ordered today.  Depending on these findings we can see how patient responds.  Patient will follow up with me again in 6 weeks but will make changes appropriately.  Patient has had a B12 deficiency and is following up with her primary care provider next month and held on any of the other laboratory work-up at this time.

## 2020-05-11 NOTE — Patient Instructions (Addendum)
Good to see you MRI lumbar Nerve conduction study of left leg Bone scan of left hip Continue PT See me again in 6 weeks I will follow with you on test

## 2020-05-11 NOTE — Assessment & Plan Note (Signed)
Patient does have weakness in the left leg and will get MRI of the lumbar spine as well as consider the possibility of the nerve conductions test of the left leg.  Patient did have the surgical intervention done and I do think further evaluation is worthwhile.  We will also get a bone scan to further evaluate for any type of loosening of the hip.  Depending on findings this can change medical management.

## 2020-05-12 ENCOUNTER — Encounter: Payer: Self-pay | Admitting: Neurology

## 2020-05-12 ENCOUNTER — Other Ambulatory Visit: Payer: Self-pay

## 2020-05-12 DIAGNOSIS — R202 Paresthesia of skin: Secondary | ICD-10-CM

## 2020-05-13 ENCOUNTER — Telehealth: Payer: Self-pay

## 2020-05-13 ENCOUNTER — Ambulatory Visit: Payer: Medicare HMO | Admitting: Physical Therapy

## 2020-05-13 NOTE — Telephone Encounter (Signed)
Called patient to give her the number to call for her bone scan. They will not call patient to schedule, the number is [phone] and this is done at the hospital.

## 2020-05-13 NOTE — Telephone Encounter (Signed)
Pt returned call, phone # given.

## 2020-05-17 ENCOUNTER — Ambulatory Visit (HOSPITAL_COMMUNITY)
Admission: RE | Admit: 2020-05-17 | Discharge: 2020-05-17 | Disposition: A | Discharge status: Home / Self Care | Payer: Medicare HMO | Source: Ambulatory Visit | Attending: Family Medicine | Admitting: Family Medicine

## 2020-05-17 ENCOUNTER — Other Ambulatory Visit: Payer: Self-pay

## 2020-05-17 DIAGNOSIS — M79605 Pain in left leg: Secondary | ICD-10-CM | POA: Diagnosis not present

## 2020-05-17 DIAGNOSIS — M25552 Pain in left hip: Secondary | ICD-10-CM | POA: Diagnosis not present

## 2020-05-17 MED ORDER — TECHNETIUM TC 99M MEDRONATE IV KIT
20.0000 | PACK | Freq: Once | INTRAVENOUS | Status: AC | PRN
  Administered 2020-05-17: 21.2 via INTRAVENOUS

## 2020-05-18 ENCOUNTER — Encounter: Payer: Self-pay | Admitting: Physical Therapy

## 2020-05-18 ENCOUNTER — Ambulatory Visit: Payer: Medicare HMO | Attending: Family Medicine | Admitting: Physical Therapy

## 2020-05-18 DIAGNOSIS — M25652 Stiffness of left hip, not elsewhere classified: Secondary | ICD-10-CM

## 2020-05-18 DIAGNOSIS — R2681 Unsteadiness on feet: Secondary | ICD-10-CM

## 2020-05-18 DIAGNOSIS — M6281 Muscle weakness (generalized): Secondary | ICD-10-CM

## 2020-05-18 DIAGNOSIS — M25552 Pain in left hip: Secondary | ICD-10-CM

## 2020-05-18 DIAGNOSIS — M545 Low back pain, unspecified: Secondary | ICD-10-CM | POA: Diagnosis not present

## 2020-05-18 DIAGNOSIS — G8929 Other chronic pain: Secondary | ICD-10-CM | POA: Diagnosis not present

## 2020-05-18 DIAGNOSIS — R262 Difficulty in walking, not elsewhere classified: Secondary | ICD-10-CM | POA: Diagnosis not present

## 2020-05-18 NOTE — Therapy (Signed)
[place]. [place], [place], [address] Phone: [phone]   Fax:  [phone]  Physical Therapy Treatment  Patient Details  Name: Beverly [name] MRN: [id number] Date of Birth: 12-Sep-1950 Referring Provider (PT): Creig Hines   Encounter Date: 05/18/2020   PT End of Session - 05/18/20 1427    Visit Number 7    Date for PT Re-Evaluation 06/10/20    Authorization Type Aetna Medicare    PT Start Time 1344    PT Stop Time 1427    PT Time Calculation (min) 43 min    Activity Tolerance Patient tolerated treatment well    Behavior During Therapy [facility] for tasks assessed/performed           Past Medical History:  Diagnosis Date  . Cancer [facility])    Skin cancer  . Hyperlipidemia   . Hypertension     Past Surgical History:  Procedure Laterality Date  . COLONOSCOPY    . SALPINGOOPHORECTOMY    . TOTAL HIP ARTHROPLASTY Left 09/06/2018   Procedure: TOTAL HIP ARTHROPLASTY;  Surgeon: Earlie Server, MD;  [facility];  Service: Orthopedics;  Laterality: Left;  . UTERINE ARTERY EMBOLIZATION      There were no vitals filed for this visit.   Subjective Assessment - 05/18/20 1349    Subjective Pt reports a fall in the shower Sunday, pt reports closing her eyes (soap in eyes) turned and lost her balance. Reports a bruise on her LLE, L arm feel like she had a flue shot in it. Pt reports hitting her head and has a knot on it. Today "I feel good"    Currently in Pain? No/denies                             [facility] Adult PT Treatment/Exercise - 05/18/20 0001      High Level Balance   High Level Balance Comments Eyes closed with circle turns, eyes closed on airex, side step on and off airex, Standing marches with eyes closed      Knee/Hip Exercises: Aerobic   Recumbent Bike L2 x 3 min    Nustep L5 x 4 min LE only      Knee/Hip Exercises: Machines for Strengthening   Cybex Knee Extension 5lb 2x12     Cybex Knee Flexion 20lb 2x12      Knee/Hip Exercises: Standing   Forward Step Up Both;2 sets;5 reps;Hand Hold: 0;Step Height: 6"    Walking with Sports Cord 30lb forwd/bak x 5 each                    PT Short Term Goals - 04/20/20 1512      PT SHORT TERM GOAL #1   Title Ind with inital HEP    Status Partially Met             PT Long Term Goals - 05/18/20 1430      PT LONG TERM GOAL #1   Title Patient to report decreased pain in her left hip by 50% or more with ADLs.    Status Partially Met      PT LONG TERM GOAL #2   Title Patient able to ambulate with a normal gait pattern with pain <= 2/10.    Status Partially Met                 Plan - 05/18/20 1427  Clinical Impression Statement Fall reported Sunday, turing in the shower with her eyes closed. Pt had a lot of difficulty with eyes closed on airex. Balance interventions performed with eyes closes. small cautious steps noted with backwards walking. Some instability with small circles with eyes closed.    Stability/Clinical Decision Making Stable/Uncomplicated    Rehab Potential Good    PT Frequency 2x / week    PT Duration 8 weeks    PT Treatment/Interventions ADLs/Self Care Home Management;Electrical Stimulation;Iontophoresis 43m/ml Dexamethasone;Moist Heat;Gait training;Neuromuscular re-education;Balance training;Therapeutic exercise;Therapeutic activities;Functional mobility training;Stair training;Patient/family education;Manual techniques    PT Next Visit Plan get her moving, work on strength, ROM and balance           Patient will benefit from skilled therapeutic intervention in order to improve the following deficits and impairments:  Abnormal gait,Decreased range of motion,Difficulty walking,Increased muscle spasms,Decreased endurance,Cardiopulmonary status limiting activity,Decreased activity tolerance,Pain,Improper body mechanics,Impaired flexibility,Decreased balance,Decreased  mobility,Decreased strength  Visit Diagnosis: Difficulty in walking, not elsewhere classified  Stiffness of left hip, not elsewhere classified  Pain in left hip  Muscle weakness (generalized)  Unsteadiness on feet     Problem List Patient Active Problem List   Diagnosis Date Noted  . Left leg weakness 04/05/2020  . S/P total hip arthroplasty 09/07/2018  . Degenerative joint disease of left hip 09/06/2018  . B12 deficiency 02/01/2018  . DDD (degenerative disc disease), lumbar 01/03/2018  . Hyponatremia 10/04/2017  . Thrombocytosis 10/04/2017  . Chronic respiratory failure with hypoxia ([place] 09/01/2017  . Dyslipidemia, goal LDL below 70 12/06/2016  . Aortic atherosclerosis ([place] 12/06/2016  . Diverticulosis 04/11/2016  . Essential hypertension 10/03/2013  . Current smoker 10/03/2013  . Esophageal reflux 10/03/2013  . Anxiety disorder 10/03/2013  . Allergic rhinitis 10/03/2013  . Insomnia 07/17/2013    [name] 05/18/2020, 2:30 PM  [place] [address]Phone: [phone]  Fax:  [phone] Name: BCOCO SHARPNACKMRN: [id number] of Birth: 712/08/1950

## 2020-05-19 ENCOUNTER — Other Ambulatory Visit: Payer: Self-pay

## 2020-05-19 ENCOUNTER — Encounter: Payer: Self-pay | Admitting: Physical Therapy

## 2020-05-19 ENCOUNTER — Ambulatory Visit: Payer: Medicare HMO | Admitting: Physical Therapy

## 2020-05-19 DIAGNOSIS — M6281 Muscle weakness (generalized): Secondary | ICD-10-CM

## 2020-05-19 DIAGNOSIS — G8929 Other chronic pain: Secondary | ICD-10-CM | POA: Diagnosis not present

## 2020-05-19 DIAGNOSIS — M25652 Stiffness of left hip, not elsewhere classified: Secondary | ICD-10-CM | POA: Diagnosis not present

## 2020-05-19 DIAGNOSIS — R262 Difficulty in walking, not elsewhere classified: Secondary | ICD-10-CM | POA: Diagnosis not present

## 2020-05-19 DIAGNOSIS — M25552 Pain in left hip: Secondary | ICD-10-CM

## 2020-05-19 DIAGNOSIS — R2681 Unsteadiness on feet: Secondary | ICD-10-CM | POA: Diagnosis not present

## 2020-05-19 DIAGNOSIS — M545 Low back pain, unspecified: Secondary | ICD-10-CM | POA: Diagnosis not present

## 2020-05-19 NOTE — Therapy (Signed)
[address]. [place], [place], [address] Phone: [phone]   Fax:  [phone]  Physical Therapy Treatment  Patient Details  Name: Beverly [name] MRN: [id number] Date of Birth: December 26, 1950 Referring Provider (PT): Beverly [name]   Encounter Date: 05/19/2020   PT End of Session - 05/19/20 1427    Visit Number 8    Date for PT Re-Evaluation 06/10/20    Authorization Type [employer]na Medicare    PT Start Time 3220    PT Stop Time 1427    PT Time Calculation (min) 42 min    Activity Tolerance Patient tolerated treatment well    Behavior During Therapy Beverly [name] for tasks assessed/performed           Past Medical History:  Diagnosis Date  . Cancer Beverly [name])    Skin cancer  . Hyperlipidemia   . Hypertension     Past Surgical History:  Procedure Laterality Date  . COLONOSCOPY    . SALPINGOOPHORECTOMY    . TOTAL HIP ARTHROPLASTY Left 09/06/2018   Procedure: TOTAL HIP ARTHROPLASTY;  Surgeon: Beverly Server, MD;  Location: [facility];  Service: Orthopedics;  Laterality: Left;  . UTERINE ARTERY EMBOLIZATION      There were no vitals filed for this visit.   Subjective Assessment - 05/19/20 1350    Subjective "Good, could have slept a little longer"    Currently in Pain? No/denies                             [facility] Adult PT Treatment/Exercise - 05/19/20 0001      Ambulation/Gait   Stairs Yes    Stairs Assistance 5: Supervision    Number of Stairs 9    Height of Stairs 4    Gait Comments working on without rails,      High Level Balance   High Level Balance Comments eyes closed on airex 5x10 sec      Knee/Hip Exercises: Aerobic   Recumbent Bike L2 x 3 min    Nustep L5 x 5 min LE only      Knee/Hip Exercises: Machines for Strengthening   Cybex Leg Press 20lb 2x10      Knee/Hip Exercises: Standing   Forward Step Up Both;1 set;10 reps;Hand Hold: 0;Step Height: 6"    Step Down Both;2 sets;5 sets;Hand Hold:  1;Hand Hold: 0;Step Height: 4"    Walking with Sports Cord 10lb resisted side step over dowl rod x10 each    Other Standing Knee Exercises Rows & Ext green standing on airex 2x10      Knee/Hip Exercises: Seated   Sit to Sand 2 sets;10 reps;without UE support   LE on airex                   PT Short Term Goals - 04/20/20 1512      PT SHORT TERM GOAL #1   Title Ind with inital HEP    Status Partially Met             PT Long Term Goals - 05/18/20 1430      PT LONG TERM GOAL #1   Title Patient to report decreased pain in her left hip by 50% or more with ADLs.    Status Partially Met      PT LONG TERM GOAL #2   Title Patient able to ambulate with a normal gait pattern with pain <= 2/10.  Status Partially Met                 Plan - 05/19/20 1428    Clinical Impression Statement All interventions completed well. Pt reports that she would like to do stairs without rails, she is very hesitant descending stairs were she does have LLE eccentric load weakness. Some instability with resisted side steps. Eyes closed on noncompliant surfaces are very difficult for PR.    Stability/Clinical Decision Making Stable/Uncomplicated    Rehab Potential Good    PT Frequency 2x / week    PT Duration 8 weeks    PT Treatment/Interventions ADLs/Self Care Home Management;Electrical Stimulation;Iontophoresis 17m/ml Dexamethasone;Moist Heat;Gait training;Neuromuscular re-education;Balance training;Therapeutic exercise;Therapeutic activities;Functional mobility training;Stair training;Patient/family education;Manual techniques    PT Next Visit Plan get her moving, work on strength, ROM and balance, eyes closed interventions           Patient will benefit from skilled therapeutic intervention in order to improve the following deficits and impairments:  Abnormal gait,Decreased range of motion,Difficulty walking,Increased muscle spasms,Decreased endurance,Cardiopulmonary status limiting  activity,Decreased activity tolerance,Pain,Improper body mechanics,Impaired flexibility,Decreased balance,Decreased mobility,Decreased strength  Visit Diagnosis: Stiffness of left hip, not elsewhere classified  Pain in left hip  Muscle weakness (generalized)  Difficulty in walking, not elsewhere classified     Problem List Patient Active Problem List   Diagnosis Date Noted  . Left leg weakness 04/05/2020  . S/P total hip arthroplasty 09/07/2018  . Degenerative joint disease of left hip 09/06/2018  . B12 deficiency 02/01/2018  . DDD (degenerative disc disease), lumbar 01/03/2018  . Hyponatremia 10/04/2017  . Thrombocytosis 10/04/2017  . Chronic respiratory failure with hypoxia (Beverly [name] 09/01/2017  . Dyslipidemia, goal LDL below 70 12/06/2016  . Aortic atherosclerosis ([place] 12/06/2016  . Diverticulosis 04/11/2016  . Essential hypertension 10/03/2013  . Current smoker 10/03/2013  . Esophageal reflux 10/03/2013  . Anxiety disorder 10/03/2013  . Allergic rhinitis 10/03/2013  . Insomnia 07/17/2013    [name] 05/19/2020, 2:34 PM  [place] [address]Phone: [phone]  Fax:  [phone] Name: Beverly BONESMRN: [id number] of Birth: 709-Jul-1952

## 2020-05-27 ENCOUNTER — Encounter: Payer: Self-pay | Admitting: Physical Therapy

## 2020-05-27 ENCOUNTER — Ambulatory Visit: Payer: Medicare HMO | Admitting: Physical Therapy

## 2020-05-27 ENCOUNTER — Other Ambulatory Visit: Payer: Self-pay

## 2020-05-27 DIAGNOSIS — M6281 Muscle weakness (generalized): Secondary | ICD-10-CM | POA: Diagnosis not present

## 2020-05-27 DIAGNOSIS — M25552 Pain in left hip: Secondary | ICD-10-CM | POA: Diagnosis not present

## 2020-05-27 DIAGNOSIS — R2681 Unsteadiness on feet: Secondary | ICD-10-CM | POA: Diagnosis not present

## 2020-05-27 DIAGNOSIS — M545 Low back pain, unspecified: Secondary | ICD-10-CM | POA: Diagnosis not present

## 2020-05-27 DIAGNOSIS — G8929 Other chronic pain: Secondary | ICD-10-CM | POA: Diagnosis not present

## 2020-05-27 DIAGNOSIS — M25652 Stiffness of left hip, not elsewhere classified: Secondary | ICD-10-CM | POA: Diagnosis not present

## 2020-05-27 DIAGNOSIS — R262 Difficulty in walking, not elsewhere classified: Secondary | ICD-10-CM | POA: Diagnosis not present

## 2020-05-27 NOTE — Therapy (Signed)
[address]. [place], [place], [address] Phone: [phone]   Fax:  [phone]  Physical Therapy Treatment  Patient Details  Name: Beverly [name] MRN: [id number] Date of Birth: [birth year] Referring Provider (PT): Creig Hines   Encounter Date: 05/27/2020   PT End of Session - 05/27/20 1555    Visit Number 9    Date for PT Re-Evaluation 06/10/20    Authorization Type A[employer]na Medicare    PT Start Time 1515    PT Stop Time 1557    PT Time Calculation (min) 42 min           Past Medical History:  Diagnosis Date  . Cancer [facility])    Skin cancer  . Hyperlipidemia   . Hypertension     Past Surgical History:  Procedure Laterality Date  . COLONOSCOPY    . SALPINGOOPHORECTOMY    . TOTAL HIP ARTHROPLASTY Left 09/06/2018   Procedure: TOTAL HIP ARTHROPLASTY;  Surgeon: Earlie Server, MD;  Location: [facility];  Service: Orthopedics;  Laterality: Left;  . UTERINE ARTERY EMBOLIZATION      There were no vitals filed for this visit.   Subjective Assessment - 05/27/20 1517    Subjective OK, Pt reports a bruise on her L leg from the fall in the shower a few weeks ago    Currently in Pain? No/denies    Pain Score 1     Pain Location Leg    Pain Orientation Left;Distal              [facility] PT Assessment - 05/27/20 0001      Berg Balance Test   Sit to Stand Able to stand without using hands and stabilize independently    Standing Unsupported Able to stand safely 2 minutes    Sitting with Back Unsupported but Feet Supported on Floor or Stool Able to sit safely and securely 2 minutes    Stand to Sit Sits safely with minimal use of hands    Transfers Able to transfer safely, minor use of hands    Standing Unsupported with Eyes Closed Able to stand 10 seconds safely    Standing Unsupported with Feet Together Able to place feet together independently and stand 1 minute safely    From Standing, Reach Forward with Outstretched  Arm Can reach forward >12 cm safely (5")    From Standing Position, Pick up Object from Floor Able to pick up shoe safely and easily    From Standing Position, Turn to Look Behind Over each Shoulder Looks behind from both sides and weight shifts well    Turn 360 Degrees Able to turn 360 degrees safely one side only in 4 seconds or less    Standing Unsupported, Alternately Place Feet on Step/Stool Able to stand independently and complete 8 steps >20 seconds    Standing Unsupported, One Foot in Front Able to plae foot ahead of the other independently and hold 30 seconds    Standing on One Leg Able to lift leg independently and hold equal to or more than 3 seconds    Total Score 50                         [facility] Adult PT Treatment/Exercise - 05/27/20 0001      Neuro Re-ed    Neuro Re-ed Details  Airex standing eyes closed 5x 10 seconds      Knee/Hip Exercises: Aerobic  Recumbent Bike L2 x 3 min    Nustep L5 x 6 min LE only      Knee/Hip Exercises: Machines for Strengthening   Cybex Knee Extension 10lb 2x10    Cybex Knee Flexion 25lb 2x10    Cybex Leg Press 30lb 2x10      Knee/Hip Exercises: Standing   Other Standing Knee Exercises Rows & Ext green standing on airex 2x10      Knee/Hip Exercises: Seated   Sit to Sand 2 sets;10 reps;without UE support   LE on airex                   PT Short Term Goals - 04/20/20 1512      PT SHORT TERM GOAL #1   Title Ind with inital HEP    Status Partially Met             PT Long Term Goals - 05/27/20 1535      PT LONG TERM GOAL #5   Title increase Berg balance score to 49/56    Status Achieved                 Plan - 05/27/20 1556    Clinical Impression Statement Pt has progressed increasing her BERG balance score meeting goal. Pt has also increased her tolerance with standing on airex with eyes closes. Difficulty noted with SL and tandem stance. LE clearance issues with alt box taps. No increase in  pain. Cues for full ROM on leg press.    Stability/Clinical Decision Making Stable/Uncomplicated    Rehab Potential Good    PT Frequency 2x / week    PT Duration 8 weeks    PT Treatment/Interventions ADLs/Self Care Home Management;Electrical Stimulation;Iontophoresis 19m/ml Dexamethasone;Moist Heat;Gait training;Neuromuscular re-education;Balance training;Therapeutic exercise;Therapeutic activities;Functional mobility training;Stair training;Patient/family education;Manual techniques    PT Next Visit Plan get her moving, work on strength, ROM and balance, eyes closed interventions           Patient will benefit from skilled therapeutic intervention in order to improve the following deficits and impairments:  Abnormal gait,Decreased range of motion,Difficulty walking,Increased muscle spasms,Decreased endurance,Cardiopulmonary status limiting activity,Decreased activity tolerance,Pain,Improper body mechanics,Impaired flexibility,Decreased balance,Decreased mobility,Decreased strength  Visit Diagnosis: Stiffness of left hip, not elsewhere classified  Pain in left hip  Difficulty in walking, not elsewhere classified  Unsteadiness on feet  Muscle weakness (generalized)     Problem List Patient Active Problem List   Diagnosis Date Noted  . Left leg weakness 04/05/2020  . S/P total hip arthroplasty 09/07/2018  . Degenerative joint disease of left hip 09/06/2018  . B12 deficiency 02/01/2018  . DDD (degenerative disc disease), lumbar 01/03/2018  . Hyponatremia 10/04/2017  . Thrombocytosis 10/04/2017  . Chronic respiratory failure with hypoxia ([place] 09/01/2017  . Dyslipidemia, goal LDL below 70 12/06/2016  . Aortic atherosclerosis ([facility] 12/06/2016  . Diverticulosis 04/11/2016  . Essential hypertension 10/03/2013  . Current smoker 10/03/2013  . Esophageal reflux 10/03/2013  . Anxiety disorder 10/03/2013  . Allergic rhinitis 10/03/2013  . Insomnia 07/17/2013    [name] 05/27/2020, 3:58 PM  [place] [address]Phone: [phone]  Fax:  [phone] Name: Beverly MCEUENMRN: [id number] of Birth: 705/01/1951

## 2020-05-31 ENCOUNTER — Other Ambulatory Visit: Payer: Self-pay

## 2020-05-31 ENCOUNTER — Ambulatory Visit
Admission: RE | Admit: 2020-05-31 | Discharge: 2020-05-31 | Disposition: A | Discharge status: Home / Self Care | Payer: Medicare HMO | Source: Ambulatory Visit | Attending: Family Medicine | Admitting: Family Medicine

## 2020-05-31 DIAGNOSIS — M545 Low back pain, unspecified: Secondary | ICD-10-CM

## 2020-05-31 DIAGNOSIS — M48061 Spinal stenosis, lumbar region without neurogenic claudication: Secondary | ICD-10-CM | POA: Diagnosis not present

## 2020-05-31 DIAGNOSIS — G8929 Other chronic pain: Secondary | ICD-10-CM

## 2020-06-02 ENCOUNTER — Other Ambulatory Visit: Payer: Self-pay

## 2020-06-02 ENCOUNTER — Ambulatory Visit: Payer: Medicare HMO

## 2020-06-02 DIAGNOSIS — M545 Low back pain, unspecified: Secondary | ICD-10-CM | POA: Diagnosis not present

## 2020-06-02 DIAGNOSIS — R262 Difficulty in walking, not elsewhere classified: Secondary | ICD-10-CM | POA: Diagnosis not present

## 2020-06-02 DIAGNOSIS — M25552 Pain in left hip: Secondary | ICD-10-CM | POA: Diagnosis not present

## 2020-06-02 DIAGNOSIS — M25652 Stiffness of left hip, not elsewhere classified: Secondary | ICD-10-CM | POA: Diagnosis not present

## 2020-06-02 DIAGNOSIS — M6281 Muscle weakness (generalized): Secondary | ICD-10-CM

## 2020-06-02 DIAGNOSIS — R2681 Unsteadiness on feet: Secondary | ICD-10-CM | POA: Diagnosis not present

## 2020-06-02 DIAGNOSIS — G8929 Other chronic pain: Secondary | ICD-10-CM | POA: Diagnosis not present

## 2020-06-02 NOTE — Therapy (Signed)
[address]. [place], [place], [address] Phone: [phone]   Fax:  [phone]  Physical Therapy Treatment 10th visit Progress Note Reporting Period 04/12/20  to 06/02/2020  See note below for Objective Data and Assessment of Progress/Goals.      Patient Details  Name: Beverly [name] MRN: [id number] Date of Birth: [birth year] Referring Provider (PT): Creig Hines   Encounter Date: 06/02/2020   PT End of Session - 06/02/20 1651    Visit Number 10    Date for PT Re-Evaluation 06/10/20    Authorization Type Aetna Medicare    PT Start Time 1615    PT Stop Time 1632    PT Time Calculation (min) 17 min    Activity Tolerance Patient tolerated treatment well    Behavior During Therapy [facility] for tasks assessed/performed           Past Medical History:  Diagnosis Date  . Cancer [facility])    Skin cancer  . Hyperlipidemia   . Hypertension     Past Surgical History:  Procedure Laterality Date  . COLONOSCOPY    . SALPINGOOPHORECTOMY    . TOTAL HIP ARTHROPLASTY Left 09/06/2018   Procedure: TOTAL HIP ARTHROPLASTY;  Surgeon: Earlie Server, MD;  Location[facility];  Service: Orthopedics;  Laterality: Left;  . UTERINE ARTERY EMBOLIZATION      There were no vitals filed for this visit.   Subjective Assessment - 06/02/20 1615    Subjective Continues to not have much pain other than a little stiffness in the hip in the morning. Had an MRI a few days ago, read results on mychart and had some questions about what it meant. Feels like she doesnt have strength in the left leg still. Balance especially with Stairs continues to be challenging, feels like she is shuffling when walking.    Limitations Lifting;Walking;House hold activities    Diagnostic tests 05/31/20: Lumbar MRI IMPRESSION:  Lumbar scoliosis. Multilevel degenerative change throughout the  lumbar spine causing spinal and foraminal stenosis as above.     Negative for  fracture.    Patient Stated Goals "be normal again" "feel stonger" go up and down stairs better, get up from the floor better and easier    Currently in Pain? No/denies              [facility] PT Assessment - 06/02/20 0001      Strength   Left Hip Flexion 4-/5    Left Hip ABduction 4-/5    Left Knee Flexion 4-/5    Left Knee Extension 4-/5             [facility] Adult PT Treatment/Exercise - 06/02/20 0001      Ambulation/Gait   Stairs Yes    Stairs Assistance 5: Supervision    Stair Management Technique --   1 HR with alt pattern no LOB. no HR with decr speed, intermittent step to pattern with instability, 1 LOB.   Number of Stairs --   4" steps x 8, 6" steps x6.     Knee/Hip Exercises: Seated   Sit to Sand 2 sets;10 reps;without UE support   2 sets with focus on eccentric control/lowering to seat            PT Short Term Goals - 04/20/20 1512      PT SHORT TERM GOAL #1   Title Ind with inital HEP    Status Partially Met  PT Long Term Goals - 06/02/20 1620      PT LONG TERM GOAL #1   Title Patient to report decreased pain in her left hip by 50% or more with ADLs.    Time 8    Period Weeks    Status Achieved      PT LONG TERM GOAL #2   Title Patient able to ambulate with a normal gait pattern with pain <= 2/10.    Time 8    Period Weeks    Status Partially Met   06/02/20: feels like thighs ache when walking, feels like she is shuffling     PT LONG TERM GOAL #3   Title Patient to demo 4+ to 5/5 BLE strength to ease ADLs.     Time 8    Period Weeks    Status On-going   Grossly improvedto 4-/5 LLE.     PT LONG TERM GOAL #4   Title go up and down stairs reciprocally    Time 8    Period Weeks    Status Partially Met   06/02/20: Reciprocal with 1 UE support. intermittent reciprocal with primary step to pattern without UE support with 1 LOB     PT LONG TERM GOAL #5   Title increase Berg balance score to 49/56    Time 8    Period Weeks    Status  Achieved   50/56                Plan - 06/02/20 1627    Clinical Impression Statement Today's session was abbreviated to 17 minutes secondary to concerns about dogs at home with the [facility] Warning that was sent out while she was in the clinic. Bennie reports she is no longer getting hip pain but continues to have primary concerns of decreased balance and strength. She has partially met STG for HEP, and is making progress toward LTGs. LLE strength has improved slightly since initial evaluation but with continued weakness, shakiness. She demonstrates good stability with alternating pattern on stairs when using HR however without HR balance confidence decreases and she demonstrates instability an decreased eccentric control, 1 LOB today. She will benefit from continued skilled PT to address functional strength and balance.   Rehab Potential Good    PT Frequency 2x / week    PT Duration 8 weeks    PT Treatment/Interventions ADLs/Self Care Home Management;Electrical Stimulation;Iontophoresis 50m/ml Dexamethasone;Moist Heat;Gait training;Neuromuscular re-education;Balance training;Therapeutic exercise;Therapeutic activities;Functional mobility training;Stair training;Patient/family education;Manual techniques    PT Next Visit Plan get her moving, work on strength, ROM and balance, eccentric control, eyes closed interventions    Consulted and Agree with Plan of Care Patient           Patient will benefit from skilled therapeutic intervention in order to improve the following deficits and impairments:  Abnormal gait,Decreased range of motion,Difficulty walking,Increased muscle spasms,Decreased endurance,Cardiopulmonary status limiting activity,Decreased activity tolerance,Pain,Improper body mechanics,Impaired flexibility,Decreased balance,Decreased mobility,Decreased strength  Visit Diagnosis: Stiffness of left hip, not elsewhere classified  Pain in left hip  Difficulty in walking, not  elsewhere classified  Unsteadiness on feet  Chronic bilateral low back pain, unspecified whether sciatica present  Muscle weakness (generalized)     Problem List Patient Active Problem List   Diagnosis Date Noted  . Left leg weakness 04/05/2020  . S/P total hip arthroplasty 09/07/2018  . Degenerative joint disease of left hip 09/06/2018  . B12 deficiency 02/01/2018  . DDD (degenerative disc disease), lumbar 01/03/2018  . Hyponatremia  10/04/2017  . Thrombocytosis 10/04/2017  . Chronic respiratory failure with hypoxia ([place]) 09/01/2017  . Dyslipidemia, goal LDL below 70 12/06/2016  . Aortic atherosclerosis ([place]) 12/06/2016  . Diverticulosis 04/11/2016  . Essential hypertension 10/03/2013  . Current smoker 10/03/2013  . Esophageal reflux 10/03/2013  . Anxiety disorder 10/03/2013  . Allergic rhinitis 10/03/2013  . Insomnia 07/17/2013    Hall Busing, PT, DPT 06/02/2020, 4:59 PM  [place]. [place], [place], [address] Phone: [phone]   Fax:  [phone]  Name: SHANTINA CHRONISTER MRN: [id number] Date of Birth: 1950/04/11

## 2020-06-09 ENCOUNTER — Ambulatory Visit: Payer: Medicare HMO | Admitting: Physical Therapy

## 2020-06-09 ENCOUNTER — Encounter: Payer: Self-pay | Admitting: Physical Therapy

## 2020-06-09 ENCOUNTER — Other Ambulatory Visit: Payer: Self-pay

## 2020-06-09 DIAGNOSIS — R2681 Unsteadiness on feet: Secondary | ICD-10-CM

## 2020-06-09 DIAGNOSIS — M25652 Stiffness of left hip, not elsewhere classified: Secondary | ICD-10-CM

## 2020-06-09 DIAGNOSIS — M6281 Muscle weakness (generalized): Secondary | ICD-10-CM | POA: Diagnosis not present

## 2020-06-09 DIAGNOSIS — M545 Low back pain, unspecified: Secondary | ICD-10-CM | POA: Diagnosis not present

## 2020-06-09 DIAGNOSIS — G8929 Other chronic pain: Secondary | ICD-10-CM | POA: Diagnosis not present

## 2020-06-09 DIAGNOSIS — R262 Difficulty in walking, not elsewhere classified: Secondary | ICD-10-CM

## 2020-06-09 DIAGNOSIS — M25552 Pain in left hip: Secondary | ICD-10-CM | POA: Diagnosis not present

## 2020-06-09 NOTE — Therapy (Signed)
[address]. [place], [place], [address] Phone: [phone]   Fax:  [phone]  Physical Therapy Treatment  Patient Details  Name: Beverly [name] MRN: [id number] Date of Birth: [birth year] Referring Provider (PT): Creig Hines   Encounter Date: 06/09/2020   PT End of Session - 06/09/20 1557    Visit Number 11    Date for PT Re-Evaluation 06/10/20    Authorization Type Aetna Medicare    PT Start Time 0321    PT Stop Time 1559    PT Time Calculation (min) 44 min    Activity Tolerance Patient tolerated treatment well    Behavior During Therapy [facility] for tasks assessed/performed           Past Medical History:  Diagnosis Date  . Cancer [facility])    Skin cancer  . Hyperlipidemia   . Hypertension     Past Surgical History:  Procedure Laterality Date  . COLONOSCOPY    . SALPINGOOPHORECTOMY    . TOTAL HIP ARTHROPLASTY Left 09/06/2018   Procedure: TOTAL HIP ARTHROPLASTY;  Surgeon: Earlie Server, MD;  [facility]: [facility];  Service: Orthopedics;  Laterality: Left;  . UTERINE ARTERY EMBOLIZATION      There were no vitals filed for this visit.   Subjective Assessment - 06/09/20 1516    Subjective "Good"    Pain Score 0-No pain                             [facility] Adult PT Treatment/Exercise - 06/09/20 0001      Neuro Re-ed    Neuro Re-ed Details  Airex standing eyes closed 5x 10 seconds      Knee/Hip Exercises: Aerobic   Nustep L5 x 6 min LE only      Knee/Hip Exercises: Machines for Strengthening   Cybex Knee Extension SL 5lb x10  then 2x5    Cybex Knee Flexion 25lb x10, SL 15lb x 10 each    Cybex Leg Press 40lb 2x10    Other Machine Rows & Lats 20lb 2x10      Knee/Hip Exercises: Standing   Forward Step Up Both;1 set;5 sets;Hand Hold: 1;Step Height: 8"                    PT Short Term Goals - 04/20/20 1512      PT SHORT TERM GOAL #1   Title Ind with inital HEP    Status  Partially Met             PT Long Term Goals - 06/02/20 1620      PT LONG TERM GOAL #1   Title Patient to report decreased pain in her left hip by 50% or more with ADLs.    Time 8    Period Weeks    Status Achieved      PT LONG TERM GOAL #2   Title Patient able to ambulate with a normal gait pattern with pain <= 2/10.    Time 8    Period Weeks    Status Partially Met   06/02/20: feels like thighs ache when walking, feels like she is shuffling     PT LONG TERM GOAL #3   Title Patient to demo 4+ to 5/5 BLE strength to ease ADLs.     Time 8    Period Weeks    Status On-going   Grossly improvedto 4-/5 LLE.  PT LONG TERM GOAL #4   Title go up and down stairs reciprocally    Time 8    Period Weeks    Status Partially Met   06/02/20: Reciprocal with 1 UE support. intermittent reciprocal with primary step to pattern without UE support with 1 LOB     PT LONG TERM GOAL #5   Title increase Berg balance score to 49/56    Time 8    Period Weeks    Status Achieved   50/56                Plan - 06/09/20 1559    Clinical Impression Statement Pt did well with the addition of postural strengthening. Reports MRI showed some DDD at multiple levels. Progressed to 8'' step ups with a little compensation on L side. Increase resistance tolerated on leg press. Visible LE shaking with SL extensions. Some shakiness noted with airex closes on aires.    Stability/Clinical Decision Making Stable/Uncomplicated    Rehab Potential Good    PT Frequency 2x / week    PT Duration 8 weeks    PT Treatment/Interventions ADLs/Self Care Home Management;Electrical Stimulation;Iontophoresis 29m/ml Dexamethasone;Moist Heat;Gait training;Neuromuscular re-education;Balance training;Therapeutic exercise;Therapeutic activities;Functional mobility training;Stair training;Patient/family education;Manual techniques    PT Next Visit Plan get her moving, work on strength, ROM and balance, eyes closed interventions            Patient will benefit from skilled therapeutic intervention in order to improve the following deficits and impairments:  Abnormal gait,Decreased range of motion,Difficulty walking,Increased muscle spasms,Decreased endurance,Cardiopulmonary status limiting activity,Decreased activity tolerance,Pain,Improper body mechanics,Impaired flexibility,Decreased balance,Decreased mobility,Decreased strength  Visit Diagnosis: Stiffness of left hip, not elsewhere classified  Pain in left hip  Difficulty in walking, not elsewhere classified  Unsteadiness on feet     Problem List Patient Active Problem List   Diagnosis Date Noted  . Left leg weakness 04/05/2020  . S/P total hip arthroplasty 09/07/2018  . Degenerative joint disease of left hip 09/06/2018  . B12 deficiency 02/01/2018  . DDD (degenerative disc disease), lumbar 01/03/2018  . Hyponatremia 10/04/2017  . Thrombocytosis 10/04/2017  . Chronic respiratory failure with hypoxia ([place] 09/01/2017  . Dyslipidemia, goal LDL below 70 12/06/2016  . Aortic atherosclerosis ([place] 12/06/2016  . Diverticulosis 04/11/2016  . Essential hypertension 10/03/2013  . Current smoker 10/03/2013  . Esophageal reflux 10/03/2013  . Anxiety disorder 10/03/2013  . Allergic rhinitis 10/03/2013  . Insomnia 07/17/2013    [name] 06/09/2020, 4:01 PM  [place] [address]Phone: [phone]  Fax:  [phone] Name: [name]

## 2020-06-14 ENCOUNTER — Ambulatory Visit: Payer: Medicare HMO | Attending: Family Medicine | Admitting: Physical Therapy

## 2020-06-14 ENCOUNTER — Encounter: Payer: Self-pay | Admitting: Physical Therapy

## 2020-06-14 ENCOUNTER — Other Ambulatory Visit: Payer: Self-pay

## 2020-06-14 DIAGNOSIS — M25552 Pain in left hip: Secondary | ICD-10-CM | POA: Insufficient documentation

## 2020-06-14 DIAGNOSIS — M25652 Stiffness of left hip, not elsewhere classified: Secondary | ICD-10-CM | POA: Insufficient documentation

## 2020-06-14 DIAGNOSIS — R262 Difficulty in walking, not elsewhere classified: Secondary | ICD-10-CM | POA: Diagnosis not present

## 2020-06-14 DIAGNOSIS — R2681 Unsteadiness on feet: Secondary | ICD-10-CM | POA: Diagnosis not present

## 2020-06-14 NOTE — Therapy (Signed)
[place] [place], [place], [address] Phone: [phone]   Fax:  [phone]  Physical Therapy Treatment  Patient Details  Name: Beverly [name] MRN: [id number] Date of Birth: 1950/05/05 Referring Provider (PT): Creig Hines   Encounter Date: 06/14/2020   PT End of Session - 06/14/20 1558    Visit Number 12    Date for PT Re-Evaluation 06/10/20    Authorization Type Aetna Medicare    PT Start Time 4401    PT Stop Time 1558    PT Time Calculation (min) 43 min    Activity Tolerance Patient tolerated treatment well    Behavior During Therapy [facility] for tasks assessed/performed           Past Medical History:  Diagnosis Date  . Cancer [facility])    Skin cancer  . Hyperlipidemia   . Hypertension     Past Surgical History:  Procedure Laterality Date  . COLONOSCOPY    . SALPINGOOPHORECTOMY    . TOTAL HIP ARTHROPLASTY Left 09/06/2018   Procedure: TOTAL HIP ARTHROPLASTY;  Surgeon: Earlie Server, MD;  Location: [facility];  Service: Orthopedics;  Laterality: Left;  . UTERINE ARTERY EMBOLIZATION      There were no vitals filed for this visit.   Subjective Assessment - 06/14/20 1519    Subjective "ok" got tangled up in some vines marking where she wanted some plants wanted but didn't fall    Currently in Pain? No/denies                             [facility] Adult PT Treatment/Exercise - 06/14/20 0001      Neuro Re-ed    Neuro Re-ed Details  Forward and side stp over foam rolls      Knee/Hip Exercises: Aerobic   Recumbent Bike L3 x 64mn    Nustep L5 x 6 min LE only      Knee/Hip Exercises: Machines for Strengthening   Cybex Knee Extension SL 5lb 2 x10,    Cybex Knee Flexion 25lb x12, SL15lb x10    Cybex Leg Press 40lb 2x10    Other Machine Rows & Lats 25lb 2x10      Knee/Hip Exercises: Standing   Forward Step Up Both;1 set;5 sets;Hand Hold: 1;Step Height: 8"                    PT  Short Term Goals - 04/20/20 1512      PT SHORT TERM GOAL #1   Title Ind with inital HEP    Status Partially Met             PT Long Term Goals - 06/02/20 1620      PT LONG TERM GOAL #1   Title Patient to report decreased pain in her left hip by 50% or more with ADLs.    Time 8    Period Weeks    Status Achieved      PT LONG TERM GOAL #2   Title Patient able to ambulate with a normal gait pattern with pain <= 2/10.    Time 8    Period Weeks    Status Partially Met   06/02/20: feels like thighs ache when walking, feels like she is shuffling     PT LONG TERM GOAL #3   Title Patient to demo 4+ to 5/5 BLE strength to ease ADLs.     Time  8    Period Weeks    Status On-going   Grossly improvedto 4-/5 LLE.     PT LONG TERM GOAL #4   Title go up and down stairs reciprocally    Time 8    Period Weeks    Status Partially Met   06/02/20: Reciprocal with 1 UE support. intermittent reciprocal with primary step to pattern without UE support with 1 LOB     PT LONG TERM GOAL #5   Title increase Berg balance score to 49/56    Time 8    Period Weeks    Status Achieved   50/56                Plan - 06/14/20 1559    Clinical Impression Statement Continues with some postural strengthening with increase resistance. She dos have some hesitation with lateral step ups due to balance. Cue needed with prevent circumduction when stepping over foam rolls. LLE fatigue noted with SL on leg extensions. Tactile cue provided to prevent posterior sway with seated rows.    Stability/Clinical Decision Making Stable/Uncomplicated    Rehab Potential Good    PT Frequency 2x / week    PT Duration 8 weeks    PT Treatment/Interventions ADLs/Self Care Home Management;Electrical Stimulation;Iontophoresis 24m/ml Dexamethasone;Moist Heat;Gait training;Neuromuscular re-education;Balance training;Therapeutic exercise;Therapeutic activities;Functional mobility training;Stair training;Patient/family  education;Manual techniques    PT Next Visit Plan get her moving, work on strength, ROM and balance, eyes closed interventions           Patient will benefit from skilled therapeutic intervention in order to improve the following deficits and impairments:  Abnormal gait,Decreased range of motion,Difficulty walking,Increased muscle spasms,Decreased endurance,Cardiopulmonary status limiting activity,Decreased activity tolerance,Pain,Improper body mechanics,Impaired flexibility,Decreased balance,Decreased mobility,Decreased strength  Visit Diagnosis: Stiffness of left hip, not elsewhere classified  Difficulty in walking, not elsewhere classified  Pain in left hip  Unsteadiness on feet     Problem List Patient Active Problem List   Diagnosis Date Noted  . Left leg weakness 04/05/2020  . S/P total hip arthroplasty 09/07/2018  . Degenerative joint disease of left hip 09/06/2018  . B12 deficiency 02/01/2018  . DDD (degenerative disc disease), lumbar 01/03/2018  . Hyponatremia 10/04/2017  . Thrombocytosis 10/04/2017  . Chronic respiratory failure with hypoxia ([place] 09/01/2017  . Dyslipidemia, goal LDL below 70 12/06/2016  . Aortic atherosclerosis ([place] 12/06/2016  . Diverticulosis 04/11/2016  . Essential hypertension 10/03/2013  . Current smoker 10/03/2013  . Esophageal reflux 10/03/2013  . Anxiety disorder 10/03/2013  . Allergic rhinitis 10/03/2013  . Insomnia 07/17/2013    [name] 06/14/2020, 4:02 PM  [place] [address]Phone: [phone]  Fax:  [phone] Name: Beverly LAFRANCEMRN: [id number] of Birth: 712-Oct-1952

## 2020-06-17 ENCOUNTER — Ambulatory Visit: Payer: Medicare HMO | Admitting: Physical Therapy

## 2020-06-23 ENCOUNTER — Encounter: Payer: Medicare HMO | Admitting: Neurology

## 2020-06-28 DIAGNOSIS — R059 Cough, unspecified: Secondary | ICD-10-CM | POA: Diagnosis not present

## 2020-06-28 DIAGNOSIS — J208 Acute bronchitis due to other specified organisms: Secondary | ICD-10-CM | POA: Diagnosis not present

## 2020-06-28 DIAGNOSIS — Z20822 Contact with and (suspected) exposure to covid-19: Secondary | ICD-10-CM | POA: Diagnosis not present

## 2020-06-28 DIAGNOSIS — I10 Essential (primary) hypertension: Secondary | ICD-10-CM | POA: Diagnosis not present

## 2020-06-28 DIAGNOSIS — J209 Acute bronchitis, unspecified: Secondary | ICD-10-CM | POA: Diagnosis not present

## 2020-06-28 DIAGNOSIS — R69 Illness, unspecified: Secondary | ICD-10-CM | POA: Diagnosis not present

## 2020-06-28 DIAGNOSIS — I700 Atherosclerosis of aorta: Secondary | ICD-10-CM | POA: Diagnosis not present

## 2020-06-28 DIAGNOSIS — R5381 Other malaise: Secondary | ICD-10-CM | POA: Diagnosis not present

## 2020-06-28 DIAGNOSIS — J9611 Chronic respiratory failure with hypoxia: Secondary | ICD-10-CM | POA: Diagnosis not present

## 2020-06-28 DIAGNOSIS — R5383 Other fatigue: Secondary | ICD-10-CM | POA: Diagnosis not present

## 2020-06-30 ENCOUNTER — Encounter: Payer: Self-pay | Admitting: Physical Therapy

## 2020-06-30 ENCOUNTER — Ambulatory Visit: Payer: Medicare HMO | Admitting: Physical Therapy

## 2020-06-30 ENCOUNTER — Other Ambulatory Visit: Payer: Self-pay

## 2020-06-30 DIAGNOSIS — R2681 Unsteadiness on feet: Secondary | ICD-10-CM

## 2020-06-30 DIAGNOSIS — M25652 Stiffness of left hip, not elsewhere classified: Secondary | ICD-10-CM

## 2020-06-30 DIAGNOSIS — M25552 Pain in left hip: Secondary | ICD-10-CM | POA: Diagnosis not present

## 2020-06-30 DIAGNOSIS — R262 Difficulty in walking, not elsewhere classified: Secondary | ICD-10-CM | POA: Diagnosis not present

## 2020-06-30 NOTE — Therapy (Signed)
[place]. [place], [place], [address] Phone: [phone]   Fax:  [phone]  Physical Therapy Treatment  Patient Details  Name: Beverly [name] MRN: [id number] Date of Birth: March 31, 1950 Referring Provider (PT): Creig Hines   Encounter Date: 06/30/2020   PT End of Session - 06/30/20 1791    Visit Number 13    Date for PT Re-Evaluation 06/10/20    Authorization Type Aetna Medicare    PT Start Time 1600    PT Stop Time 5056    PT Time Calculation (min) 31 min    Activity Tolerance No increased pain;Patient limited by fatigue    Behavior During Therapy [facility] for tasks assessed/performed           Past Medical History:  Diagnosis Date  . Cancer [facility])    Skin cancer  . Hyperlipidemia   . Hypertension     Past Surgical History:  Procedure Laterality Date  . COLONOSCOPY    . SALPINGOOPHORECTOMY    . TOTAL HIP ARTHROPLASTY Left 09/06/2018   Procedure: TOTAL HIP ARTHROPLASTY;  Surgeon: Earlie Server, MD;  [facility];  Service: Orthopedics;  Laterality: Left;  . UTERINE ARTERY EMBOLIZATION      There were no vitals filed for this visit.   Subjective Assessment - 06/30/20 1602    Subjective Has not been feeling good since last Tuesday, no appetite or energy. Went to [place] Covid test negative. Had a fall last week taking off slippers, no injuries    Currently in Pain? No/denies                             [facility] Adult PT Treatment/Exercise - 06/30/20 0001      Knee/Hip Exercises: Aerobic   Recumbent Bike L1 x 55mn (P)     Nustep L5 x 6 min LE only (P)       Knee/Hip Exercises: Machines for Strengthening   Cybex Knee Extension 10lb 2x10 (P)     Cybex Knee Flexion 25lb 2x10 (P)     Other Machine Rows & Lats 25lb 2x10 (P)                     PT Short Term Goals - 04/20/20 1512      PT SHORT TERM GOAL #1   Title Ind with inital HEP    Status Partially Met              PT Long Term Goals - 06/30/20 1644      PT LONG TERM GOAL #1   Title Patient to report decreased pain in her left hip by 50% or more with ADLs.    Status Achieved      PT LONG TERM GOAL #2   Title Patient able to ambulate with a normal gait pattern with pain <= 2/10.    Status Partially Met      PT LONG TERM GOAL #3   Title Patient to demo 4+ to 5/5 BLE strength to ease ADLs.     Status On-going      PT LONG TERM GOAL #4   Title go up and down stairs reciprocally    Status Partially Met      PT LONG TERM GOAL #5   Title increase Berg balance score to 49/56    Status Achieved  Plan - 06/30/20 1642    Clinical Impression Statement Pt returns to therapy after two weeks, reporting that she has not been feeling well since last Tuesday. Today's session was limited by fatigue. Se reports that she has been rescheduling text due to being out of town, she reports a future neurology appointment. No issues with today's interventions other than fatigue. Will discontinue PT until pt is finished with other medical obligations.    Stability/Clinical Decision Making Stable/Uncomplicated    Rehab Potential Good    PT Frequency 2x / week    PT Duration 8 weeks    PT Next Visit Plan D/C PT           Patient will benefit from skilled therapeutic intervention in order to improve the following deficits and impairments:  Abnormal gait,Decreased range of motion,Difficulty walking,Increased muscle spasms,Decreased endurance,Cardiopulmonary status limiting activity,Decreased activity tolerance,Pain,Improper body mechanics,Impaired flexibility,Decreased balance,Decreased mobility,Decreased strength  Visit Diagnosis: Stiffness of left hip, not elsewhere classified  Unsteadiness on feet  Pain in left hip  Difficulty in walking, not elsewhere classified     Problem List Patient Active Problem List   Diagnosis Date Noted  . Left leg weakness 04/05/2020  . S/P  total hip arthroplasty 09/07/2018  . Degenerative joint disease of left hip 09/06/2018  . B12 deficiency 02/01/2018  . DDD (degenerative disc disease), lumbar 01/03/2018  . Hyponatremia 10/04/2017  . Thrombocytosis 10/04/2017  . Chronic respiratory failure with hypoxia ([place]) 09/01/2017  . Dyslipidemia, goal LDL below 70 12/06/2016  . Aortic atherosclerosis ([place]) 12/06/2016  . Diverticulosis 04/11/2016  . Essential hypertension 10/03/2013  . Current smoker 10/03/2013  . Esophageal reflux 10/03/2013  . Anxiety disorder 10/03/2013  . Allergic rhinitis 10/03/2013  . Insomnia 07/17/2013   PHYSICAL THERAPY DISCHARGE SUMMARY  Visits from Start of Care: 13  Plan: Patient agrees to discharge.  Patient goals were partially met. Patient is being discharged due to a change in medical status.  ?????       Scot Jun, PTA 06/30/2020, 4:45 PM  [address]. [place], [place], [address] Phone: [phone]   Fax:  [phone]  Name: MARIANELA MANDRELL MRN: [id number] Date of Birth: 12-Oct-1950

## 2020-07-05 ENCOUNTER — Other Ambulatory Visit: Payer: Self-pay

## 2020-07-05 ENCOUNTER — Emergency Department (HOSPITAL_BASED_OUTPATIENT_CLINIC_OR_DEPARTMENT_OTHER)
Admission: EM | Admit: 2020-07-05 | Discharge: 2020-07-05 | Disposition: A | Discharge status: Home / Self Care | Payer: Medicare HMO | Attending: Emergency Medicine | Admitting: Emergency Medicine

## 2020-07-05 ENCOUNTER — Ambulatory Visit: Payer: Medicare HMO | Admitting: Physical Therapy

## 2020-07-05 ENCOUNTER — Encounter (HOSPITAL_BASED_OUTPATIENT_CLINIC_OR_DEPARTMENT_OTHER): Payer: Self-pay

## 2020-07-05 ENCOUNTER — Emergency Department (HOSPITAL_BASED_OUTPATIENT_CLINIC_OR_DEPARTMENT_OTHER): Payer: Medicare HMO

## 2020-07-05 DIAGNOSIS — Z96642 Presence of left artificial hip joint: Secondary | ICD-10-CM | POA: Insufficient documentation

## 2020-07-05 DIAGNOSIS — I10 Essential (primary) hypertension: Secondary | ICD-10-CM | POA: Insufficient documentation

## 2020-07-05 DIAGNOSIS — R5383 Other fatigue: Secondary | ICD-10-CM | POA: Diagnosis not present

## 2020-07-05 DIAGNOSIS — R69 Illness, unspecified: Secondary | ICD-10-CM | POA: Diagnosis not present

## 2020-07-05 DIAGNOSIS — R5381 Other malaise: Secondary | ICD-10-CM | POA: Diagnosis not present

## 2020-07-05 DIAGNOSIS — Z85828 Personal history of other malignant neoplasm of skin: Secondary | ICD-10-CM | POA: Diagnosis not present

## 2020-07-05 DIAGNOSIS — Z20822 Contact with and (suspected) exposure to covid-19: Secondary | ICD-10-CM | POA: Insufficient documentation

## 2020-07-05 DIAGNOSIS — F17210 Nicotine dependence, cigarettes, uncomplicated: Secondary | ICD-10-CM | POA: Diagnosis not present

## 2020-07-05 DIAGNOSIS — R062 Wheezing: Secondary | ICD-10-CM | POA: Insufficient documentation

## 2020-07-05 DIAGNOSIS — E871 Hypo-osmolality and hyponatremia: Secondary | ICD-10-CM | POA: Insufficient documentation

## 2020-07-05 DIAGNOSIS — Z7901 Long term (current) use of anticoagulants: Secondary | ICD-10-CM | POA: Insufficient documentation

## 2020-07-05 DIAGNOSIS — Z79899 Other long term (current) drug therapy: Secondary | ICD-10-CM | POA: Insufficient documentation

## 2020-07-05 LAB — RESP PANEL BY RT-PCR (FLU A&B, COVID) ARPGX2
Influenza A by PCR: NEGATIVE
Influenza B by PCR: NEGATIVE
SARS Coronavirus 2 by RT PCR: NEGATIVE

## 2020-07-05 LAB — URINALYSIS, ROUTINE W REFLEX MICROSCOPIC
Bilirubin Urine: NEGATIVE
Glucose, UA: NEGATIVE mg/dL
Hgb urine dipstick: NEGATIVE
Ketones, ur: NEGATIVE mg/dL
Nitrite: NEGATIVE
Protein, ur: NEGATIVE mg/dL
Specific Gravity, Urine: 1.01 (ref 1.005–1.030)
pH: 7.5 (ref 5.0–8.0)

## 2020-07-05 LAB — CBC WITH DIFFERENTIAL/PLATELET
Abs Immature Granulocytes: 0.78 10*3/uL — ABNORMAL HIGH (ref 0.00–0.07)
Basophils Absolute: 0.1 10*3/uL (ref 0.0–0.1)
Basophils Relative: 0 %
Eosinophils Absolute: 0 10*3/uL (ref 0.0–0.5)
Eosinophils Relative: 0 %
HCT: 39.1 % (ref 36.0–46.0)
Hemoglobin: 13.8 g/dL (ref 12.0–15.0)
Immature Granulocytes: 6 %
Lymphocytes Relative: 9 %
Lymphs Abs: 1.3 10*3/uL (ref 0.7–4.0)
MCH: 32.7 pg (ref 26.0–34.0)
MCHC: 35.3 g/dL (ref 30.0–36.0)
MCV: 92.7 fL (ref 80.0–100.0)
Monocytes Absolute: 1.5 10*3/uL — ABNORMAL HIGH (ref 0.1–1.0)
Monocytes Relative: 10 %
Neutro Abs: 10.7 10*3/uL — ABNORMAL HIGH (ref 1.7–7.7)
Neutrophils Relative %: 75 %
Platelets: 773 10*3/uL — ABNORMAL HIGH (ref 150–400)
RBC: 4.22 MIL/uL (ref 3.87–5.11)
RDW: 11.8 % (ref 11.5–15.5)
Smear Review: INCREASED
WBC: 14.3 10*3/uL — ABNORMAL HIGH (ref 4.0–10.5)
nRBC: 0 % (ref 0.0–0.2)

## 2020-07-05 LAB — COMPREHENSIVE METABOLIC PANEL
ALT: 24 U/L (ref 0–44)
AST: 18 U/L (ref 15–41)
Albumin: 3.9 g/dL (ref 3.5–5.0)
Alkaline Phosphatase: 34 U/L — ABNORMAL LOW (ref 38–126)
Anion gap: 12 (ref 5–15)
BUN: 16 mg/dL (ref 8–23)
CO2: 24 mmol/L (ref 22–32)
Calcium: 9.4 mg/dL (ref 8.9–10.3)
Chloride: 88 mmol/L — ABNORMAL LOW (ref 98–111)
Creatinine, Ser: 0.7 mg/dL (ref 0.44–1.00)
GFR, Estimated: >60 mL/min (ref >60)
Glucose, Bld: 117 mg/dL — ABNORMAL HIGH (ref 70–99)
Potassium: 4.8 mmol/L (ref 3.5–5.1)
Sodium: 124 mmol/L — ABNORMAL LOW (ref 135–145)
Total Bilirubin: 0.5 mg/dL (ref 0.3–1.2)
Total Protein: 6.8 g/dL (ref 6.5–8.1)

## 2020-07-05 LAB — TROPONIN I (HIGH SENSITIVITY): Troponin I (High Sensitivity): 2 ng/L (ref <18)

## 2020-07-05 LAB — URINALYSIS, MICROSCOPIC (REFLEX)

## 2020-07-05 MED ORDER — SODIUM CHLORIDE 0.9 % IV BOLUS
1000.0000 mL | Freq: Once | INTRAVENOUS | Status: AC
  Administered 2020-07-05: 1000 mL via INTRAVENOUS

## 2020-07-05 MED ORDER — ALBUTEROL SULFATE HFA 108 (90 BASE) MCG/ACT IN AERS
2.0000 | INHALATION_SPRAY | Freq: Once | RESPIRATORY_TRACT | Status: AC
  Administered 2020-07-05: 2 via RESPIRATORY_TRACT
  Filled 2020-07-05: qty 6.7

## 2020-07-05 NOTE — ED Notes (Signed)
Presents today with fatigue, lack of energy, loss of appetite, has been staying in bed at all times. Has been dx with Bronchitis. Noted to have a strong congested cough. Abx prescribed and taking since last Thursday, also taking prednisone.

## 2020-07-05 NOTE — ED Provider Notes (Signed)
[facility] EMERGENCY DEPARTMENT Provider Note   CSN: [id number] Arrival date & time: 07/05/20  1240     History Chief Complaint  Patient presents with  . Fatigue    Beverly [name] is a 70 y.o. female who presents emergency department with a chief complaint of fatigue.  Patient is a daily smoker.  She has a history of aortic atherosclerosis, hypertension. patient developed bronchitis and was seen by her primary care physician on 06/28/2020.  She was diagnosed with acute bronchitis with hypoxic respiratory failure, she was placed on doxycycline, prednisone and Hycodan syrup.  She had a COVID test however the results are not available in the chart.  The patient states that she has had no appetite and has felt extremely weak and fatigued.  She has had, per the patient a 6 to 7 pound weight loss in the past week.  She denies diarrhea, nausea, vomiting, shortness of breath, urinary symptoms, melena or hematochezia, exertional dyspnea or chest pain.  She came in for further evaluation.  She is currently finishing up a prednisone taper at is still taking doxycycline.  HPI     Past Medical History:  Diagnosis Date  . Cancer [facility])    Skin cancer  . Hyperlipidemia   . Hypertension     Patient Active Problem List   Diagnosis Date Noted  . Left leg weakness 04/05/2020  . S/P total hip arthroplasty 09/07/2018  . Degenerative joint disease of left hip 09/06/2018  . B12 deficiency 02/01/2018  . DDD (degenerative disc disease), lumbar 01/03/2018  . Hyponatremia 10/04/2017  . Thrombocytosis 10/04/2017  . Chronic respiratory failure with hypoxia ([place]) 09/01/2017  . Dyslipidemia, goal LDL below 70 12/06/2016  . Aortic atherosclerosis ([place]) 12/06/2016  . Diverticulosis 04/11/2016  . Essential hypertension 10/03/2013  . Current smoker 10/03/2013  . Esophageal reflux 10/03/2013  . Anxiety disorder 10/03/2013  . Allergic rhinitis 10/03/2013  . Insomnia 07/17/2013    Past Surgical  History:  Procedure Laterality Date  . COLONOSCOPY    . SALPINGOOPHORECTOMY    . TOTAL HIP ARTHROPLASTY Left 09/06/2018   Procedure: TOTAL HIP ARTHROPLASTY;  Surgeon: Earlie Server, MD;  Location: [facility];  Service: Orthopedics;  Laterality: Left;  . UTERINE ARTERY EMBOLIZATION       OB History   No obstetric history on file.     Family History  Problem Relation Age of Onset  . Heart attack Father     Social History   Tobacco Use  . Smoking status: Current Every Day Smoker    Packs/day: 0.50    Years: 40.00    Pack years: 20.00    Types: Cigarettes  . Smokeless tobacco: Never Used  Vaping Use  . Vaping Use: Never used  Substance Use Topics  . Alcohol use: Yes    Comment: occ  . Drug use: Never    Home Medications Prior to Admission medications   Medication Sig Start Date End Date Taking? Authorizing Provider  acetaminophen (TYLENOL) 500 MG tablet Take 500 mg by mouth every 6 (six) hours as needed for moderate pain.     [provider]  ADVAIR HFA [id number] MCG/ACT inhaler Inhale 2 puffs into the lungs daily as needed (shortness of breath or wheezing).  04/08/18   [provider]  alendronate (FOSAMAX) 70 MG tablet Take 70 mg by mouth once a week.  03/21/18   [provider]  apixaban (ELIQUIS) 2.5 MG TABS tablet Take 1 tablet (2.5 mg total) by mouth  2 (two) times daily. 09/08/18   Prudencio Burly III, PA-C  atorvastatin (LIPITOR) 20 MG tablet Take 20 mg by mouth daily.  03/26/18   [provider]  baclofen (LIORESAL) 10 MG tablet Take 1 tablet (10 mg total) by mouth 3 (three) times daily as needed for muscle spasms. 09/08/18   Prudencio Burly III, PA-C  celecoxib (CELEBREX) 200 MG capsule Take 200 mg by mouth daily as needed for moderate pain.     [provider]  Cholecalciferol (VITAMIN D3) 50 MCG (2000 UT) TABS Take 2,000 Units by mouth daily.     [provider]  ezetimibe (ZETIA) 10 MG tablet Take 10 mg by  mouth daily.  03/18/18   [provider]  fenofibrate 160 MG tablet Take 160 mg by mouth daily.  03/04/18   [provider]  hydroxypropyl methylcellulose / hypromellose (ISOPTO TEARS / GONIOVISC) 2.5 % ophthalmic solution Place 1 drop into both eyes 2 (two) times daily as needed for dry eyes.    [provider]  losartan (COZAAR) 100 MG tablet Take 100 mg by mouth daily.  02/21/18   [provider]  metoprolol succinate (TOPROL-XL) 100 MG 24 hr tablet Take 100 mg by mouth daily.  02/19/18   [provider]  oxyCODONE (OXY IR/ROXICODONE) 5 MG immediate release tablet Take one tab po q4-6hrs prn pain, may need 1-2 first couple weeks 09/08/18   Prudencio Burly III, PA-C  traMADol (ULTRAM) 50 MG tablet Take 50 mg by mouth every 6 (six) hours as needed for moderate pain.     [provider]  venlafaxine XR (EFFEXOR-XR) 37.5 MG 24 hr capsule Take 37.5 mg by mouth daily with breakfast.  04/08/18   [provider]  vitamin B-12 (CYANOCOBALAMIN) 1000 MCG tablet Take 1,000 mcg by mouth daily.    [provider]    Allergies    Patient has no known allergies.  Review of Systems   Review of Systems Ten systems reviewed and are negative for acute change, except as noted in the HPI.   Physical Exam Updated Vital Signs BP (!) 167/85   Pulse 63   Temp 98.2 F (36.8 C) (Oral)   Resp 18   Ht 5\' 2"  (1.575 m)   Wt 58.1 kg   SpO2 97%   BMI 23.41 kg/m   Physical Exam Vitals and nursing note reviewed.  Constitutional:      General: She is not in acute distress.    Appearance: She is well-developed. She is not diaphoretic.  HENT:     Head: Normocephalic and atraumatic.  Eyes:     General: No scleral icterus.    Conjunctiva/sclera: Conjunctivae normal.  Cardiovascular:     Rate and Rhythm: Normal rate and regular rhythm.     Heart sounds: Normal heart sounds. No murmur heard. No friction rub. No gallop.   Pulmonary:      Effort: Pulmonary effort is normal. No respiratory distress.     Breath sounds: Wheezing present.     Comments: Diffuse but mild inspiratory and expiratory wheezes in all lung fields, excellent air movement Abdominal:     General: Bowel sounds are normal. There is no distension.     Palpations: Abdomen is soft. There is no mass.     Tenderness: There is no abdominal tenderness. There is no guarding.  Musculoskeletal:     Cervical back: Normal range of motion.  Skin:    General: Skin is warm and  dry.  Neurological:     Mental Status: She is alert and oriented to person, place, and time.  Psychiatric:        Behavior: Behavior normal.     ED Results / Procedures / Treatments   Labs (all labs ordered are listed, but only abnormal results are displayed) Labs Reviewed  COMPREHENSIVE METABOLIC PANEL - Abnormal; Notable for the following components:      Result Value   Sodium 124 (*)    Chloride 88 (*)    Glucose, Bld 117 (*)    Alkaline Phosphatase 34 (*)    All other components within normal limits  CBC WITH DIFFERENTIAL/PLATELET - Abnormal; Notable for the following components:   WBC 14.3 (*)    Platelets 773 (*)    Neutro Abs 10.7 (*)    Monocytes Absolute 1.5 (*)    Abs Immature Granulocytes 0.78 (*)    All other components within normal limits  URINALYSIS, ROUTINE W REFLEX MICROSCOPIC - Abnormal; Notable for the following components:   Leukocytes,Ua TRACE (*)    All other components within normal limits  URINALYSIS, MICROSCOPIC (REFLEX) - Abnormal; Notable for the following components:   Bacteria, UA RARE (*)    All other components within normal limits  RESP PANEL BY RT-PCR (FLU A&B, COVID) ARPGX2  TROPONIN I (HIGH SENSITIVITY)  TROPONIN I (HIGH SENSITIVITY)    EKG EKG Interpretation  Date/Time:  Monday July 05 2020 15:12:05 EDT Ventricular Rate:  57 PR Interval:  147 QRS Duration: 70 QT Interval:  403 QTC Calculation: 393 R Axis:   20 Text  Interpretation: Sinus rhythm Probable left atrial enlargement Low voltage, precordial leads Sinus bradycardia Confirmed by Lavenia Atlas [phone]) on 07/05/2020 4:21:01 PM   Radiology DG Chest Port 1 View  Result Date: 07/05/2020 CLINICAL DATA:  Decreased appetite and fatigue since 06/22/2020. EXAM: PORTABLE CHEST 1 VIEW COMPARISON:  Single-view of the chest 08/31/2017. FINDINGS: Small focus of linear atelectasis or scar is seen in the left lung base. The lungs are otherwise clear. Heart size is normal. Aortic atherosclerosis. No pneumothorax or pleural fluid. No acute or focal bony abnormality. IMPRESSION: No acute disease. Aortic Atherosclerosis (ICD10-I70.0). Electronically Signed   By: Inge Rise M.D.   On: 07/05/2020 15:15    Procedures Procedures   Medications Ordered in ED Medications  albuterol (VENTOLIN HFA) 108 (90 Base) MCG/ACT inhaler 2 puff (2 puffs Inhalation Given 07/05/20 1503)  sodium chloride 0.9 % bolus 1,000 mL (0 mLs Intravenous Stopped 07/05/20 1746)    ED Course  I have reviewed the triage vital signs and the nursing notes.  Pertinent labs & imaging results that were available during my care of the patient were reviewed by me and considered in my medical decision making (see chart for details).    MDM Rules/Calculators/A&P                          70 year old female here with complaint of weakness and loss of appetite after recent diagnosis of bronchitis currently on prednisone and doxycycline.The differential diagnosis of weakness includes but is not limited to neurologic causes (GBS, myasthenia gravis, CVA, MS, ALS, transverse myelitis, spinal cord injury, CVA, botulism, ) and other causes: ACS, Arrhythmia, syncope, orthostatic hypotension, sepsis, hypoglycemia, electrolyte disturbance, hypothyroidism, respiratory failure, symptomatic anemia, dehydration, heat injury, polypharmacy, malignancy. I ordered and reviewed labs that show elevated white blood cell count  likely secondary to use of prednisone, respiratory panel is negative for  COVID or influenza, CMP shows mild hyponatremia treated here with IV saline bolus.  Urine is negative for infection.  Initial troponin is within normal limits.  Portable 1 view chest x-ray shows no acute abnormalities.  EKG shows sinus bradycardia at a rate of.  Patient is feeling greatly improved after fluids.  She states she is still hungry she like to eat a pizza.  Feel patient is safe for discharge with close outpatient follow-up.  Discussed return precautions.  He is otherwise appropriate for discharge at this time Final Clinical Impression(s) / ED Diagnoses Final diagnoses:  Malaise and fatigue  Hyponatremia    Rx / DC Orders ED Discharge Orders    None       Margarita Mail, PA-C 07/05/20 [place], Alvin Critchley, DO 07/07/20 [phone]

## 2020-07-05 NOTE — ED Triage Notes (Signed)
Pt c/o fatigue, decreased appetite since 4/12-states she was seen by PCP 4/18-dx with bronchitis-rx prednisone, cough med and taking abx-states she had neg covid test-NAD-steady gait

## 2020-07-05 NOTE — Discharge Instructions (Signed)
Contact a health care provider if: °You develop worsening nausea, fatigue, headache, confusion, or weakness. °Your symptoms go away and then return. °You have problems following the recommended diet. °Get help right away if: °You have a seizure. °You pass out. °You have ongoing diarrhea or vomiting. °

## 2020-07-06 DIAGNOSIS — Z1231 Encounter for screening mammogram for malignant neoplasm of breast: Secondary | ICD-10-CM | POA: Diagnosis not present

## 2020-07-07 ENCOUNTER — Ambulatory Visit: Payer: Medicare HMO | Admitting: Physical Therapy

## 2020-07-12 ENCOUNTER — Ambulatory Visit: Payer: Medicare HMO | Admitting: Physical Therapy

## 2020-07-14 ENCOUNTER — Ambulatory Visit: Payer: Medicare HMO | Admitting: Physical Therapy

## 2020-07-28 ENCOUNTER — Other Ambulatory Visit: Payer: Self-pay

## 2020-07-28 ENCOUNTER — Ambulatory Visit: Payer: Medicare HMO | Admitting: Neurology

## 2020-07-28 DIAGNOSIS — M5417 Radiculopathy, lumbosacral region: Secondary | ICD-10-CM

## 2020-07-28 DIAGNOSIS — R202 Paresthesia of skin: Secondary | ICD-10-CM

## 2020-07-28 NOTE — Procedures (Signed)
[facility] Neurology  [place], [place]  [place], [place] [address] Tel: [phone] Fax:  [phone] Test Date:  07/28/2020  Patient: Beverly [name] DOB: 10/05/1950 Physician: Narda Amber, DO  Sex: Female Height: 5\' 2"  Ref Phys: Hulan Saas, DO  ID#: [id number]   Technician:    Patient Complaints: This is a 70 year old female referred for evaluation of left weakness.  NCV & EMG Findings: Extensive electrodiagnostic testing of the left lower extremity shows:  1. Left sural and superficial peroneal sensory responses are within normal limits. 2. Left peroneal and tibial motor responses are within normal limits. 3. Chronic motor axonal loss changes are seen affecting the L3-4 myotomes on the left, without accompanied active denervation.  Impression: 1. 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 2. There is no evidence of a sensorimotor polyneuropathy affecting the left lower extremity.   ___________________________ Narda Amber, DO    Nerve Conduction Studies Anti Sensory Summary Table   Stim Site NR Peak (ms) Norm Peak (ms) P-T Amp (V) Norm P-T Amp  Left Sup Peroneal Anti Sensory (Ant Lat Mall)  33C  12 cm    2.6 <4.6 14.0 >3  Left Sural Anti Sensory (Lat Mall)  33C  Calf    3.7 <4.6 10.9 >3   Motor Summary Table   Stim Site NR Onset (ms) Norm Onset (ms) O-P Amp (mV) Norm O-P Amp Site1 Site2 Delta-0 (ms) Dist (cm) Vel (m/s) Norm Vel (m/s)  Left Peroneal Motor (Ext Dig Brev)  33C  Ankle    3.9 <6.0 2.7 >2.5 B Fib Ankle 6.9 34.0 49 >40  B Fib    10.8  2.3  Poplt B Fib 1.7 8.0 47 >40  Poplt    12.5  2.3         Left Tibial Motor (Abd Hall Brev)  33C  Ankle    3.4 <6.0 20.3 >4 Knee Ankle 8.3 42.0 51 >40  Knee    11.7  14.0          H Reflex Studies   NR H-Lat (ms) Lat Norm (ms) L-R H-Lat (ms)  Left Tibial (Gastroc)  33C     31.97 <35    EMG   Side Muscle Ins Act Fibs Psw Fasc Number Recrt Dur Dur. Amp Amp. Poly  Poly. Comment  Left AntTibialis Nml Nml Nml Nml Nml Nml Nml Nml Nml Nml Nml Nml N/A  Left Gastroc Nml Nml Nml Nml Nml Nml Nml Nml Nml Nml Nml Nml N/A  Left RectFemoris Nml Nml Nml Nml 1- Rapid Some 1+ Some 1+ Some 1+ N/A  Left Flex Dig Long Nml Nml Nml Nml Nml Nml Nml Nml Nml Nml Nml Nml N/A  Left GluteusMed Nml Nml Nml Nml Nml Nml Nml Nml Nml Nml Nml Nml N/A  Left AdductorLong Nml Nml Nml Nml 1- Rapid Some 1+ Some 1+ Some 1+ N/A      Waveforms:

## 2020-08-02 ENCOUNTER — Telehealth: Payer: Self-pay | Admitting: Family Medicine

## 2020-08-02 NOTE — Telephone Encounter (Signed)
Patient called stating that she saw in [place], Dr Tamala Julian recommended an epidural. She had further questions about the epidural and was not fully in favor of the idea.

## 2020-08-03 DIAGNOSIS — Z0000 Encounter for general adult medical examination without abnormal findings: Secondary | ICD-10-CM | POA: Diagnosis not present

## 2020-08-03 DIAGNOSIS — H02413 Mechanical ptosis of bilateral eyelids: Secondary | ICD-10-CM | POA: Diagnosis not present

## 2020-08-03 DIAGNOSIS — Z122 Encounter for screening for malignant neoplasm of respiratory organs: Secondary | ICD-10-CM | POA: Diagnosis not present

## 2020-08-03 DIAGNOSIS — H02834 Dermatochalasis of left upper eyelid: Secondary | ICD-10-CM | POA: Diagnosis not present

## 2020-08-03 DIAGNOSIS — H02423 Myogenic ptosis of bilateral eyelids: Secondary | ICD-10-CM | POA: Diagnosis not present

## 2020-08-03 DIAGNOSIS — H02831 Dermatochalasis of right upper eyelid: Secondary | ICD-10-CM | POA: Diagnosis not present

## 2020-08-03 DIAGNOSIS — H57813 Brow ptosis, bilateral: Secondary | ICD-10-CM | POA: Diagnosis not present

## 2020-08-03 DIAGNOSIS — E538 Deficiency of other specified B group vitamins: Secondary | ICD-10-CM | POA: Diagnosis not present

## 2020-08-03 DIAGNOSIS — H0279 Other degenerative disorders of eyelid and periocular area: Secondary | ICD-10-CM | POA: Diagnosis not present

## 2020-08-03 DIAGNOSIS — Z13220 Encounter for screening for lipoid disorders: Secondary | ICD-10-CM | POA: Diagnosis not present

## 2020-08-03 DIAGNOSIS — H53483 Generalized contraction of visual field, bilateral: Secondary | ICD-10-CM | POA: Diagnosis not present

## 2020-08-03 NOTE — Telephone Encounter (Signed)
Spoke with patient who is going to think about getting epidural injection and call us back.

## 2020-08-03 NOTE — Telephone Encounter (Signed)
Left message for patient to call back  

## 2020-08-18 DIAGNOSIS — H53483 Generalized contraction of visual field, bilateral: Secondary | ICD-10-CM | POA: Diagnosis not present

## 2020-08-18 DIAGNOSIS — N3001 Acute cystitis with hematuria: Secondary | ICD-10-CM | POA: Diagnosis not present

## 2020-08-20 DIAGNOSIS — L814 Other melanin hyperpigmentation: Secondary | ICD-10-CM | POA: Diagnosis not present

## 2020-08-20 DIAGNOSIS — D485 Neoplasm of uncertain behavior of skin: Secondary | ICD-10-CM | POA: Diagnosis not present

## 2020-08-20 DIAGNOSIS — C44529 Squamous cell carcinoma of skin of other part of trunk: Secondary | ICD-10-CM | POA: Diagnosis not present

## 2020-08-20 DIAGNOSIS — Z85828 Personal history of other malignant neoplasm of skin: Secondary | ICD-10-CM | POA: Diagnosis not present

## 2020-08-20 DIAGNOSIS — L821 Other seborrheic keratosis: Secondary | ICD-10-CM | POA: Diagnosis not present

## 2020-08-20 DIAGNOSIS — L578 Other skin changes due to chronic exposure to nonionizing radiation: Secondary | ICD-10-CM | POA: Diagnosis not present

## 2020-08-20 DIAGNOSIS — D225 Melanocytic nevi of trunk: Secondary | ICD-10-CM | POA: Diagnosis not present

## 2020-08-25 ENCOUNTER — Encounter: Payer: Medicare HMO | Admitting: Neurology

## 2020-08-25 DIAGNOSIS — I700 Atherosclerosis of aorta: Secondary | ICD-10-CM | POA: Diagnosis not present

## 2020-08-25 DIAGNOSIS — J439 Emphysema, unspecified: Secondary | ICD-10-CM | POA: Diagnosis not present

## 2020-08-25 DIAGNOSIS — Z122 Encounter for screening for malignant neoplasm of respiratory organs: Secondary | ICD-10-CM | POA: Diagnosis not present

## 2020-08-25 DIAGNOSIS — R69 Illness, unspecified: Secondary | ICD-10-CM | POA: Diagnosis not present

## 2020-09-28 ENCOUNTER — Encounter: Payer: Self-pay | Admitting: Family Medicine

## 2020-09-28 ENCOUNTER — Ambulatory Visit (INDEPENDENT_AMBULATORY_CARE_PROVIDER_SITE_OTHER): Payer: Medicare HMO | Admitting: Family Medicine

## 2020-09-28 ENCOUNTER — Other Ambulatory Visit: Payer: Self-pay

## 2020-09-28 DIAGNOSIS — M5136 Other intervertebral disc degeneration, lumbar region: Secondary | ICD-10-CM

## 2020-09-28 NOTE — Progress Notes (Signed)
Virtual Visit via Video Note  I connected with Beverly [name] on 09/28/20 at  4:00 PM EDT by a video enabled telemedicine application and verified that I am speaking with the correct person using two identifiers. Alone on the phone call.  Difficulty with the video platform Location: Patient: In home setting Provider: In office setting   I discussed the limitations of evaluation and management by telemedicine and the availability of in person appointments. The patient expressed understanding and agreed to proceed.  History of Present Illness: Patient is a 70 year old female who is having low back pain.  An MRI of the lumbar spine found to have multilevel degenerative changes mostly of the left subarticular and foraminal stenosis at L4-L5 and L5 S1 on the left side that is consistent with some of her symptoms.  Patient also has an L3-L4 stenosis noted and patient's nerve conduction study did show that there was some chronic problems and demyelination at this point.  Patient states overall she is not having significant pain.  That at this point she is just more of having the weakness of the lower extremities.  States that that is what is contributing to more of concern.  Still feels that some of it is secondary to the surgery of her hip.  Patient is still trying to gain more confidence.   Observations/Objective: Alert, difficulty with virtual platform otherwise.   Assessment and Plan: 70 year old female with moderate spinal stenosis at L3-L4 and foraminal narrowing at L3-S1 on the left side consistent with patient's symptoms.  Discussed with patient about the possibility of epidural again which patient declined.  Also discussed the possibility of aquatic therapy which patient declined.  Increase activity as tolerated.     Follow Up Instructions: Follow-up as needed or if she change her mind otherwise.  Patient is in agreement with the plan.    I discussed the assessment and treatment plan with  the patient. The patient was provided an opportunity to ask questions and all were answered. The patient agreed with the plan and demonstrated an understanding of the instructions.   The patient was advised to call back or seek an in-person evaluation if the symptoms worsen or if the condition fails to improve as anticipated.  I provided 12 minutes of non-face-to-face time during this encounter.   Lyndal Pulley, DO

## 2020-11-01 DIAGNOSIS — L905 Scar conditions and fibrosis of skin: Secondary | ICD-10-CM | POA: Diagnosis not present

## 2020-11-01 DIAGNOSIS — C44529 Squamous cell carcinoma of skin of other part of trunk: Secondary | ICD-10-CM | POA: Diagnosis not present

## 2020-12-10 DIAGNOSIS — I10 Essential (primary) hypertension: Secondary | ICD-10-CM | POA: Diagnosis not present

## 2020-12-10 DIAGNOSIS — R7989 Other specified abnormal findings of blood chemistry: Secondary | ICD-10-CM | POA: Diagnosis not present

## 2020-12-10 DIAGNOSIS — E785 Hyperlipidemia, unspecified: Secondary | ICD-10-CM | POA: Diagnosis not present

## 2020-12-10 DIAGNOSIS — D75839 Thrombocytosis, unspecified: Secondary | ICD-10-CM | POA: Diagnosis not present

## 2020-12-13 DIAGNOSIS — D75839 Thrombocytosis, unspecified: Secondary | ICD-10-CM | POA: Diagnosis not present

## 2020-12-14 DIAGNOSIS — D75839 Thrombocytosis, unspecified: Secondary | ICD-10-CM | POA: Diagnosis not present

## 2020-12-15 DIAGNOSIS — D75839 Thrombocytosis, unspecified: Secondary | ICD-10-CM | POA: Diagnosis not present

## 2020-12-16 DIAGNOSIS — D75839 Thrombocytosis, unspecified: Secondary | ICD-10-CM | POA: Diagnosis not present

## 2020-12-24 DIAGNOSIS — D75839 Thrombocytosis, unspecified: Secondary | ICD-10-CM | POA: Diagnosis not present

## 2021-01-12 DIAGNOSIS — D75839 Thrombocytosis, unspecified: Secondary | ICD-10-CM | POA: Diagnosis not present

## 2021-01-18 DIAGNOSIS — H02423 Myogenic ptosis of bilateral eyelids: Secondary | ICD-10-CM | POA: Diagnosis not present

## 2021-01-18 DIAGNOSIS — H53453 Other localized visual field defect, bilateral: Secondary | ICD-10-CM | POA: Diagnosis not present

## 2021-01-18 DIAGNOSIS — H53483 Generalized contraction of visual field, bilateral: Secondary | ICD-10-CM | POA: Diagnosis not present

## 2021-01-18 DIAGNOSIS — H02413 Mechanical ptosis of bilateral eyelids: Secondary | ICD-10-CM | POA: Diagnosis not present

## 2021-01-18 DIAGNOSIS — H57813 Brow ptosis, bilateral: Secondary | ICD-10-CM | POA: Diagnosis not present

## 2021-01-18 DIAGNOSIS — H0279 Other degenerative disorders of eyelid and periocular area: Secondary | ICD-10-CM | POA: Diagnosis not present

## 2021-01-18 DIAGNOSIS — H02834 Dermatochalasis of left upper eyelid: Secondary | ICD-10-CM | POA: Diagnosis not present

## 2021-01-18 DIAGNOSIS — H02831 Dermatochalasis of right upper eyelid: Secondary | ICD-10-CM | POA: Diagnosis not present

## 2021-02-01 DIAGNOSIS — J9611 Chronic respiratory failure with hypoxia: Secondary | ICD-10-CM | POA: Diagnosis not present

## 2021-02-01 DIAGNOSIS — N289 Disorder of kidney and ureter, unspecified: Secondary | ICD-10-CM | POA: Diagnosis not present

## 2021-02-01 DIAGNOSIS — I10 Essential (primary) hypertension: Secondary | ICD-10-CM | POA: Diagnosis not present

## 2021-02-01 DIAGNOSIS — I700 Atherosclerosis of aorta: Secondary | ICD-10-CM | POA: Diagnosis not present

## 2021-02-01 DIAGNOSIS — R300 Dysuria: Secondary | ICD-10-CM | POA: Diagnosis not present

## 2021-02-01 DIAGNOSIS — J410 Simple chronic bronchitis: Secondary | ICD-10-CM | POA: Diagnosis not present

## 2021-02-01 DIAGNOSIS — N3000 Acute cystitis without hematuria: Secondary | ICD-10-CM | POA: Diagnosis not present

## 2021-02-01 DIAGNOSIS — J432 Centrilobular emphysema: Secondary | ICD-10-CM | POA: Diagnosis not present

## 2021-08-01 ENCOUNTER — Other Ambulatory Visit: Payer: Self-pay | Admitting: Obstetrics and Gynecology

## 2021-08-01 DIAGNOSIS — R928 Other abnormal and inconclusive findings on diagnostic imaging of breast: Secondary | ICD-10-CM

## 2021-08-16 ENCOUNTER — Other Ambulatory Visit: Payer: Medicare HMO

## 2021-08-18 ENCOUNTER — Ambulatory Visit
Admission: RE | Admit: 2021-08-18 | Discharge: 2021-08-18 | Disposition: A | Discharge status: Home / Self Care | Payer: Medicare HMO | Source: Ambulatory Visit | Attending: Obstetrics and Gynecology | Admitting: Obstetrics and Gynecology

## 2021-08-18 ENCOUNTER — Ambulatory Visit: Payer: Medicare HMO

## 2021-08-18 DIAGNOSIS — R928 Other abnormal and inconclusive findings on diagnostic imaging of breast: Secondary | ICD-10-CM

## 2023-01-01 IMAGING — DX DG HIP (WITH OR WITHOUT PELVIS) 2-3V*L*
3 series · 3 of 3 positions shown · non-contrast
Comparison: 09/07/2018

CLINICAL DATA: Left hip pain. Generalized weakness in the left hip.
Hip replacement 2 years ago.

EXAM:
DG HIP (WITH OR WITHOUT PELVIS) 2-3V LEFT

[pelvis ap]
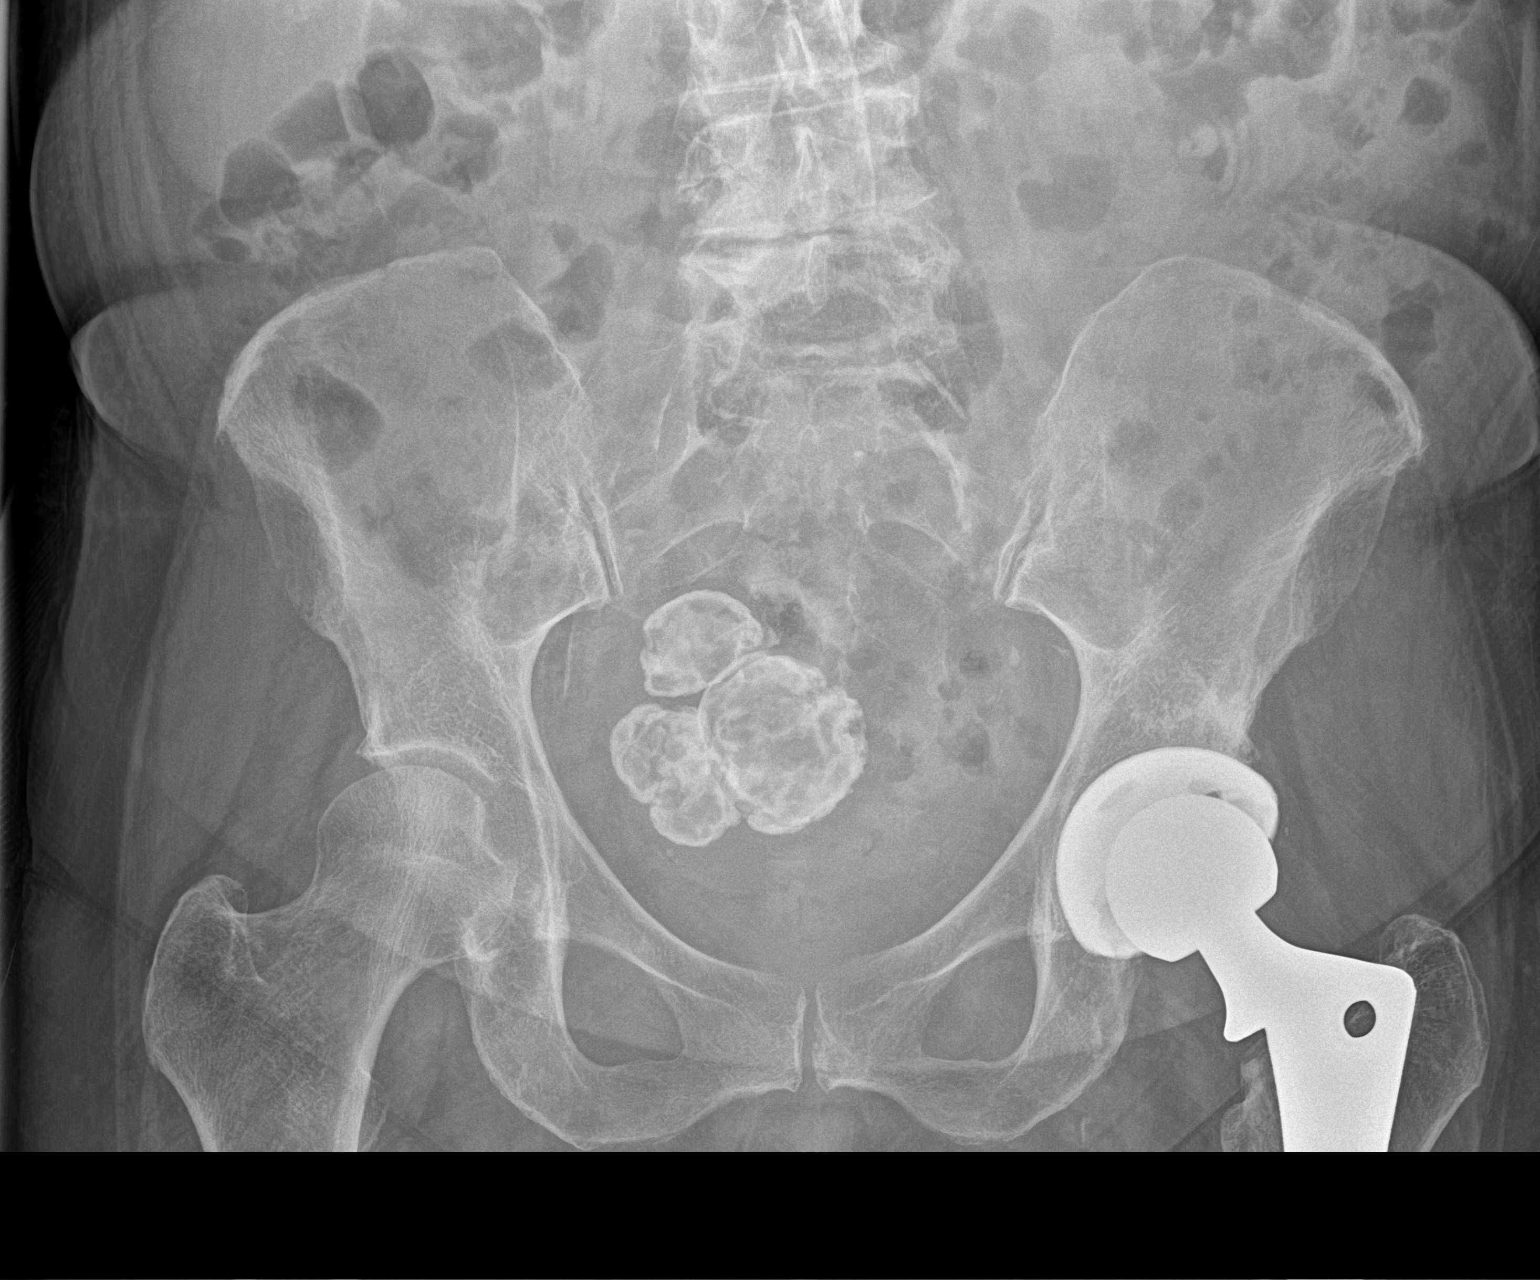

[hip ap]
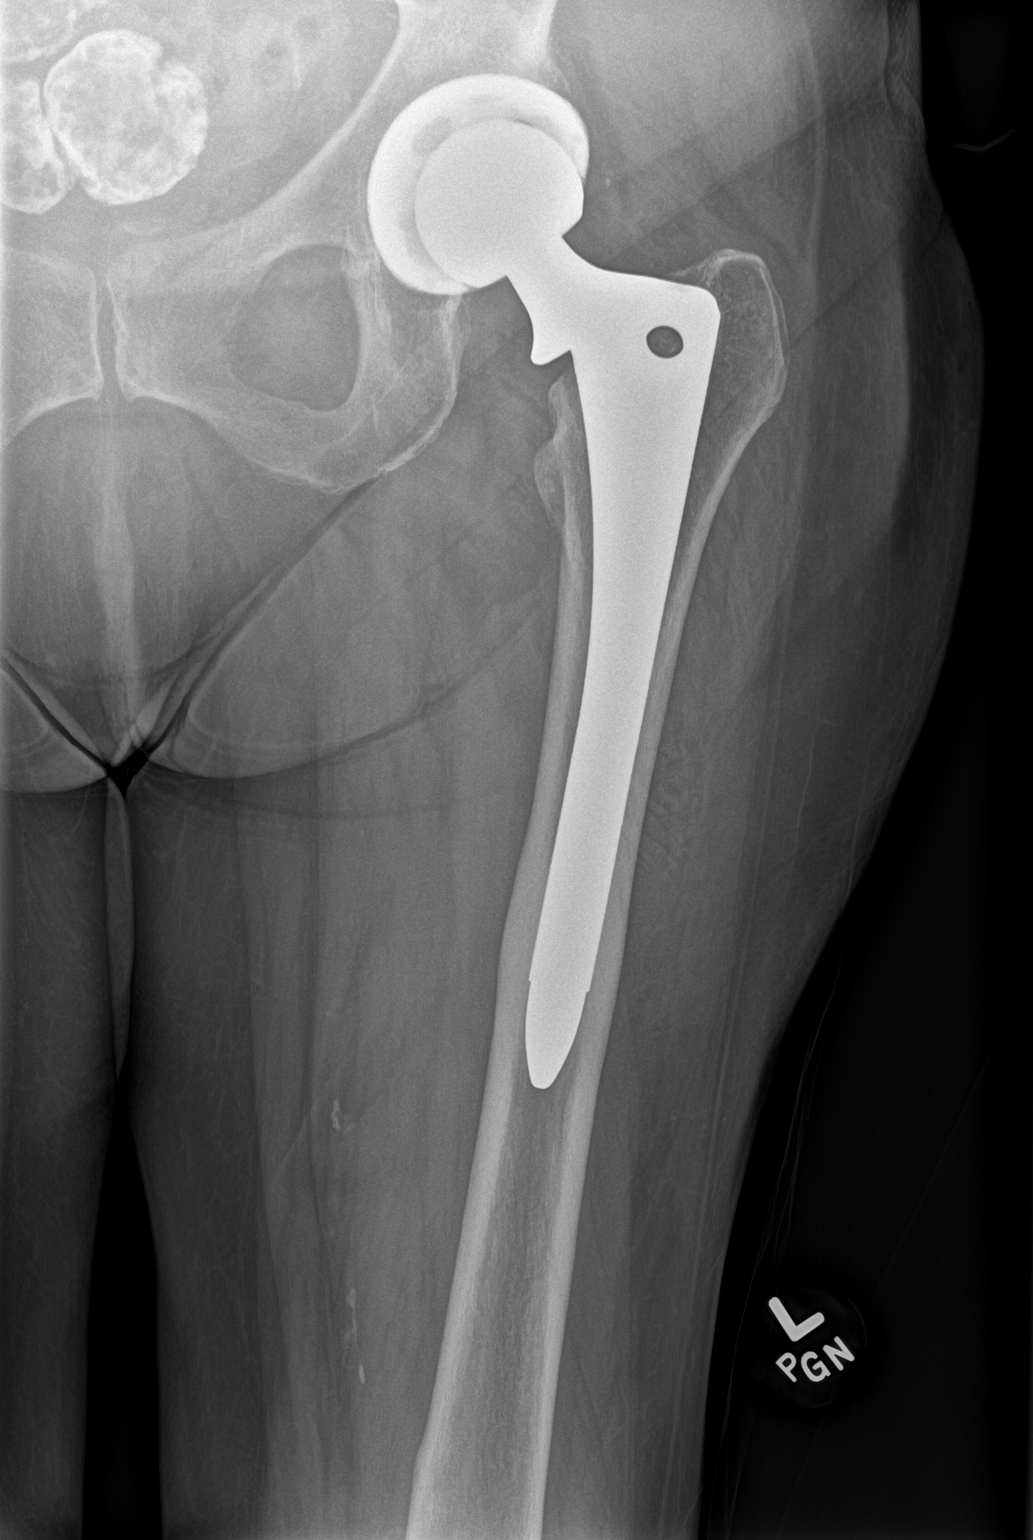

[hip frog leg]
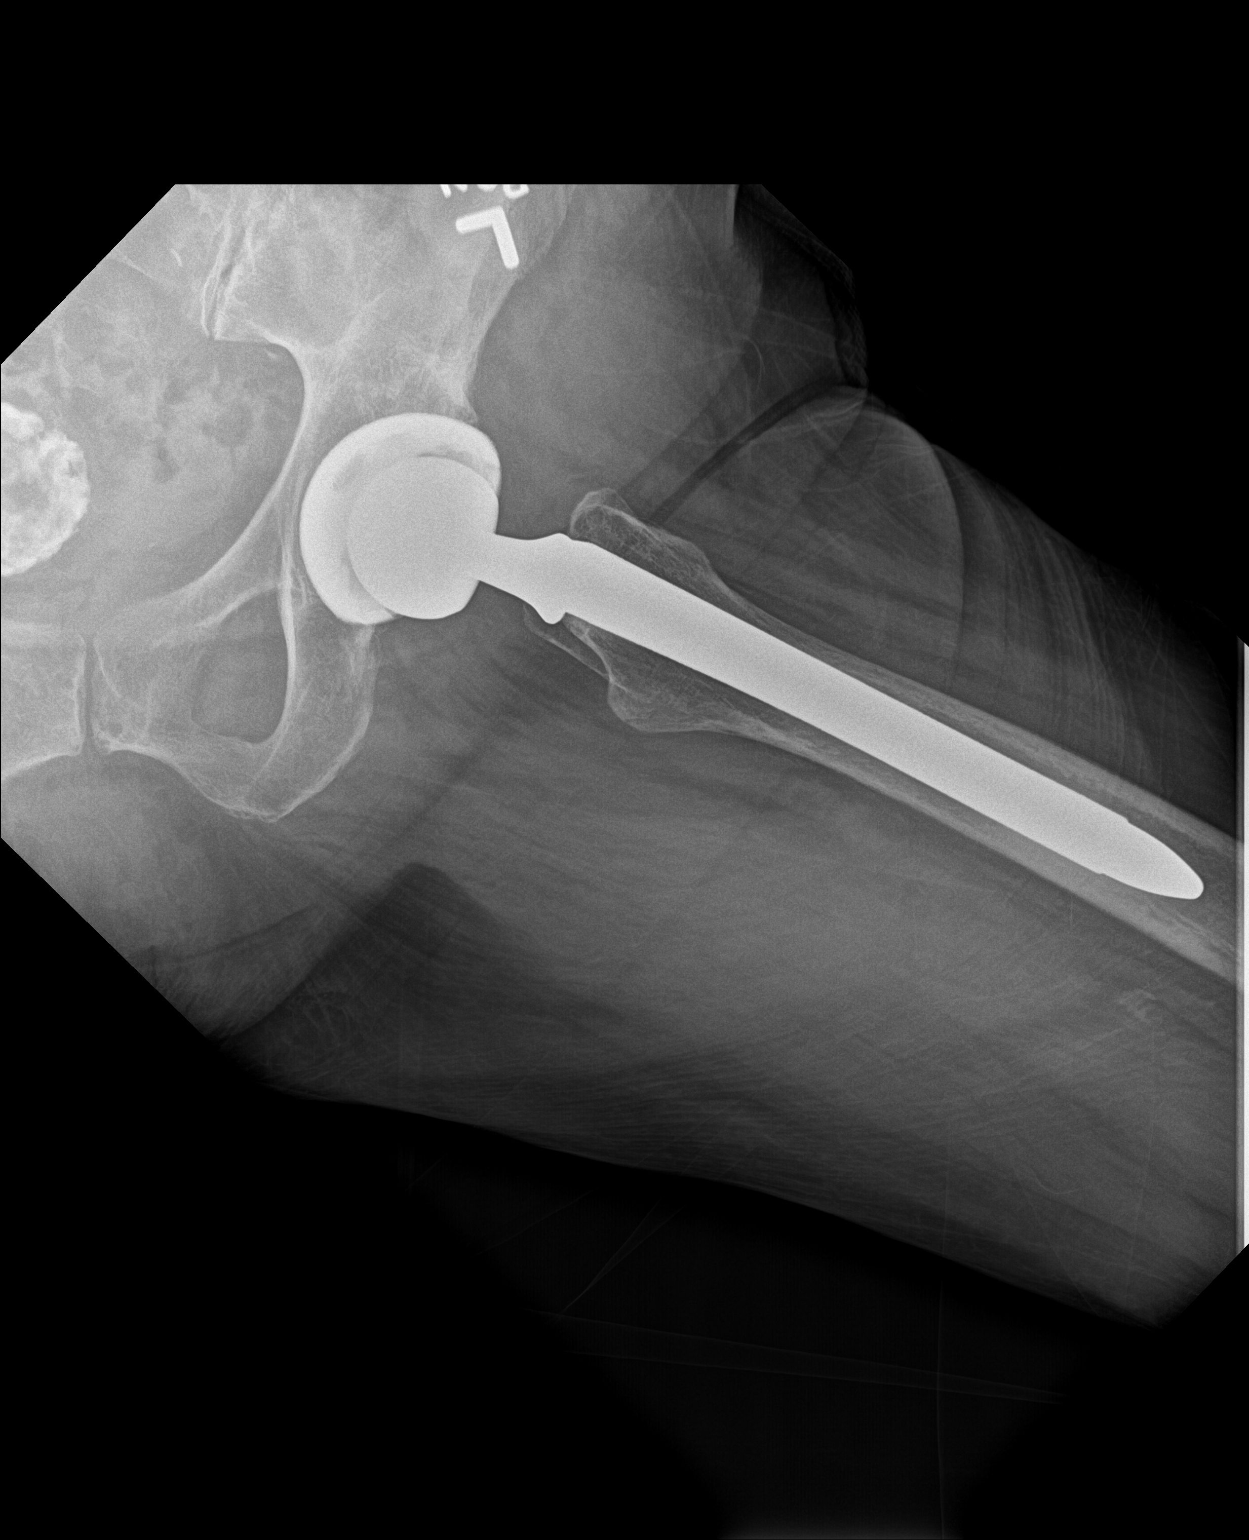

[3 of 3 positions shown; findings below may reference images not displayed]

FINDINGS: Left total hip arthroplasty using non cemented components.
Components appear well seated without change in position since prior
study. No dislocation or periprosthetic fracture identified. The
pelvis appears intact. No acute fracture or dislocation.
Degenerative changes in the lower lumbar spine and right hip.
Rounded calcifications in the pelvis likely representing calcified
fibroids. No change since prior study.
IMPRESSION: Left total hip arthroplasty. Components appear well seated. No acute
bony abnormalities.

## 2023-02-12 IMAGING — NM NM BONE 3 PHASE
10 series · 20 of 20 positions shown · non-contrast
Comparison: None. Findings are correlated with left hip radiographs
of 04/05/2020

CLINICAL DATA: Left hip pain, recent fall, left total hip
arthroplasty

EXAM:
NUCLEAR MEDICINE 3-PHASE BONE SCAN
TECHNIQUE: Radionuclide angiographic images, immediate static blood pool
images, and 3-hour delayed static images were obtained of the hips
bilateral after intravenous injection of radiopharmaceutical.
RADIOPHARMACEUTICALS:  21.2 mCi Tc-UUm MDP IV

[Series 1: flow · 2.07mm/px · 6 of 48 frames shown (1 of 2)]
[frame 5/48  full-range]
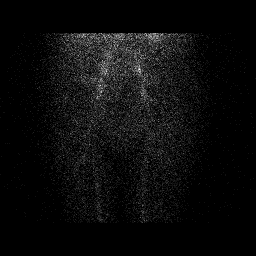
[frame 13/48  full-range]
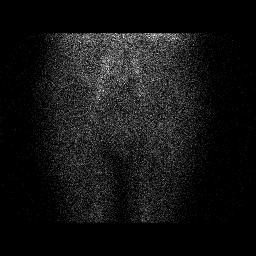
[frame 21/48  full-range]
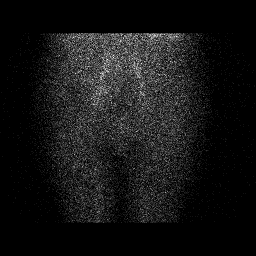
[frame 29/48  full-range]
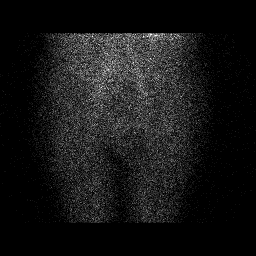
[frame 37/48  full-range]
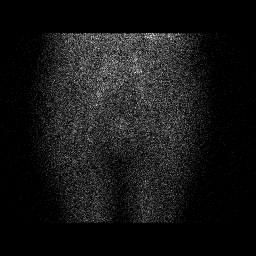
[frame 45/48  full-range]
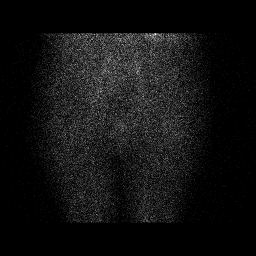

[Series 1: flow · 2.07mm/px · 6 of 48 frames shown (2 of 2)]
[frame 5/48  full-range]
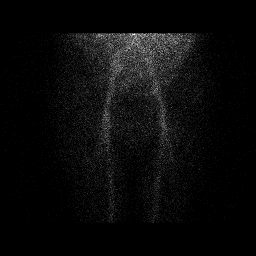
[frame 13/48  full-range]
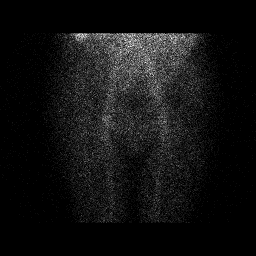
[frame 21/48  full-range]
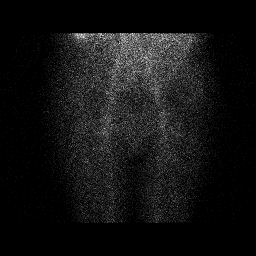
[frame 29/48  full-range]
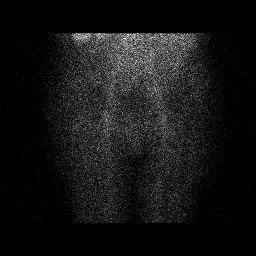
[frame 37/48  full-range]
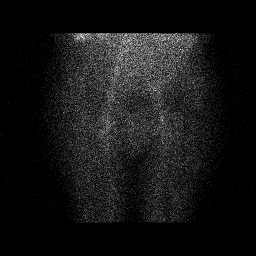
[frame 45/48  full-range]
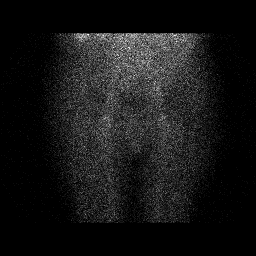

[Series 2: blood pool · 2.07mm/px · 1 of 1 slices shown (1 of 2)]
[im 1/1]
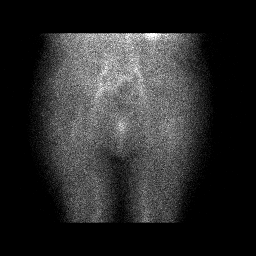

[Series 2: blood pool · 2.07mm/px · 1 of 1 slices shown (2 of 2)]
[im 1/1]
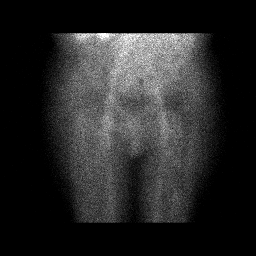

[Series 3: lat bp · 2.07mm/px · 1 of 1 slices shown (1 of 2)]
[im 1/1]
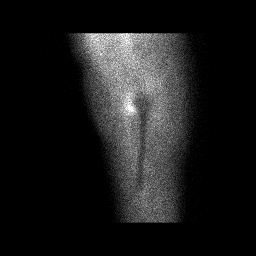

[Series 3: lat bp · 2.07mm/px · 1 of 1 slices shown (2 of 2)]
[im 1/1]
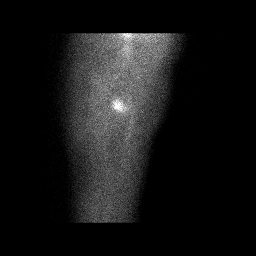

[Series 4: delay · delayed · 2.07mm/px · 1 of 1 slices shown (1 of 4)]
[im 1/1]
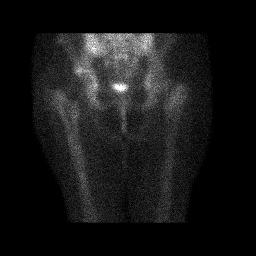

[Series 4: delay · delayed · 2.07mm/px · 1 of 1 slices shown (2 of 4)]
[im 1/1]
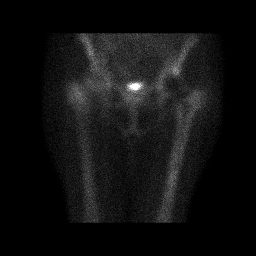

[Series 5: delay · delayed · 2.07mm/px · 1 of 1 slices shown (3 of 4)]
[im 1/1]
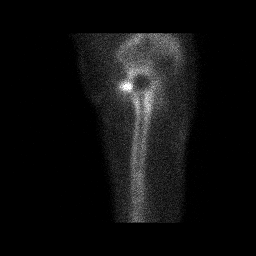

[Series 5: delay · delayed · 2.07mm/px · 1 of 1 slices shown (4 of 4)]
[im 1/1]
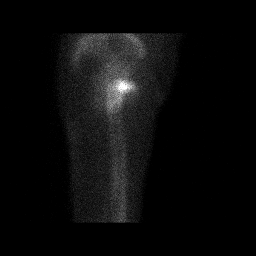

[20 of 20 positions shown; findings below may reference images not displayed]

FINDINGS: Vascular phase: No asymmetric perfusion of the hips.

Blood pool phase: No focal hyperemia involving the visualized pelvis
and hips.

Delayed phase: Defect related to left total hip arthroplasty
identified. Mild asymmetric uptake within the left acetabular region
surrounding the arthroplasty component is likely reactive in nature
and within range of expectation. No abnormal focal uptake of
radiotracer identified. Normal soft tissue distribution.
IMPRESSION: Unremarkable examination post left total hip arthroplasty. No
evidence of radio occult fracture or loosening.
